# Patient Record
Sex: Female | Born: 1955 | Race: White | Hispanic: No | Marital: Married | Smoking: Never smoker
Health system: Southern US, Community
[De-identification: ages and names within clinical notes are randomized; demographics above are authoritative.]

## PROBLEM LIST (undated history)

## (undated) DIAGNOSIS — R7303 Prediabetes: Secondary | ICD-10-CM

## (undated) DIAGNOSIS — K5792 Diverticulitis of intestine, part unspecified, without perforation or abscess without bleeding: Secondary | ICD-10-CM

## (undated) DIAGNOSIS — H8101 Meniere's disease, right ear: Secondary | ICD-10-CM

## (undated) DIAGNOSIS — R0600 Dyspnea, unspecified: Secondary | ICD-10-CM

## (undated) DIAGNOSIS — I10 Essential (primary) hypertension: Secondary | ICD-10-CM

## (undated) DIAGNOSIS — F419 Anxiety disorder, unspecified: Secondary | ICD-10-CM

## (undated) DIAGNOSIS — J189 Pneumonia, unspecified organism: Secondary | ICD-10-CM

## (undated) DIAGNOSIS — M1990 Unspecified osteoarthritis, unspecified site: Secondary | ICD-10-CM

## (undated) DIAGNOSIS — I499 Cardiac arrhythmia, unspecified: Secondary | ICD-10-CM

## (undated) HISTORY — PX: ABDOMINAL HYSTERECTOMY: SHX81

## (undated) HISTORY — PX: URETHRAL DILATION: SUR417

## (undated) HISTORY — PX: DIAGNOSTIC LAPAROSCOPY: SUR761

## (undated) HISTORY — PX: TONSILLECTOMY: SUR1361

## (undated) HISTORY — PX: APPENDECTOMY: SHX54

---

## 1999-04-11 ENCOUNTER — Other Ambulatory Visit: Admission: RE | Admit: 1999-04-11 | Discharge: 1999-04-11 | Payer: Self-pay | Admitting: *Deleted

## 1999-04-18 ENCOUNTER — Encounter: Admission: RE | Admit: 1999-04-18 | Discharge: 1999-04-18 | Payer: Self-pay | Admitting: Family Medicine

## 1999-04-18 ENCOUNTER — Encounter: Payer: Self-pay | Admitting: Family Medicine

## 2000-04-10 ENCOUNTER — Other Ambulatory Visit: Admission: RE | Admit: 2000-04-10 | Discharge: 2000-04-10 | Payer: Self-pay | Admitting: *Deleted

## 2000-11-04 ENCOUNTER — Encounter: Payer: Self-pay | Admitting: Family Medicine

## 2000-11-04 ENCOUNTER — Encounter: Admission: RE | Admit: 2000-11-04 | Discharge: 2000-11-04 | Payer: Self-pay | Admitting: Family Medicine

## 2002-12-23 ENCOUNTER — Encounter: Payer: Self-pay | Admitting: Gynecology

## 2002-12-23 ENCOUNTER — Other Ambulatory Visit: Admission: RE | Admit: 2002-12-23 | Discharge: 2002-12-23 | Payer: Self-pay | Admitting: Gynecology

## 2002-12-23 ENCOUNTER — Encounter: Admission: RE | Admit: 2002-12-23 | Discharge: 2002-12-23 | Payer: Self-pay | Admitting: Gynecology

## 2004-09-11 ENCOUNTER — Other Ambulatory Visit: Admission: RE | Admit: 2004-09-11 | Discharge: 2004-09-11 | Payer: Self-pay | Admitting: Gynecology

## 2005-01-07 ENCOUNTER — Ambulatory Visit: Payer: Self-pay | Admitting: Family Medicine

## 2005-11-14 ENCOUNTER — Ambulatory Visit: Payer: Self-pay | Admitting: Internal Medicine

## 2006-01-05 ENCOUNTER — Ambulatory Visit: Payer: Self-pay | Admitting: Family Medicine

## 2006-07-28 ENCOUNTER — Ambulatory Visit: Payer: Self-pay | Admitting: Family Medicine

## 2006-08-24 ENCOUNTER — Other Ambulatory Visit: Admission: RE | Admit: 2006-08-24 | Discharge: 2006-08-24 | Payer: Self-pay | Admitting: Gynecology

## 2007-03-16 ENCOUNTER — Telehealth: Payer: Self-pay | Admitting: Family Medicine

## 2007-04-02 ENCOUNTER — Telehealth (INDEPENDENT_AMBULATORY_CARE_PROVIDER_SITE_OTHER): Payer: Self-pay | Admitting: *Deleted

## 2007-10-13 ENCOUNTER — Encounter: Payer: Self-pay | Admitting: Internal Medicine

## 2007-10-20 ENCOUNTER — Encounter: Payer: Self-pay | Admitting: Internal Medicine

## 2007-10-22 ENCOUNTER — Other Ambulatory Visit: Admission: RE | Admit: 2007-10-22 | Discharge: 2007-10-22 | Payer: Self-pay | Admitting: Gynecology

## 2013-10-17 ENCOUNTER — Other Ambulatory Visit: Payer: Self-pay

## 2013-10-17 DIAGNOSIS — Z1231 Encounter for screening mammogram for malignant neoplasm of breast: Secondary | ICD-10-CM

## 2013-10-31 ENCOUNTER — Ambulatory Visit
Admission: RE | Admit: 2013-10-31 | Discharge: 2013-10-31 | Disposition: A | Discharge status: Home / Self Care | Payer: BC Managed Care – PPO | Source: Ambulatory Visit

## 2013-10-31 DIAGNOSIS — Z1231 Encounter for screening mammogram for malignant neoplasm of breast: Secondary | ICD-10-CM

## 2015-06-06 ENCOUNTER — Emergency Department (HOSPITAL_COMMUNITY): Payer: Managed Care, Other (non HMO)

## 2015-06-06 ENCOUNTER — Observation Stay (HOSPITAL_COMMUNITY)
Admission: EM | Admit: 2015-06-06 | Discharge: 2015-06-06 | Disposition: A | Discharge status: Home / Self Care | Payer: Managed Care, Other (non HMO) | Attending: Obstetrics and Gynecology | Admitting: Obstetrics and Gynecology

## 2015-06-06 ENCOUNTER — Encounter (HOSPITAL_COMMUNITY): Payer: Self-pay | Admitting: Neurology

## 2015-06-06 DIAGNOSIS — Z7951 Long term (current) use of inhaled steroids: Secondary | ICD-10-CM | POA: Diagnosis not present

## 2015-06-06 DIAGNOSIS — R7309 Other abnormal glucose: Secondary | ICD-10-CM | POA: Insufficient documentation

## 2015-06-06 DIAGNOSIS — Z79899 Other long term (current) drug therapy: Secondary | ICD-10-CM | POA: Insufficient documentation

## 2015-06-06 DIAGNOSIS — K219 Gastro-esophageal reflux disease without esophagitis: Secondary | ICD-10-CM | POA: Diagnosis not present

## 2015-06-06 DIAGNOSIS — Z7982 Long term (current) use of aspirin: Secondary | ICD-10-CM | POA: Insufficient documentation

## 2015-06-06 DIAGNOSIS — R079 Chest pain, unspecified: Secondary | ICD-10-CM | POA: Diagnosis not present

## 2015-06-06 DIAGNOSIS — E7800 Pure hypercholesterolemia, unspecified: Secondary | ICD-10-CM | POA: Insufficient documentation

## 2015-06-06 DIAGNOSIS — K210 Gastro-esophageal reflux disease with esophagitis: Secondary | ICD-10-CM

## 2015-06-06 DIAGNOSIS — M940 Chondrocostal junction syndrome [Tietze]: Secondary | ICD-10-CM | POA: Diagnosis not present

## 2015-06-06 LAB — CBC WITH DIFFERENTIAL/PLATELET
Basophils Absolute: 0 10*3/uL (ref 0.0–0.1)
Basophils Relative: 1 %
Eosinophils Absolute: 0.1 10*3/uL (ref 0.0–0.7)
Eosinophils Relative: 2 %
HCT: 41.8 % (ref 36.0–46.0)
HEMOGLOBIN: 14.3 g/dL (ref 12.0–15.0)
LYMPHS ABS: 2.2 10*3/uL (ref 0.7–4.0)
LYMPHS PCT: 43 %
MCH: 30.4 pg (ref 26.0–34.0)
MCHC: 34.2 g/dL (ref 30.0–36.0)
MCV: 88.9 fL (ref 78.0–100.0)
MONO ABS: 0.3 10*3/uL (ref 0.1–1.0)
MONOS PCT: 6 %
NEUTROS PCT: 48 %
Neutro Abs: 2.4 10*3/uL (ref 1.7–7.7)
Platelets: 232 10*3/uL (ref 150–400)
RBC: 4.7 MIL/uL (ref 3.87–5.11)
RDW: 13.2 % (ref 11.5–15.5)
WBC: 5 10*3/uL (ref 4.0–10.5)

## 2015-06-06 LAB — COMPREHENSIVE METABOLIC PANEL
ALK PHOS: 64 U/L (ref 38–126)
ALT: 29 U/L (ref 14–54)
ANION GAP: 11 (ref 5–15)
AST: 26 U/L (ref 15–41)
Albumin: 3.9 g/dL (ref 3.5–5.0)
BILIRUBIN TOTAL: 0.7 mg/dL (ref 0.3–1.2)
BUN: 12 mg/dL (ref 6–20)
CALCIUM: 9.3 mg/dL (ref 8.9–10.3)
CO2: 25 mmol/L (ref 22–32)
Chloride: 103 mmol/L (ref 101–111)
Creatinine, Ser: 0.88 mg/dL (ref 0.44–1.00)
GFR calc Af Amer: >60 mL/min (ref >60)
GLUCOSE: 103 mg/dL — AB (ref 65–99)
POTASSIUM: 4.5 mmol/L (ref 3.5–5.1)
Sodium: 139 mmol/L (ref 135–145)
TOTAL PROTEIN: 6.8 g/dL (ref 6.5–8.1)

## 2015-06-06 LAB — I-STAT TROPONIN, ED
TROPONIN I, POC: 0 ng/mL (ref 0.00–0.08)
TROPONIN I, POC: 0 ng/mL (ref 0.00–0.08)

## 2015-06-06 MED ORDER — NITROGLYCERIN 0.4 MG SL SUBL
0.4000 mg | SUBLINGUAL_TABLET | SUBLINGUAL | Status: DC | PRN
  Filled 2015-06-06: qty 1

## 2015-06-06 MED ORDER — ASPIRIN 81 MG PO CHEW
324.0000 mg | CHEWABLE_TABLET | Freq: Once | ORAL | Status: AC
  Administered 2015-06-06: 324 mg via ORAL
  Filled 2015-06-06: qty 4

## 2015-06-06 NOTE — Consult Note (Signed)
[facility] Medical Consultation  Dawn Haley RUE:[id number] DOB: 1955/10/14 DOA: 06/06/2015 PCP: No primary care provider on file.   Requesting physician: Dr Dalene Seltzer - [facility] Date of consultation: 06/06/15 Reason for consultation: CP  Impression/Recommendations Active Problems:   Chest pain    CP: Suspect musculoskeletal etiology. Heart score 2. Pain is exactly reproducible on my palpation of chest wall. There may be some underlying additional GI component given worsening of symptoms after coffee use with endorse history of reflux. Suspect that this morning's episode may have caused esophageal irritation with spasm. Patient to avoid caffeine use. Patient can consider using Zantac or Prilosec for symptomatically relief. Additionally patient sleeps on her side which may also caused costochondritis leading to her current chest wall pain. Patient with family history of heart disease in father and heart attack and stroke. Husband sees cardiologist and patient has agreed to follow-up with cardiology in 1-2 weeks for further management. Patient stable and safe for discharge. While NSAIDs may make GERD worse pt can consider using ibuprofen 600 Q6 for 7 days for costochondral chest pain.  GERD: Intermittent reflux. Worsened by caffeine use. Patient to use H2 blocker or PPI for relief. May consider getting GI cocktail prior to leaving the emergency room.  I will followup again tomorrow. Please contact me if I can be of assistance in the meanwhile. Thank you for this consultation.  Chief Complaint: Chest pain  HPI:  Chest pain. Episodic. Last less than 1 minute. Started shortly after drinking coffee this morning which patient typically tries to avoid due to this causing reflux. Pain was in the left chest wall with radiation to her jaw. Episodes would come on at random. Nonexertional. Nothing makes the symptoms better. Patient sleeps on her left side. Denies any recent change in exercise habits.  Denies any history of significant hypercholesterolemia other than slight elevation with recommended dietary and exercise changes by primary care physician. Denies any recent fevers, nausea, vomiting, URI symptoms including runny nose cough congestion sore throat. Denies dysuria frequency back pain.  Review of Systems:  PEr HPi w/ all other systems negative.    History reviewed. No pertinent past medical history. Past Surgical History  Procedure Laterality Date  . Abdominal hysterectomy     Social History:  reports that she has never smoked. She does not have any smokeless tobacco history on file. She reports that she drinks alcohol. Her drug history is not on file.  Allergies  Allergen Reactions  . Codeine Hives, Rash and Other (See Comments)    Intensifies pain instead of relieving   Family History  Problem Relation Age of Onset  . Heart disease Father   . Stroke Father     Prior to Admission medications   Medication Sig Start Date End Date Taking? Authorizing Provider  ASHWAGANDHA PO Take 1 Dose by mouth daily.   Yes Historical Provider, MD  aspirin EC 81 MG tablet Take 324 mg by mouth once.   Yes Historical Provider, MD  cholecalciferol (VITAMIN D) 1000 units tablet Take 1,000 Units by mouth daily.   Yes Historical Provider, MD  fluticasone (FLONASE) 50 MCG/ACT nasal spray Place into both nostrils daily.   Yes Historical Provider, MD  GARLIC PO Take 1 Dose by mouth daily.   Yes Historical Provider, MD  LORazepam (ATIVAN) 0.5 MG tablet Take 0.5 mg by mouth at bedtime as needed for sleep.  05/30/15  Yes Historical Provider, MD  Multiple Vitamin (MULTIVITAMIN WITH MINERALS) TABS tablet Take 1 tablet by  mouth daily.   Yes Historical Provider, MD  Omega-3 Fatty Acids (FISH OIL) 1000 MG CAPS Take 1,000 mg by mouth daily.   Yes Historical Provider, MD  saccharomyces boulardii (FLORASTOR) 250 MG capsule Take 250 mg by mouth daily.   Yes Historical Provider, MD   Physical Exam: Blood  pressure 125/67, pulse 65, temperature 98.4 F (36.9 C), temperature source Oral, resp. rate 13, SpO2 97 %. Filed Vitals:   06/06/15 1430 06/06/15 1445  BP: 138/73 125/67  Pulse: 71 65  Temp:    Resp: 13 13   Physical Exam  Constitutional: oriented to person, place, and time. appears well-developed and well-nourished. No distress.  HENT:  Head: Normocephalic and atraumatic.  Eyes: EOMI. PERRL.  Neck: Normal range of motion.  Cardiovascular: RRR, no m/r/g, 2+ distal pulses, reproducible CP on palpation of L chest wall in costochondral border region Pulmonary/Chest: Effort normal and breath sounds normal. No respiratory distress.  Abdominal: Soft. Bowel sounds are normal. NonTTP, no distension.  Musculoskeletal: Normal range of motion. Non ttp, no effusion.  Neurological: alert and oriented to person, place, and time.  Skin: Skin is warm. No rash noted. non diaphoretic.  Psychiatric: normal mood and affect. behavior is normal. Judgment and thought content normal.    Labs on Admission:  Basic Metabolic Panel:  Recent Labs Lab 06/06/15 0953  NA 139  K 4.5  CL 103  CO2 25  GLUCOSE 103*  BUN 12  CREATININE 0.88  CALCIUM 9.3   Liver Function Tests:  Recent Labs Lab 06/06/15 0953  AST 26  ALT 29  ALKPHOS 64  BILITOT 0.7  PROT 6.8  ALBUMIN 3.9   No results for input(s): LIPASE, AMYLASE in the last 168 hours. No results for input(s): AMMONIA in the last 168 hours. CBC:  Recent Labs Lab 06/06/15 0953  WBC 5.0  NEUTROABS 2.4  HGB 14.3  HCT 41.8  MCV 88.9  PLT 232   Cardiac Enzymes: No results for input(s): CKTOTAL, CKMB, CKMBINDEX, TROPONINI in the last 168 hours. BNP: Invalid input(s): POCBNP CBG: No results for input(s): GLUCAP in the last 168 hours.  Radiological Exams on Admission: Dg Chest 2 View  06/06/2015  CLINICAL DATA:  Left-sided chest pain, elbow pain, left on tingling and lightheadedness EXAM: CHEST  2 VIEW COMPARISON:  None FINDINGS: There  is a focal opacity in the lingula. There is no focal consolidation. There is no pleural effusion or pneumothorax. The heart and mediastinal contours are unremarkable. The osseous structures are unremarkable. IMPRESSION: Focal opacity in the lingula which is likely a confluence of structures, but a soft tissue lung nodule cannot be entirely excluded. Recommend follow-up chest x-ray in 6-8 weeks. Electronically Signed   By: Elige Ko   On: 06/06/2015 10:04    EKG: Independently reviewed.  Normal sinus rhythm,  Manju Kulkarni J [facility]   If 7PM-7AM, please contact night-coverage [email or web address] Password [email or web address] 06/06/2015, 4:00 PM

## 2015-06-06 NOTE — ED Notes (Signed)
Nitro offered pt refused

## 2015-06-06 NOTE — ED Notes (Signed)
Pt reports onset of left sided cp, tingling in her left jaw, and pressure between shoulder blades while standing in her kitchen. Denies cardiac hx .

## 2015-06-06 NOTE — ED Provider Notes (Signed)
CSN: [id number]     Arrival date & time 06/06/15  4098 History   First MD Initiated Contact with Patient 06/06/15 [phone]     Chief Complaint  Patient presents with  . Chest Pain     (Consider location/radiation/quality/duration/timing/severity/associated sxs/prior Treatment) HPI Comments: BMI 30.1 Father MI not sure if greater than or less tahn 55 Borderline choleseterol Borderline DM Under a lot of stress recently, mom died 2 weeks ago   Patient is a 60 y.o. female presenting with chest pain.  Chest Pain Pain location:  L chest Pain quality comment:  "poking" like pushing in with finger, spot upper chest, would radiate and lower spot with radiation Pain radiates to:  L jaw, L arm and L shoulder (left arm tingling) Pain severity:  Mild Onset quality:  Sudden Duration:  2 hours Timing:  Intermittent Progression:  Waxing and waning Chronicity:  New Ineffective treatments: not exertional. Associated symptoms: no abdominal pain, no back pain, no cough, no diaphoresis, no fever, no headache, no nausea, no numbness, no shortness of breath, no syncope, not vomiting and no weakness   Risk factors: high cholesterol   Risk factors: no coronary artery disease (pre-DM), no diabetes mellitus, no hypertension, no immobilization, no prior DVT/PE and no smoking   Risk factors comment:  Father MI 58s   History reviewed. No pertinent past medical history. Past Surgical History  Procedure Laterality Date  . Abdominal hysterectomy     Family History  Problem Relation Age of Onset  . Heart disease Father   . Stroke Father    Social History  Substance Use Topics  . Smoking status: Never Smoker   . Smokeless tobacco: None  . Alcohol Use: Yes   OB History    No data available     Review of Systems  Constitutional: Negative for fever and diaphoresis.  HENT: Negative for sore throat.   Eyes: Negative for visual disturbance.  Respiratory: Negative for cough and shortness of breath.    Cardiovascular: Positive for chest pain. Negative for leg swelling and syncope.  Gastrointestinal: Negative for nausea, vomiting and abdominal pain.  Genitourinary: Negative for difficulty urinating.  Musculoskeletal: Negative for back pain and neck pain.  Skin: Negative for rash.  Neurological: Negative for syncope, weakness, numbness and headaches.      Allergies  Codeine  Home Medications   Prior to Admission medications   Medication Sig Start Date End Date Taking? Authorizing Provider  ASHWAGANDHA PO Take 1 Dose by mouth daily.   Yes Historical Provider, MD  aspirin EC 81 MG tablet Take 324 mg by mouth once.   Yes Historical Provider, MD  cholecalciferol (VITAMIN D) 1000 units tablet Take 1,000 Units by mouth daily.   Yes Historical Provider, MD  fluticasone (FLONASE) 50 MCG/ACT nasal spray Place into both nostrils daily.   Yes Historical Provider, MD  GARLIC PO Take 1 Dose by mouth daily.   Yes Historical Provider, MD  LORazepam (ATIVAN) 0.5 MG tablet Take 0.5 mg by mouth at bedtime as needed for sleep.  05/30/15  Yes Historical Provider, MD  Multiple Vitamin (MULTIVITAMIN WITH MINERALS) TABS tablet Take 1 tablet by mouth daily.   Yes Historical Provider, MD  Omega-3 Fatty Acids (FISH OIL) 1000 MG CAPS Take 1,000 mg by mouth daily.   Yes Historical Provider, MD  saccharomyces boulardii (FLORASTOR) 250 MG capsule Take 250 mg by mouth daily.   Yes Historical Provider, MD   BP 104/80 mmHg  Pulse 63  Temp(Src) 98.4 F (  36.9 C) (Oral)  Resp 14  SpO2 98% Physical Exam  Constitutional: She is oriented to person, place, and time. She appears well-developed and well-nourished. No distress.  HENT:  Head: Normocephalic and atraumatic.  Eyes: Conjunctivae and EOM are normal.  Neck: Normal range of motion.  Cardiovascular: Normal rate, regular rhythm, normal heart sounds and intact distal pulses.  Exam reveals no gallop and no friction rub.   No murmur heard. Pulmonary/Chest: Effort  normal and breath sounds normal. No respiratory distress. She has no wheezes. She has no rales. She exhibits tenderness.  Abdominal: Soft. She exhibits no distension. There is no tenderness. There is no guarding.  Musculoskeletal: She exhibits no edema or tenderness.  Neurological: She is alert and oriented to person, place, and time.  Skin: Skin is warm and dry. No rash noted. She is not diaphoretic. No erythema.  Nursing note and vitals reviewed.   ED Course  Procedures (including critical care time) Labs Review Labs Reviewed  COMPREHENSIVE METABOLIC PANEL - Abnormal; Notable for the following:    Glucose, Bld 103 (*)    All other components within normal limits  CBC WITH DIFFERENTIAL/PLATELET  I-STAT TROPOININ, ED  Rosezena Sensor, ED    Imaging Review Dg Chest 2 View  06/06/2015  CLINICAL DATA:  Left-sided chest pain, elbow pain, left on tingling and lightheadedness EXAM: CHEST  2 VIEW COMPARISON:  None FINDINGS: There is a focal opacity in the lingula. There is no focal consolidation. There is no pleural effusion or pneumothorax. The heart and mediastinal contours are unremarkable. The osseous structures are unremarkable. IMPRESSION: Focal opacity in the lingula which is likely a confluence of structures, but a soft tissue lung nodule cannot be entirely excluded. Recommend follow-up chest x-ray in 6-8 weeks. Electronically Signed   By: Elige Ko   On: 06/06/2015 10:04   I have personally reviewed and evaluated these images and lab results as part of my medical decision-making.   EKG Interpretation   Date/Time:  Wednesday June 06 2015 09:24:11 EST Ventricular Rate:  66 PR Interval:  120 QRS Duration: 88 QT Interval:  400 QTC Calculation: 419 R Axis:   65 Text Interpretation:  Normal sinus rhythm Normal ECG No previous ECGs  available Confirmed by [facility] MD, Garen Woolbright ([id number]) on 06/06/2015 9:29:11 AM      MDM   Final diagnoses:  Chest pain, unspecified chest pain  type   60 year old female with history of hypercholesterolemia, family hx CAD, presents with concern of chest pain. Differential diagnosis for chest pain includes pulmonary embolus, dissection, pneumothorax, pneumonia, ACS, myocarditis, pericarditis.  EKG was done and evaluate by me and showed no acute ST changes and no signs of pericarditis. Chest x-ray was done and evaluated by me and radiology and showed no sign of pneumonia or pneumothorax. Patient without dyspnea, is low risk Wells and have low suspicion for PE.  Patient with borderline HEART score and had delta troponins which were both negative.  Given this evaluation, history and physical have low suspicion for pulmonary embolus, pneumonia, ACS, myocarditis, pericarditis, dissection.   Consulted hospitalist for admission, given on my evaluation patient with elevated heart score. Hospitalist evaluation and felt symptoms were unlikely cardiac and calculated lower risk heart score.  Discussed with patient who feels comfortable with discharge and close Cardiology follow up and return if symptoms return. Patient discharged in stable condition with understanding of reasons to return.       Alvira Monday, MD 06/06/15 [phone]

## 2015-06-06 NOTE — ED Notes (Signed)
Attempted report 

## 2015-07-23 ENCOUNTER — Ambulatory Visit: Payer: Managed Care, Other (non HMO) | Admitting: Interventional Cardiology

## 2015-10-22 ENCOUNTER — Other Ambulatory Visit: Payer: Self-pay | Admitting: Orthopedic Surgery

## 2015-10-29 ENCOUNTER — Encounter (HOSPITAL_BASED_OUTPATIENT_CLINIC_OR_DEPARTMENT_OTHER): Payer: Self-pay | Admitting: *Deleted

## 2015-11-02 ENCOUNTER — Ambulatory Visit (HOSPITAL_BASED_OUTPATIENT_CLINIC_OR_DEPARTMENT_OTHER)
Admission: RE | Admit: 2015-11-02 | Discharge: 2015-11-02 | Disposition: A | Discharge status: Home / Self Care | Payer: Managed Care, Other (non HMO) | Source: Ambulatory Visit | Attending: Orthopedic Surgery | Admitting: Orthopedic Surgery

## 2015-11-02 ENCOUNTER — Encounter (HOSPITAL_BASED_OUTPATIENT_CLINIC_OR_DEPARTMENT_OTHER): Payer: Self-pay | Admitting: Anesthesiology

## 2015-11-02 ENCOUNTER — Encounter (HOSPITAL_BASED_OUTPATIENT_CLINIC_OR_DEPARTMENT_OTHER)
Admission: RE | Disposition: A | Discharge status: Home / Self Care | Payer: Self-pay | Source: Ambulatory Visit | Attending: Orthopedic Surgery

## 2015-11-02 ENCOUNTER — Ambulatory Visit (HOSPITAL_BASED_OUTPATIENT_CLINIC_OR_DEPARTMENT_OTHER): Payer: Managed Care, Other (non HMO) | Admitting: Anesthesiology

## 2015-11-02 DIAGNOSIS — M67441 Ganglion, right hand: Secondary | ICD-10-CM | POA: Insufficient documentation

## 2015-11-02 DIAGNOSIS — M151 Heberden's nodes (with arthropathy): Secondary | ICD-10-CM | POA: Insufficient documentation

## 2015-11-02 HISTORY — PX: CYST EXCISION: SHX5701

## 2015-11-02 SURGERY — CYST REMOVAL
Anesthesia: Monitor Anesthesia Care | Site: Finger | Laterality: Right

## 2015-11-02 MED ORDER — LIDOCAINE HCL (PF) 0.5 % IJ SOLN
INTRAMUSCULAR | Status: AC
  Filled 2015-11-02: qty 50

## 2015-11-02 MED ORDER — CEFAZOLIN SODIUM-DEXTROSE 2-4 GM/100ML-% IV SOLN
2.0000 g | INTRAVENOUS | Status: AC
  Administered 2015-11-02: 2 g via INTRAVENOUS

## 2015-11-02 MED ORDER — LACTATED RINGERS IV SOLN
INTRAVENOUS | Status: DC
  Administered 2015-11-02: 14:00:00 via INTRAVENOUS

## 2015-11-02 MED ORDER — BUPIVACAINE HCL (PF) 0.25 % IJ SOLN
INTRAMUSCULAR | Status: AC
  Filled 2015-11-02: qty 30

## 2015-11-02 MED ORDER — MIDAZOLAM HCL 2 MG/2ML IJ SOLN
INTRAMUSCULAR | Status: AC
  Filled 2015-11-02: qty 2

## 2015-11-02 MED ORDER — MIDAZOLAM HCL 2 MG/2ML IJ SOLN
1.0000 mg | INTRAMUSCULAR | Status: DC | PRN
  Administered 2015-11-02 (×2): 1 mg via INTRAVENOUS

## 2015-11-02 MED ORDER — TRAMADOL HCL 50 MG PO TABS: ORAL_TABLET | ORAL | Status: DC

## 2015-11-02 MED ORDER — FENTANYL CITRATE (PF) 100 MCG/2ML IJ SOLN
50.0000 ug | INTRAMUSCULAR | Status: DC | PRN
  Administered 2015-11-02 (×2): 50 ug via INTRAVENOUS

## 2015-11-02 MED ORDER — HYDROMORPHONE HCL 1 MG/ML IJ SOLN: 0.2500 mg | INTRAMUSCULAR | Status: DC | PRN

## 2015-11-02 MED ORDER — ONDANSETRON HCL 4 MG/2ML IJ SOLN
INTRAMUSCULAR | Status: DC | PRN
  Administered 2015-11-02: 4 mg via INTRAVENOUS

## 2015-11-02 MED ORDER — BUPIVACAINE HCL (PF) 0.25 % IJ SOLN
INTRAMUSCULAR | Status: DC | PRN
  Administered 2015-11-02: 10 mL

## 2015-11-02 MED ORDER — CEFAZOLIN SODIUM-DEXTROSE 2-4 GM/100ML-% IV SOLN
INTRAVENOUS | Status: AC
  Filled 2015-11-02: qty 100

## 2015-11-02 MED ORDER — CHLORHEXIDINE GLUCONATE 4 % EX LIQD: 60.0000 mL | Freq: Once | CUTANEOUS | Status: DC

## 2015-11-02 MED ORDER — SCOPOLAMINE 1 MG/3DAYS TD PT72: 1.0000 | MEDICATED_PATCH | Freq: Once | TRANSDERMAL | Status: DC | PRN

## 2015-11-02 MED ORDER — PROPOFOL 500 MG/50ML IV EMUL
INTRAVENOUS | Status: DC | PRN
  Administered 2015-11-02: 50 ug/kg/min via INTRAVENOUS

## 2015-11-02 MED ORDER — GLYCOPYRROLATE 0.2 MG/ML IJ SOLN: 0.2000 mg | Freq: Once | INTRAMUSCULAR | Status: DC | PRN

## 2015-11-02 MED ORDER — FENTANYL CITRATE (PF) 100 MCG/2ML IJ SOLN
INTRAMUSCULAR | Status: AC
  Filled 2015-11-02: qty 2

## 2015-11-02 MED ORDER — LIDOCAINE 2% (20 MG/ML) 5 ML SYRINGE
INTRAMUSCULAR | Status: DC | PRN
  Administered 2015-11-02: 50 mg via INTRAVENOUS

## 2015-11-02 MED ORDER — PROMETHAZINE HCL 25 MG/ML IJ SOLN: 6.2500 mg | INTRAMUSCULAR | Status: DC | PRN

## 2015-11-02 SURGICAL SUPPLY — 48 items
BANDAGE ACE 3X5.8 VEL STRL LF (GAUZE/BANDAGES/DRESSINGS) ×3 IMPLANT
BENZOIN TINCTURE PRP APPL 2/3 (GAUZE/BANDAGES/DRESSINGS) IMPLANT
BLADE MINI RND TIP GREEN BEAV (BLADE) IMPLANT
BLADE SURG 15 STRL LF DISP TIS (BLADE) ×2 IMPLANT
BLADE SURG 15 STRL SS (BLADE) ×4
BNDG ELASTIC 2X5.8 VLCR STR LF (GAUZE/BANDAGES/DRESSINGS) IMPLANT
BNDG ESMARK 4X9 LF (GAUZE/BANDAGES/DRESSINGS) IMPLANT
BNDG GAUZE ELAST 4 BULKY (GAUZE/BANDAGES/DRESSINGS) IMPLANT
CHLORAPREP W/TINT 26ML (MISCELLANEOUS) ×3 IMPLANT
CLOSURE WOUND 1/2 X4 (GAUZE/BANDAGES/DRESSINGS)
CORDS BIPOLAR (ELECTRODE) ×3 IMPLANT
COVER BACK TABLE 60X90IN (DRAPES) ×3 IMPLANT
COVER MAYO STAND STRL (DRAPES) ×3 IMPLANT
CUFF TOURNIQUET SINGLE 18IN (TOURNIQUET CUFF) ×3 IMPLANT
DRAPE EXTREMITY T 121X128X90 (DRAPE) ×3 IMPLANT
DRAPE SURG 17X23 STRL (DRAPES) ×3 IMPLANT
DRSG PAD ABDOMINAL 8X10 ST (GAUZE/BANDAGES/DRESSINGS) IMPLANT
GAUZE SPONGE 4X4 12PLY STRL (GAUZE/BANDAGES/DRESSINGS) ×3 IMPLANT
GAUZE XEROFORM 1X8 LF (GAUZE/BANDAGES/DRESSINGS) ×3 IMPLANT
GLOVE BIO SURGEON STRL SZ7.5 (GLOVE) ×3 IMPLANT
GLOVE BIOGEL PI IND STRL 7.0 (GLOVE) ×1 IMPLANT
GLOVE BIOGEL PI IND STRL 8 (GLOVE) ×1 IMPLANT
GLOVE BIOGEL PI INDICATOR 7.0 (GLOVE) ×2
GLOVE BIOGEL PI INDICATOR 8 (GLOVE) ×2
GLOVE ECLIPSE 6.5 STRL STRAW (GLOVE) ×3 IMPLANT
GOWN STRL REUS W/ TWL LRG LVL3 (GOWN DISPOSABLE) ×1 IMPLANT
GOWN STRL REUS W/TWL LRG LVL3 (GOWN DISPOSABLE) ×2
GOWN STRL REUS W/TWL XL LVL3 (GOWN DISPOSABLE) ×3 IMPLANT
NEEDLE HYPO 25X1 1.5 SAFETY (NEEDLE) ×3 IMPLANT
NS IRRIG 1000ML POUR BTL (IV SOLUTION) ×3 IMPLANT
PACK BASIN DAY SURGERY FS (CUSTOM PROCEDURE TRAY) ×3 IMPLANT
PAD CAST 3X4 CTTN HI CHSV (CAST SUPPLIES) IMPLANT
PADDING CAST ABS 4INX4YD NS (CAST SUPPLIES) ×2
PADDING CAST ABS COTTON 4X4 ST (CAST SUPPLIES) ×1 IMPLANT
PADDING CAST COTTON 3X4 STRL (CAST SUPPLIES)
SPLINT FINGER 3.25 911903 (SOFTGOODS) ×3 IMPLANT
SPLINT PLASTER CAST XFAST 3X15 (CAST SUPPLIES) IMPLANT
SPLINT PLASTER XTRA FASTSET 3X (CAST SUPPLIES)
STOCKINETTE 4X48 STRL (DRAPES) IMPLANT
STRIP CLOSURE SKIN 1/2X4 (GAUZE/BANDAGES/DRESSINGS) IMPLANT
SUT ETHILON 4 0 PS 2 18 (SUTURE) ×3 IMPLANT
SUT MNCRL AB 4-0 PS2 18 (SUTURE) IMPLANT
SUT MON AB 5-0 PS2 18 (SUTURE) IMPLANT
SUT VIC AB 4-0 P2 18 (SUTURE) IMPLANT
SYR BULB 3OZ (MISCELLANEOUS) ×3 IMPLANT
SYR CONTROL 10ML LL (SYRINGE) ×3 IMPLANT
TOWEL OR 17X24 6PK STRL BLUE (TOWEL DISPOSABLE) ×6 IMPLANT
UNDERPAD 30X30 (UNDERPADS AND DIAPERS) ×3 IMPLANT

## 2015-11-02 NOTE — Op Note (Signed)
[id number] 

## 2015-11-02 NOTE — Op Note (Signed)
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  PROCEDURE:   1. Right index finger removal of mucoid cyst 2. Right index finger debridement of distal interphalangeal joint including osteophyte from dorsal radial aspect of middle phalanx condyles 3. Right index finger rotation flap less than 10 cm2 for skin coverage.  SURGEON:  Betha LoaKevin Geneal Huebert, MD  ASSISTANT:  None.  ANESTHESIA:  Bier block with sedation.  IV FLUIDS:  Per anesthesia flow sheet.  ESTIMATED BLOOD LOSS:  Minimal.  COMPLICATIONS:  None.  SPECIMENS:  None.  TOURNIQUET TIME:  37 minutes.  DISPOSITION:  Stable to PACU.  INDICATIONS:  Ms. Dawn Haley is a 60 year old female with a mass in her right index finger for several months.  This is bothersome to her.  She wishes to have it removed.  Risks, benefits, and alternatives of surgery were discussed including risk of blood loss; infection; damage to nerves, vessels, tendons, ligaments, bone; failure of surgery; need for additional surgery; complications with wound healing; continued pain; and recurrence of the mass.  We also discussed possible need for rotation flap for skin coverage.  She voiced understanding of the risks and elected to proceed.  OPERATIVE COURSE:  After being identified preoperatively by myself, the patient and I agreed upon procedure and site of procedure.  Surgical site was marked.  The risks, benefits, and alternatives of surgery were reviewed and she wished to proceed.  Surgical consent  had been signed. She was given IV Ancef as preoperative antibiotic prophylaxis.  She was transported to the operating room and placed on the operating room table in supine position with the right upper extremity on arm board.  Bier block anesthesia was induced by anesthesiologist.  Right upper extremity was prepped and draped in normal sterile orthopedic fashion.  Surgical pause was performed between surgeons, anesthesia, and operating room staff, and all were in agreement as to the patient, procedure, site of procedure.  Tourniquet at the proximal aspect of the forearm had been inflated for the Bier block.  A hockey stick shaped incision was made at the dorsal radial aspect of the index finger.  This was carried into subcutaneous tissues by spreading technique.  Bipolar electrocautery was used to obtain hemostasis.  Inflow and outflow to the skin flap were maintained as best possible.  The mass was identified.  It was very adherent to the skin.  The overlying skin was very thinned and broken through it easily.  It was felt that a rotation flap would be appropriate with removal of the mass including the overlying skin.  The DIP joint was underneath the extensor tendon.  There was an osteophyte at the dorsal and radial aspect  of the condyles of the middle phalanx. This was taken down with the rongeurs.  The joint was debrided with synovectomy rongeurs.  The wound was copiously irrigated with sterile saline.  The incision was then extended proximally and then back transversely distal to the PIP joint.  The flap was mobilized.  The mass at the distal aspect of the finger was removed including the skin to allow better positioning of the skin flap.  The mass was approximately 4 mm in diameter.  The skin flap was then mobilized and placed into the defect.  It was secured with 4-0 nylon suture in a horizontal mattress fashion.  All of the skin edges were able to be reapproximated.  A digital  block had been performed with 10 mL of 0.25% plain Marcaine to aid in postoperative analgesia.  The wound was then dressed with sterile Xeroform, 4x4s, and wrapped with a Coban dressing lightly.  An Alumafoam splint was placed, wrapped lightly with Coban dressing.  Tourniquet was deflated at 37 minutes.  Fingertips were pink with brisk capillary refill after deflation of the tourniquet.  Operative drapes were broken down.  The patient was awoken from anesthesia safely.  She was transferred back to the stretcher and taken to PACU in stable condition. I will see her back in the office in 1 week for postoperative followup. I will give her tramadol 50 mg 1 to 2 p.o. q.6 hours p.r.n. pain, dispensed #20.  She states she has not taken tramadol in the past.  She has had issues with codeine causing itching and rash.  She is going to try ibuprofen and Tylenol only and will use the tramadol if she is unable to gain adequate pain control.  She agrees with the plan of care.     Betha LoaKevin Viha Kriegel, MD     KK/MEDQ  D:  11/02/2015  T:  11/02/2015  Job:  [id number]

## 2015-11-02 NOTE — Anesthesia Preprocedure Evaluation (Addendum)
Anesthesia Evaluation  Patient identified by MRN, date of birth, ID band Patient awake    Reviewed: Allergy & Precautions, NPO status   Airway Mallampati: II  TM Distance: >3 FB Neck ROM: Full  Mouth opening: Limited Mouth Opening  Dental  (+) Teeth Intact   Pulmonary neg pulmonary ROS,    breath sounds clear to auscultation       Cardiovascular negative cardio ROS   Rhythm:Regular Rate:Normal     Neuro/Psych negative neurological ROS     GI/Hepatic Neg liver ROS, GERD  ,  Endo/Other  negative endocrine ROS  Renal/GU negative Renal ROS     Musculoskeletal   Abdominal   Peds  Hematology   Anesthesia Other Findings   Reproductive/Obstetrics                            Anesthesia Physical Anesthesia Plan  ASA: II  Anesthesia Plan: MAC and Bier Block   Post-op Pain Management:    Induction: Intravenous  Airway Management Planned: LMA, Natural Airway and Simple Face Mask  Additional Equipment:   Intra-op Plan:   Post-operative Plan: Extubation in OR  Informed Consent: I have reviewed the patients History and Physical, chart, labs and discussed the procedure including the risks, benefits and alternatives for the proposed anesthesia with the patient or authorized representative who has indicated his/her understanding and acceptance.   Dental advisory given  Plan Discussed with: CRNA  Anesthesia Plan Comments:        Anesthesia Quick Evaluation

## 2015-11-02 NOTE — H&P (Signed)
  Dawn Haley is an 60 y.o. female.   Chief Complaint: right index mucoid cyst HPI: 60 yo rhd female with mass on right index finger x 7 months.  Occasionally will break and drain.  She wishes to have it removed and the dip joint debrided to try to prevent recurrence.  Allergies:  Allergies  Allergen Reactions  . Codeine Hives, Rash and Other (See Comments)    Intensifies pain instead of relieving  . Pentothal [Thiopental] Rash    History reviewed. No pertinent past medical history.  Past Surgical History  Procedure Laterality Date  . Abdominal hysterectomy    . Appendectomy    . Tonsillectomy    . Diagnostic laparoscopy    . Urethral dilation      Family History: Family History  Problem Relation Age of Onset  . Heart disease Father   . Stroke Father     Social History:   reports that she has never smoked. She does not have any smokeless tobacco history on file. She reports that she drinks alcohol. Her drug history is not on file.  Medications: Medications Prior to Admission  Medication Sig Dispense Refill  . ASHWAGANDHA PO Take 1 Dose by mouth daily.    . cholecalciferol (VITAMIN D) 1000 units tablet Take 1,000 Units by mouth daily.    . fluticasone (FLONASE) 50 MCG/ACT nasal spray Place into both nostrils daily.    Marland Kitchen. GARLIC PO Take 1 Dose by mouth daily.    Marland Kitchen. LORazepam (ATIVAN) 0.5 MG tablet Take 0.5 mg by mouth at bedtime as needed for sleep.   0  . Multiple Vitamin (MULTIVITAMIN WITH MINERALS) TABS tablet Take 1 tablet by mouth daily.    . Omega-3 Fatty Acids (FISH OIL) 1000 MG CAPS Take 1,000 mg by mouth daily.    Marland Kitchen. saccharomyces boulardii (FLORASTOR) 250 MG capsule Take 250 mg by mouth daily.      No results found for this or any previous visit (from the past 48 hour(s)).  No results found.   A comprehensive review of systems was negative.  Blood pressure 125/63, pulse 61, temperature 98.2 F (36.8 C), temperature source Oral, resp. rate 18, height 5'  10" (1.778 m), weight 95.709 kg (211 lb), SpO2 99 %.  General appearance: alert, cooperative and appears stated age Head: Normocephalic, without obvious abnormality, atraumatic Neck: supple, symmetrical, trachea midline Resp: clear to auscultation bilaterally Cardio: regular rate and rhythm GI: non-tender Extremities: Intact sensation and capillary refill all digits.  +epl/fpl/io.  No wounds.  Pulses: 2+ and symmetric Skin: Skin color, texture, turgor normal. No rashes or lesions Neurologic: Grossly normal Incision/Wound:none  Assessment/Plan Right index finger mucoid cyst.  Non operative and operative treatment options were discussed with the patient and patient wishes to proceed with operative treatment. Risks, benefits, and alternatives of surgery were discussed and the patient agrees with the plan of care.   Kiyara Bouffard R 11/02/2015, 1:37 PM

## 2015-11-02 NOTE — Transfer of Care (Signed)
Immediate Anesthesia Transfer of Care Note  Patient: Dawn CassisRuth W Haley  Procedure(s) Performed: Procedure(s): RIGHT INDEX EXCISION MASS  (Right)  Patient Location: PACU  Anesthesia Type:Bier block  Level of Consciousness: awake, alert , oriented and patient cooperative  Airway & Oxygen Therapy: Patient Spontanous Breathing and Patient connected to face mask oxygen  Post-op Assessment: Report given to RN and Post -op Vital signs reviewed and stable  Post vital signs: Reviewed and stable  Last Vitals:  Filed Vitals:   11/02/15 1211  BP: 125/63  Pulse: 61  Temp: 36.8 C  Resp: 18    Last Pain: There were no vitals filed for this visit.    Patients Stated Pain Goal: 0 (11/02/15 1211)  Complications: No apparent anesthesia complications

## 2015-11-02 NOTE — Brief Op Note (Signed)
11/02/2015  2:31 PM  PATIENT:  Karn Cassisuth W Mckay  60 y.o. 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   PROCEDURE:  Right index excision mucoid cyst, debridement dip joint, rotation flap  SURGEON:  Surgeon(s) and Role:    * Betha LoaKevin Shiheem Corporan, MD - Primary  PHYSICIAN ASSISTANT:   ASSISTANTS: none   ANESTHESIA:   [phone]  EBL:     BLOOD ADMINISTERED:Bier block with sedation  DRAINS: none   LOCAL MEDICATIONS USED:  MARCAINE     SPECIMEN:  Source of Specimen:  right index finger  DISPOSITION OF SPECIMEN:  PATHOLOGY  COUNTS:  YES  TOURNIQUET:   Total Tourniquet Time Documented: Forearm (Right) - 37 minutes Total: Forearm (Right) - 37 minutes   DICTATION: .Other Dictation: Dictation Number [id number]  PLAN OF CARE: Discharge to home after PACU  PATIENT DISPOSITION:  PACU - hemodynamically stable.

## 2015-11-02 NOTE — Anesthesia Postprocedure Evaluation (Signed)
Anesthesia Post Note  Patient: Dawn Haley  Procedure(s) Performed: Procedure(s) (LRB): RIGHT INDEX EXCISION MASS  (Right)  Patient location during evaluation: PACU Anesthesia Type: MAC Level of consciousness: awake and alert Pain management: pain level controlled Vital Signs Assessment: post-procedure vital signs reviewed and stable Respiratory status: spontaneous breathing, nonlabored ventilation, respiratory function stable and patient connected to nasal cannula oxygen Cardiovascular status: stable and blood pressure returned to baseline Anesthetic complications: no    Last Vitals:  Filed Vitals:   11/02/15 1445 11/02/15 1500  BP: 111/68 121/69  Pulse: 48 59  Temp:    Resp: 12 17    Last Pain: There were no vitals filed for this visit.               Emiliana Blaize,JAMES TERRILL

## 2015-11-02 NOTE — Discharge Instructions (Addendum)

## 2015-11-05 ENCOUNTER — Encounter (HOSPITAL_BASED_OUTPATIENT_CLINIC_OR_DEPARTMENT_OTHER): Payer: Self-pay | Admitting: Orthopedic Surgery

## 2016-09-03 ENCOUNTER — Other Ambulatory Visit: Payer: Self-pay | Admitting: Family Medicine

## 2016-09-03 DIAGNOSIS — Z1231 Encounter for screening mammogram for malignant neoplasm of breast: Secondary | ICD-10-CM

## 2016-09-25 ENCOUNTER — Ambulatory Visit
Admission: RE | Admit: 2016-09-25 | Discharge: 2016-09-25 | Disposition: A | Discharge status: Home / Self Care | Payer: Managed Care, Other (non HMO) | Source: Ambulatory Visit | Attending: Family Medicine | Admitting: Family Medicine

## 2016-09-25 DIAGNOSIS — Z1231 Encounter for screening mammogram for malignant neoplasm of breast: Secondary | ICD-10-CM

## 2017-10-15 ENCOUNTER — Ambulatory Visit (INDEPENDENT_AMBULATORY_CARE_PROVIDER_SITE_OTHER): Payer: Managed Care, Other (non HMO) | Admitting: Podiatry

## 2017-10-15 ENCOUNTER — Encounter: Payer: Self-pay | Admitting: Podiatry

## 2017-10-15 VITALS — BP 121/69 | HR 69

## 2017-10-15 DIAGNOSIS — L600 Ingrowing nail: Secondary | ICD-10-CM

## 2017-10-15 NOTE — Patient Instructions (Signed)

## 2017-10-18 NOTE — Progress Notes (Signed)
Subjective:   Patient ID: Dawn Haley, female   DOB: 62 y.o.   MRN: [id number]   HPI Patient presents stating she is had trauma to the right big toenail and it is lifting up and becoming painful and she has chronic ingrown toenail of the left big toe on the medial border that make it hard to wear shoe gear comfortably.  Patient states that she tries to trim it and has tried soaks without relief of symptoms and patient does not smoke and likes to be active   Review of Systems  All other systems reviewed and are negative.       Objective:  Physical Exam  Constitutional: She appears well-developed and well-nourished.  Cardiovascular: Intact distal pulses.  Pulmonary/Chest: Effort normal.  Musculoskeletal: Normal range of motion.  Neurological: She is alert.  Skin: Skin is warm.  Nursing note and vitals reviewed.   Neurovascular status found to be intact with muscle strength adequate range of motion within normal limits with patient found to have a lifting of the right hallux nail with slight localized drainage and discomfort with an incurvated medial border left hallux chronic in nature with no redness or active drainage noted.  Patient is found to have good digital perfusion well oriented x3     Assessment:  Chronic ingrown toenail deformity left hallux with pain and on the right traumatized right hallux nail with fluid buildup of the localized nature with no indications of infective process     Plan:  H&P both conditions reviewed and recommended correction explained the procedure and risk.  Infiltrated each hallux 60 mg like Marcaine mixture and for the left hallux I remove the medial border after sterile prep with sterile instrumentation exposed matrix and applied phenol 3 applications 30 seconds followed by alcohol lavage and sterile dressing.  The right foot I went ahead and remove the nail in total and explained will allow a new nail to regrow and it is possible that will be a  problem and ultimately could require permanent type procedure

## 2018-06-12 ENCOUNTER — Ambulatory Visit (INDEPENDENT_AMBULATORY_CARE_PROVIDER_SITE_OTHER): Payer: Managed Care, Other (non HMO) | Admitting: Podiatry

## 2018-06-12 ENCOUNTER — Encounter: Payer: Self-pay | Admitting: Podiatry

## 2018-06-12 VITALS — BP 120/73 | HR 74 | Temp 97.2°F | Resp 14

## 2018-06-12 DIAGNOSIS — L03032 Cellulitis of left toe: Secondary | ICD-10-CM | POA: Diagnosis not present

## 2018-06-12 NOTE — Patient Instructions (Signed)

## 2018-06-14 ENCOUNTER — Telehealth: Payer: Self-pay | Admitting: Podiatry

## 2018-06-14 NOTE — Telephone Encounter (Signed)
Left message for pt to call for information. 

## 2018-06-14 NOTE — Telephone Encounter (Signed)
Pt was seen on Saturday for ingrown toenail and states that the area seems more red and irritated than it was before appt. Pt was told to call if area seemed to get worse. Please give pt a call.

## 2018-06-14 NOTE — Telephone Encounter (Signed)
I called pt and she states the area where Dr. Charlsie Merles removed what he called a pearl is more red than before the procedure. I told pt that this close to the procedure date, it was the area was irritated from the cutting of the procedure. Pt states there is white flesh at the site too. I told pt the white flesh is the cut edge of the procedure site becoming puffy from the soaks. Pt offered an photo and I had her send it to my email. I told pt to continue the epsom salt soaks and antibiotic ointment bandaid or could switch to betadine, but she states she is using the epsom salt. Pt asked for the anesthetic used on Saturday and I informed xylocaine and marcaine. Pt states the numbing agents did not work for her and had not worked at the dental procedure.

## 2018-06-15 NOTE — Telephone Encounter (Signed)
Left message informing pt the photos had not been received in my [facility] email, to call to discuss if continuing to have a problem.

## 2018-06-15 NOTE — Telephone Encounter (Addendum)
I evaluated my [facility] Email and pt's photos did not come in on 06/14/2018 and 06/15/2018.

## 2018-06-21 NOTE — Progress Notes (Signed)
Subjective:   Patient ID: Dawn Haley, female   DOB: 63 y.o.   MRN: [id number]   HPI Patient presents stating that the left toe has become red and irritated over the last few weeks and she does not remember injury or other pathology   ROS      Objective:  Physical Exam  Neurovascular status intact with inflamed left hallux medial border that is localized with mild erythema but no proximal edema erythema or drainage noted     Assessment:  Paronychia infection of the left hallux localized in nature     Plan:  H&P condition reviewed and recommended removal of the nail border with possibility for re-chemical lysing the area.  I went ahead today and infiltrated 60 mg like Marcaine mixture sterile prep applied to the big toe and using sterile instrumentation I remove the medial border removed a small amount of abscess localized tissue flush the wound and applied sterile dressing and gave instructions for soaks.  If any redness or symptoms persist patient to be seen back immediately and will contact us

## 2019-02-28 ENCOUNTER — Other Ambulatory Visit: Payer: Self-pay | Admitting: Family Medicine

## 2019-02-28 DIAGNOSIS — Z1231 Encounter for screening mammogram for malignant neoplasm of breast: Secondary | ICD-10-CM

## 2019-03-10 ENCOUNTER — Other Ambulatory Visit: Payer: Self-pay

## 2019-03-10 ENCOUNTER — Encounter (HOSPITAL_COMMUNITY): Payer: Self-pay | Admitting: Emergency Medicine

## 2019-03-10 ENCOUNTER — Emergency Department (HOSPITAL_COMMUNITY): Payer: Managed Care, Other (non HMO)

## 2019-03-10 ENCOUNTER — Emergency Department (HOSPITAL_COMMUNITY)
Admission: EM | Admit: 2019-03-10 | Discharge: 2019-03-10 | Disposition: A | Discharge status: Home / Self Care | Payer: Managed Care, Other (non HMO) | Attending: Emergency Medicine | Admitting: Emergency Medicine

## 2019-03-10 DIAGNOSIS — K5792 Diverticulitis of intestine, part unspecified, without perforation or abscess without bleeding: Secondary | ICD-10-CM | POA: Insufficient documentation

## 2019-03-10 DIAGNOSIS — R1032 Left lower quadrant pain: Secondary | ICD-10-CM | POA: Diagnosis present

## 2019-03-10 DIAGNOSIS — Z79899 Other long term (current) drug therapy: Secondary | ICD-10-CM | POA: Insufficient documentation

## 2019-03-10 LAB — URINALYSIS, ROUTINE W REFLEX MICROSCOPIC
Bilirubin Urine: NEGATIVE
Glucose, UA: NEGATIVE mg/dL
Hgb urine dipstick: NEGATIVE
Ketones, ur: NEGATIVE mg/dL
Leukocytes,Ua: NEGATIVE
Nitrite: NEGATIVE
Protein, ur: NEGATIVE mg/dL
Specific Gravity, Urine: 1.003 — ABNORMAL LOW (ref 1.005–1.030)
pH: 6 (ref 5.0–8.0)

## 2019-03-10 LAB — COMPREHENSIVE METABOLIC PANEL
ALT: 44 U/L (ref 0–44)
AST: 30 U/L (ref 15–41)
Albumin: 4.1 g/dL (ref 3.5–5.0)
Alkaline Phosphatase: 65 U/L (ref 38–126)
Anion gap: 8 (ref 5–15)
BUN: 11 mg/dL (ref 8–23)
CO2: 25 mmol/L (ref 22–32)
Calcium: 9.1 mg/dL (ref 8.9–10.3)
Chloride: 104 mmol/L (ref 98–111)
Creatinine, Ser: 0.78 mg/dL (ref 0.44–1.00)
GFR calc Af Amer: >60 mL/min (ref >60)
GFR calc non Af Amer: >60 mL/min (ref >60)
Glucose, Bld: 121 mg/dL — ABNORMAL HIGH (ref 70–99)
Potassium: 4.4 mmol/L (ref 3.5–5.1)
Sodium: 137 mmol/L (ref 135–145)
Total Bilirubin: 0.8 mg/dL (ref 0.3–1.2)
Total Protein: 7.3 g/dL (ref 6.5–8.1)

## 2019-03-10 LAB — CBC
HCT: 42.3 % (ref 36.0–46.0)
Hemoglobin: 13.7 g/dL (ref 12.0–15.0)
MCH: 29.8 pg (ref 26.0–34.0)
MCHC: 32.4 g/dL (ref 30.0–36.0)
MCV: 92.2 fL (ref 80.0–100.0)
Platelets: 264 10*3/uL (ref 150–400)
RBC: 4.59 MIL/uL (ref 3.87–5.11)
RDW: 12.7 % (ref 11.5–15.5)
WBC: 7.9 10*3/uL (ref 4.0–10.5)
nRBC: 0 % (ref 0.0–0.2)

## 2019-03-10 LAB — LIPASE, BLOOD: Lipase: 25 U/L (ref 11–51)

## 2019-03-10 MED ORDER — IOHEXOL 300 MG/ML  SOLN
100.0000 mL | Freq: Once | INTRAMUSCULAR | Status: AC | PRN
  Administered 2019-03-10: 20:00:00 100 mL via INTRAVENOUS

## 2019-03-10 MED ORDER — CIPROFLOXACIN HCL 500 MG PO TABS: 500.0000 mg | ORAL_TABLET | Freq: Two times a day (BID) | ORAL | 0 refills | Status: DC

## 2019-03-10 MED ORDER — CIPROFLOXACIN IN D5W 400 MG/200ML IV SOLN
400.0000 mg | Freq: Once | INTRAVENOUS | Status: AC
  Administered 2019-03-10: 21:00:00 400 mg via INTRAVENOUS
  Filled 2019-03-10: qty 200

## 2019-03-10 MED ORDER — METRONIDAZOLE 500 MG PO TABS: 500.0000 mg | ORAL_TABLET | Freq: Two times a day (BID) | ORAL | 0 refills | Status: DC

## 2019-03-10 MED ORDER — TRAMADOL HCL 50 MG PO TABS: 50.0000 mg | ORAL_TABLET | Freq: Four times a day (QID) | ORAL | 0 refills | Status: DC | PRN

## 2019-03-10 MED ORDER — SODIUM CHLORIDE 0.9% FLUSH
3.0000 mL | Freq: Once | INTRAVENOUS | Status: AC
  Administered 2019-03-10: 20:00:00 3 mL via INTRAVENOUS

## 2019-03-10 NOTE — ED Provider Notes (Signed)
[facility]-EMERGENCY DEPT Provider Note   CSN: [id number] Arrival date & time: 03/10/19  1404     History   Chief Complaint Chief Complaint  Patient presents with  . Abdominal Pain    HPI Dawn Haley is a 63 y.o. female.     HPI Patient presents to the emergency department with abdominal discomfort that started on Sunday evening.  The patient states that she has not really had any other symptoms other than this sharp abdominal pains.  Patient states it does seem to wax and wane somewhat.  Patient states she did not take any medications prior to arrival for her symptoms.  She states she did start a stool softener but she was concerned she could be constipated even though she has not had any constipation.  Patient states she is not noticed any blood in her stools.  The patient denies chest pain, shortness of breath, headache,blurred vision, neck pain, fever, cough, weakness, numbness, dizziness, anorexia, edema,  nausea, vomiting, diarrhea, rash, back pain, dysuria, hematemesis, bloody stool, near syncope, or syncope. History reviewed. No pertinent past medical history.  Patient Active Problem List   Diagnosis Date Noted  . Chest pain 06/06/2015  . GERD (gastroesophageal reflux disease) 06/06/2015  . Costochondritis 06/06/2015    Past Surgical History:  Procedure Laterality Date  . ABDOMINAL HYSTERECTOMY    . APPENDECTOMY    . CYST EXCISION Right 11/02/2015   Procedure: RIGHT INDEX EXCISION MASS ;  Surgeon: Betha Loa, MD;  Location: [facility];  Service: Orthopedics;  Laterality: Right;  . DIAGNOSTIC LAPAROSCOPY    . TONSILLECTOMY    . URETHRAL DILATION       OB History   No obstetric history on file.      Home Medications    Prior to Admission medications   Medication Sig Start Date End Date Taking? Authorizing Provider  ASHWAGANDHA PO Take 1 Dose by mouth daily.    [provider]  chlorthalidone (HYGROTON) 25 MG  tablet Take 25 mg by mouth daily.    [provider]  cholecalciferol (VITAMIN D) 1000 units tablet Take 1,000 Units by mouth daily.    [provider]  fluticasone (FLONASE) 50 MCG/ACT nasal spray Place into both nostrils daily.    [provider]  GARLIC PO Take 1 Dose by mouth daily.    [provider]  LORazepam (ATIVAN) 0.5 MG tablet Take 0.5 mg by mouth at bedtime as needed for sleep.  05/30/15   [provider]  Multiple Vitamin (MULTIVITAMIN WITH MINERALS) TABS tablet Take 1 tablet by mouth daily.    [provider]  Omega-3 Fatty Acids (FISH OIL) 1000 MG CAPS Take 1,000 mg by mouth daily.    [provider]  Ubiquinol 100 MG CAPS Take 1 mg by mouth daily.    [provider]    Family History Family History  Problem Relation Age of Onset  . Heart disease Father   . Stroke Father     Social History Social History   Tobacco Use  . Smoking status: Never Smoker  . Smokeless tobacco: Never Used  Substance Use Topics  . Alcohol use: Yes    Comment: social  . Drug use: Not on file     Allergies   Codeine and Pentothal [thiopental]   Review of Systems Review of Systems All other systems negative except as documented in the HPI. All pertinent positives and negatives as reviewed in the HPI.  Physical Exam Updated Vital Signs BP 131/71 (BP Location: Left Arm)   Pulse 70   Temp 98.3 F (36.8 C) (Oral)   Resp 18   Ht 5' 10.5" (1.791 m)   Wt 99.3 kg   SpO2 99%   BMI 30.98 kg/m   Physical Exam Vitals signs and nursing note reviewed.  Constitutional:      General: She is not in acute distress.    Appearance: She is well-developed.  HENT:     Head: Normocephalic and atraumatic.  Eyes:     Pupils: Pupils are equal, round, and reactive to light.  Neck:     Musculoskeletal: Normal range of motion and neck supple.  Cardiovascular:     Rate and Rhythm: Normal rate and regular rhythm.     Heart  sounds: Normal heart sounds. No murmur. No friction rub. No gallop.   Pulmonary:     Effort: Pulmonary effort is normal. No respiratory distress.     Breath sounds: Normal breath sounds. No wheezing.  Abdominal:     General: Bowel sounds are normal. There is no distension.     Palpations: Abdomen is soft.     Tenderness: There is abdominal tenderness in the left lower quadrant.  Skin:    General: Skin is warm and dry.     Capillary Refill: Capillary refill takes less than 2 seconds.     Findings: No erythema or rash.  Neurological:     Mental Status: She is alert and oriented to person, place, and time.     Motor: No abnormal muscle tone.     Coordination: Coordination normal.  Psychiatric:        Behavior: Behavior normal.      ED Treatments / Results  Labs (all labs ordered are listed, but only abnormal results are displayed) Labs Reviewed  COMPREHENSIVE METABOLIC PANEL - Abnormal; Notable for the following components:      Result Value   Glucose, Bld 121 (*)    All other components within normal limits  URINALYSIS, ROUTINE W REFLEX MICROSCOPIC - Abnormal; Notable for the following components:   Color, Urine STRAW (*)    Specific Gravity, Urine 1.003 (*)    All other components within normal limits  LIPASE, BLOOD  CBC    EKG None  Radiology Ct Abdomen Pelvis W Contrast  Result Date: 03/10/2019 CLINICAL DATA:  Generalized abdominal pain EXAM: CT ABDOMEN AND PELVIS WITH CONTRAST TECHNIQUE: Multidetector CT imaging of the abdomen and pelvis was performed using the standard protocol following bolus administration of intravenous contrast. CONTRAST:  100mL OMNIPAQUE IOHEXOL 300 MG/ML  SOLN COMPARISON:  None. FINDINGS: Lower chest: No acute abnormality. Hepatobiliary: Fatty infiltration of the liver is noted. Gallbladder is within normal limits. Pancreas: Unremarkable. No pancreatic ductal dilatation or surrounding inflammatory changes. Spleen: Normal in size without focal  abnormality. Adrenals/Urinary Tract: Adrenal glands are within normal limits. Kidneys are well visualized bilaterally. No renal calculi are noted. The left renal collecting system is within normal limits. Mild fullness of the right renal collecting system and ureter is seen likely related to extrinsic compression. The bladder is well distended. Stomach/Bowel: There are changes consistent with diverticulitis within the sigmoid colon. Surrounding inflammatory changes are noted which likely contribute to the fullness of the right renal collecting system. The inflammatory changes are noted along the superolateral aspect of the urinary bladder although no definitive fistulization is seen. The more proximal colon is within normal limits. The appendix is not seen due to prior  appendectomy. Vascular/Lymphatic: Aortic atherosclerosis. No enlarged abdominal or pelvic lymph nodes. Reproductive: Status post hysterectomy. No adnexal masses. Other: No abdominal wall hernia or abnormality. No abdominopelvic ascites. Musculoskeletal: Degenerative changes of lumbar spine are noted. IMPRESSION: Changes consistent sigmoid diverticulitis with surrounding inflammatory change contributing to the fullness of the right ureter. The inflammatory changes extend along the margin of the bladder superiorly although no changes to suggest fistula are noted at this time. Fatty infiltration of the liver is noted. Electronically Signed   By: Inez Catalina M.D.   On: 03/10/2019 20:07    Procedures Procedures (including critical care time)  Medications Ordered in ED Medications  ciprofloxacin (CIPRO) IVPB 400 mg (400 mg Intravenous New Bag/Given 03/10/19 2108)  sodium chloride flush (NS) 0.9 % injection 3 mL (3 mLs Intravenous Given 03/10/19 2006)  iohexol (OMNIPAQUE) 300 MG/ML solution 100 mL (100 mLs Intravenous Contrast Given 03/10/19 1953)     Initial Impression / Assessment and Plan / ED Course  I have reviewed the triage vital signs  and the nursing notes.  Pertinent labs & imaging results that were available during my care of the patient were reviewed by me and considered in my medical decision making (see chart for details).  Clinical Course as of Mar 09 2136  Thu Mar 10, 2019  1925 CT Abdomen Pelvis W Contrast [CA]    Clinical Course User Index [CA] Tracey Harries, [place]       Patient has what appears to be diverticulitis noted on CT scan.  Patient will be treated with antibiotics.  The patient is advised of the plan and all questions were answered.  I will refer her back to her primary doctor and GI.  I have advised patient this condition can worsen and she will need to return here for any worsening in her condition. Final Clinical Impressions(s) / ED Diagnoses   Final diagnoses:  None    ED Discharge Orders    None       Dalia Heading, PA-C 03/10/19 2140    Wyvonnia Dusky, MD 03/11/19 [phone]

## 2019-03-10 NOTE — ED Notes (Signed)
Pt was verbalized discharge instructions. Pt had no further questions at this time. NAD. Pt will be discharged after antibiotics finish.

## 2019-03-10 NOTE — ED Triage Notes (Signed)
Pt reports been having abd pains for several days. Will come in waves. Reports having to take stool softners everyday. Is more aware of pains when goes from sitting to standing position.

## 2019-03-10 NOTE — Discharge Instructions (Signed)
Return here as needed for any worsening in your condition.  Monitor your symptoms closely as this can worsen.  Follow-up with the doctor provided along with your primary care doctor.

## 2019-04-14 ENCOUNTER — Other Ambulatory Visit: Payer: Self-pay | Admitting: Gastroenterology

## 2019-04-14 DIAGNOSIS — K5792 Diverticulitis of intestine, part unspecified, without perforation or abscess without bleeding: Secondary | ICD-10-CM

## 2019-04-14 DIAGNOSIS — R1032 Left lower quadrant pain: Secondary | ICD-10-CM

## 2019-04-19 ENCOUNTER — Ambulatory Visit
Admission: RE | Admit: 2019-04-19 | Discharge: 2019-04-19 | Disposition: A | Discharge status: Home / Self Care | Payer: Managed Care, Other (non HMO) | Source: Ambulatory Visit | Attending: Family Medicine | Admitting: Family Medicine

## 2019-04-19 ENCOUNTER — Other Ambulatory Visit: Payer: Self-pay

## 2019-04-19 DIAGNOSIS — Z1231 Encounter for screening mammogram for malignant neoplasm of breast: Secondary | ICD-10-CM

## 2019-04-21 ENCOUNTER — Ambulatory Visit
Admission: RE | Admit: 2019-04-21 | Discharge: 2019-04-21 | Disposition: A | Discharge status: Home / Self Care | Payer: Managed Care, Other (non HMO) | Source: Ambulatory Visit | Attending: Gastroenterology | Admitting: Gastroenterology

## 2019-04-21 ENCOUNTER — Other Ambulatory Visit: Payer: Self-pay | Admitting: Family Medicine

## 2019-04-21 DIAGNOSIS — Z13820 Encounter for screening for osteoporosis: Secondary | ICD-10-CM

## 2019-04-21 DIAGNOSIS — K5792 Diverticulitis of intestine, part unspecified, without perforation or abscess without bleeding: Secondary | ICD-10-CM

## 2019-04-21 DIAGNOSIS — R1032 Left lower quadrant pain: Secondary | ICD-10-CM

## 2019-04-21 MED ORDER — IOPAMIDOL (ISOVUE-300) INJECTION 61%
100.0000 mL | Freq: Once | INTRAVENOUS | Status: AC | PRN
  Administered 2019-04-21: 16:00:00 100 mL via INTRAVENOUS

## 2019-05-12 ENCOUNTER — Other Ambulatory Visit: Payer: Self-pay

## 2019-05-12 ENCOUNTER — Ambulatory Visit
Admission: RE | Admit: 2019-05-12 | Discharge: 2019-05-12 | Disposition: A | Discharge status: Home / Self Care | Payer: Managed Care, Other (non HMO) | Source: Ambulatory Visit | Attending: Family Medicine | Admitting: Family Medicine

## 2019-05-12 DIAGNOSIS — Z13820 Encounter for screening for osteoporosis: Secondary | ICD-10-CM

## 2019-08-21 ENCOUNTER — Other Ambulatory Visit: Payer: Self-pay

## 2019-08-21 ENCOUNTER — Emergency Department (HOSPITAL_COMMUNITY)
Admission: EM | Admit: 2019-08-21 | Discharge: 2019-08-21 | Disposition: A | Discharge status: Home / Self Care | Payer: Managed Care, Other (non HMO) | Attending: Emergency Medicine | Admitting: Emergency Medicine

## 2019-08-21 ENCOUNTER — Encounter (HOSPITAL_COMMUNITY): Payer: Self-pay | Admitting: Emergency Medicine

## 2019-08-21 DIAGNOSIS — N939 Abnormal uterine and vaginal bleeding, unspecified: Secondary | ICD-10-CM

## 2019-08-21 DIAGNOSIS — Z79899 Other long term (current) drug therapy: Secondary | ICD-10-CM | POA: Diagnosis not present

## 2019-08-21 HISTORY — DX: Diverticulitis of intestine, part unspecified, without perforation or abscess without bleeding: K57.92

## 2019-08-21 LAB — COMPREHENSIVE METABOLIC PANEL
ALT: 26 U/L (ref 0–44)
AST: 22 U/L (ref 15–41)
Albumin: 3.8 g/dL (ref 3.5–5.0)
Alkaline Phosphatase: 75 U/L (ref 38–126)
Anion gap: 12 (ref 5–15)
BUN: 11 mg/dL (ref 8–23)
CO2: 23 mmol/L (ref 22–32)
Calcium: 8.9 mg/dL (ref 8.9–10.3)
Chloride: 102 mmol/L (ref 98–111)
Creatinine, Ser: 0.85 mg/dL (ref 0.44–1.00)
GFR calc Af Amer: >60 mL/min (ref >60)
GFR calc non Af Amer: >60 mL/min (ref >60)
Glucose, Bld: 132 mg/dL — ABNORMAL HIGH (ref 70–99)
Potassium: 3.7 mmol/L (ref 3.5–5.1)
Sodium: 137 mmol/L (ref 135–145)
Total Bilirubin: 0.5 mg/dL (ref 0.3–1.2)
Total Protein: 6.8 g/dL (ref 6.5–8.1)

## 2019-08-21 LAB — CBC
HCT: 42.5 % (ref 36.0–46.0)
Hemoglobin: 14.1 g/dL (ref 12.0–15.0)
MCH: 30.1 pg (ref 26.0–34.0)
MCHC: 33.2 g/dL (ref 30.0–36.0)
MCV: 90.6 fL (ref 80.0–100.0)
Platelets: 287 10*3/uL (ref 150–400)
RBC: 4.69 MIL/uL (ref 3.87–5.11)
RDW: 12.5 % (ref 11.5–15.5)
WBC: 8.1 10*3/uL (ref 4.0–10.5)
nRBC: 0 % (ref 0.0–0.2)

## 2019-08-21 LAB — URINALYSIS, ROUTINE W REFLEX MICROSCOPIC
Bilirubin Urine: NEGATIVE
Glucose, UA: NEGATIVE mg/dL
Ketones, ur: NEGATIVE mg/dL
Nitrite: NEGATIVE
Protein, ur: NEGATIVE mg/dL
Specific Gravity, Urine: 1.003 — ABNORMAL LOW (ref 1.005–1.030)
pH: 6 (ref 5.0–8.0)

## 2019-08-21 LAB — LIPASE, BLOOD: Lipase: 28 U/L (ref 11–51)

## 2019-08-21 MED ORDER — SODIUM CHLORIDE 0.9% FLUSH: 3.0000 mL | Freq: Once | INTRAVENOUS | Status: DC

## 2019-08-21 NOTE — ED Triage Notes (Signed)
Reports bright red vaginal bleeding with clots x 1 hour.  Hx of hysterectomy.  Very mild lower abd pain that she states may be related to diverticulitis.   Scheduled for colonoscopy in 1 week.

## 2019-08-22 ENCOUNTER — Ambulatory Visit (INDEPENDENT_AMBULATORY_CARE_PROVIDER_SITE_OTHER): Payer: Managed Care, Other (non HMO) | Admitting: Obstetrics and Gynecology

## 2019-08-22 ENCOUNTER — Other Ambulatory Visit: Payer: Self-pay

## 2019-08-22 ENCOUNTER — Encounter: Payer: Self-pay | Admitting: Obstetrics and Gynecology

## 2019-08-22 VITALS — BP 130/80 | Ht 70.0 in | Wt 222.0 lb

## 2019-08-22 DIAGNOSIS — Z90710 Acquired absence of both cervix and uterus: Secondary | ICD-10-CM | POA: Diagnosis not present

## 2019-08-22 DIAGNOSIS — N950 Postmenopausal bleeding: Secondary | ICD-10-CM | POA: Diagnosis not present

## 2019-08-22 LAB — GC/CHLAMYDIA PROBE AMP (~~LOC~~) NOT AT ARMC
Chlamydia: NEGATIVE
Comment: NEGATIVE
Comment: NORMAL
Neisseria Gonorrhea: NEGATIVE

## 2019-08-22 NOTE — ED Provider Notes (Signed)
[facility] EMERGENCY DEPARTMENT Provider Note   CSN: [id number] Arrival date & time: 08/21/19  1721     History Chief Complaint  Patient presents with  . Vaginal Bleeding    Dawn Haley is a 64 y.o. female.  64yo F w/ PMH including diverticulitis and hysterectomy who p/w vaginal bleeding.  Earlier today, the patient went to the bathroom and noted some vaginal bleeding when she wiped.  She is continued to have mild vaginal bleeding since it began, no heavy bleeding and no suprapubic pain.  No urinary symptoms or diarrhea.  She notes that ever since she has had diverticulitis in the past she occasionally has abdominal pains but she denies any current diverticulitis symptoms.  She has never had problems like this before.  She has not been sexually active recently. No hx trauma.  The history is provided by the patient.       Past Medical History:  Diagnosis Date  . Diverticulitis     Patient Active Problem List   Diagnosis Date Noted  . Chest pain 06/06/2015  . GERD (gastroesophageal reflux disease) 06/06/2015  . Costochondritis 06/06/2015    Past Surgical History:  Procedure Laterality Date  . ABDOMINAL HYSTERECTOMY    . APPENDECTOMY    . CYST EXCISION Right 11/02/2015   Procedure: RIGHT INDEX EXCISION MASS ;  Surgeon: Betha Loa, MD;  Location: [facility];  Service: Orthopedics;  Laterality: Right;  . DIAGNOSTIC LAPAROSCOPY    . TONSILLECTOMY    . URETHRAL DILATION       OB History   No obstetric history on file.     Family History  Problem Relation Age of Onset  . Heart disease Father   . Stroke Father     Social History   Tobacco Use  . Smoking status: Never Smoker  . Smokeless tobacco: Never Used  Substance Use Topics  . Alcohol use: Yes    Comment: social  . Drug use: Never    Home Medications Prior to Admission medications   Medication Sig Start Date End Date Taking? Authorizing Provider  ASHWAGANDHA PO  Take 1 Dose by mouth daily.    [provider]  chlorthalidone (HYGROTON) 25 MG tablet Take 25 mg by mouth daily.    [provider]  cholecalciferol (VITAMIN D) 1000 units tablet Take 1,000 Units by mouth daily.    [provider]  ciprofloxacin (CIPRO) 500 MG tablet Take 1 tablet (500 mg total) by mouth 2 (two) times daily. 03/10/19   Lawyer, Cristal Deer, PA-C  fluticasone (FLONASE) 50 MCG/ACT nasal spray Place into both nostrils daily.    [provider]  GARLIC PO Take 1 Dose by mouth daily.    [provider]  LORazepam (ATIVAN) 0.5 MG tablet Take 0.5 mg by mouth at bedtime as needed for sleep.  05/30/15   [provider]  metroNIDAZOLE (FLAGYL) 500 MG tablet Take 1 tablet (500 mg total) by mouth 2 (two) times daily. 03/10/19   Lawyer, Cristal Deer, PA-C  Multiple Vitamin (MULTIVITAMIN WITH MINERALS) TABS tablet Take 1 tablet by mouth daily.    [provider]  Omega-3 Fatty Acids (FISH OIL) 1000 MG CAPS Take 1,000 mg by mouth daily.    [provider]  traMADol (ULTRAM) 50 MG tablet Take 1 tablet (50 mg total) by mouth every 6 (six) hours as needed for severe pain. 03/10/19   Lawyer, Cristal Deer, PA-C  Ubiquinol 100 MG CAPS Take 1 mg by mouth  daily.    [provider]    Allergies    Codeine and Pentothal [thiopental]  Review of Systems   Review of Systems All other systems reviewed and are negative except that which was mentioned in HPI  Physical Exam Updated Vital Signs BP (!) 143/82   Pulse 79   Temp 98.7 F (37.1 C) (Oral)   Resp 18   Ht 5' 10.5" (1.791 m)   Wt 98.9 kg   SpO2 98%   BMI 30.84 kg/m   Physical Exam Vitals and nursing note reviewed. Exam conducted with a chaperone present.  Constitutional:      General: She is not in acute distress.    Appearance: She is well-developed.  HENT:     Head: Normocephalic and atraumatic.  Eyes:     Conjunctiva/sclera: Conjunctivae normal.    Abdominal:     General: Abdomen is flat. There is no distension.     Palpations: Abdomen is soft.     Tenderness: There is no abdominal tenderness.  Genitourinary:    Comments: Small amount of bleeding in vaginal vault, no masses or mucosal lesions, no masses on bimanual exam Musculoskeletal:     Cervical back: Neck supple.  Skin:    General: Skin is warm and dry.  Neurological:     Mental Status: She is alert and oriented to person, place, and time.  Psychiatric:        Judgment: Judgment normal.     ED Results / Procedures / Treatments   Labs (all labs ordered are listed, but only abnormal results are displayed) Labs Reviewed  COMPREHENSIVE METABOLIC PANEL - Abnormal; Notable for the following components:      Result Value   Glucose, Bld 132 (*)    All other components within normal limits  URINALYSIS, ROUTINE W REFLEX MICROSCOPIC - Abnormal; Notable for the following components:   Color, Urine STRAW (*)    Specific Gravity, Urine 1.003 (*)    Hgb urine dipstick MODERATE (*)    Leukocytes,Ua MODERATE (*)    Bacteria, UA RARE (*)    All other components within normal limits  URINE CULTURE  LIPASE, BLOOD  CBC  GC/CHLAMYDIA PROBE AMP ([place]) NOT AT [facility]    EKG None  Radiology No results found.  Procedures Procedures (including critical care time)  Medications Ordered in ED Medications  sodium chloride flush (NS) 0.9 % injection 3 mL (has no administration in time range)    ED Course  I have reviewed the triage vital signs and the nursing notes.  Pertinent labs that were available during my care of the patient were reviewed by me and considered in my medical decision making (see chart for details).    MDM Rules/Calculators/A&P                      No heavy bleeding on exam, no trauma. Very low suspicion for infectious process. Basic labs reassuring. Recommended close OBGYN clinic f/u. Reviewed return precautions. Final Clinical Impression(s) / ED  Diagnoses Final diagnoses:  Abnormal vaginal bleeding    Rx / DC Orders ED Discharge Orders    None       Makinlee Awwad, Wenda Overland, MD 08/22/19 0004

## 2019-08-22 NOTE — Progress Notes (Signed)
Dawn Haley 06/11/1955 [id number]  SUBJECTIVE:  64 y.o. [id number] with a history of hysterectomy and reportedly with BSO about 25 years ago who presents for follow-up from the emergency room where she had presented yesterday for evaluation of vaginal bleeding.  Hemoglobin 14.1 at that time.  She had noticed quite a bit of bright red blood that resolved after wiping several sheets of toilet paper.  She has not had any further bleeding.  She did not have a bowel movement preceding the bleeding onset.  She says her hysterectomy was for heavy menstrual bleeding and she has not had any bleeding since.  She says that the tissue was benign and not cancerous.  She had a remote history of an abnormal Pap smear in her college years and said she was going to have cryotherapy done but then the abnormality had resolved on its own.  She has not had any abnormalities since then.  She is married.  Not sexually active due to history of delicate tissue in the vaginal area that has had bleeding.  She has not been on any hormones for several years now.  She has not placed anything vaginally that would have caused any bleeding.  She had a colonoscopy this year.   Current Outpatient Medications  Medication Sig Dispense Refill  . ASHWAGANDHA PO Take 1 Dose by mouth daily.    . cholecalciferol (VITAMIN D) 1000 units tablet Take 5,000 Units by mouth daily.     . fluticasone (FLONASE) 50 MCG/ACT nasal spray Place into both nostrils daily.    Marland Kitchen GARLIC PO Take 1 Dose by mouth daily.    Marland Kitchen ibuprofen (ADVIL) 600 MG tablet Take 600 mg by mouth every 6 (six) hours as needed.    . loratadine (CLARITIN) 10 MG tablet Take 10 mg by mouth daily.    Marland Kitchen LORazepam (ATIVAN) 0.5 MG tablet Take 0.5 mg by mouth at bedtime as needed for sleep.   0  . Multiple Vitamin (MULTIVITAMIN WITH MINERALS) TABS tablet Take 1 tablet by mouth daily.    . Omega-3 Fatty Acids (FISH OIL) 1000 MG CAPS Take 1,000 mg by mouth daily.    . Probiotic Product  (PROBIOTIC PO) Take by mouth.    . Ubiquinol 100 MG CAPS Take 1 mg by mouth daily.     No current facility-administered medications for this visit.   Allergies: Codeine and Pentothal [thiopental]  No LMP recorded. Patient has had a hysterectomy.  Past medical history,surgical history, problem list, medications, allergies, family history and social history were all reviewed and documented as reviewed in the EPIC chart.  ROS:  Feeling well. No dyspnea or chest pain on exertion.  No abdominal pain, change in bowel habits, black or bloody stools.  No urinary tract symptoms. GYN ROS: +vaginal bleeding episode   OBJECTIVE:  BP 130/80   Ht 5\' 10"  (1.778 m)   Wt 222 lb (100.7 kg)   BMI 31.85 kg/m  The patient appears well, alert, oriented x 3, in no distress. PELVIC EXAM: VULVA: normal appearing vulva with no masses, tenderness or lesions, atrophic changes noted, VAGINA: normal appearing vagina with normal color and no obvious discharge, no lesions, gentle probing of the vaginal cuff shows a scant amount of pink-tinged coloration on the cotton swab, no active bleeding, no masses, no epithelium, CERVIX: surgically absent, UTERUS: surgically absent, vaginal cuff normal, ADNEXA: no masses  Chaperone: Caryn Bee present during the examination  ASSESSMENT:  64 y.o. [id number] with a remote history  of hysterectomy who presents with transient vaginal bleeding episode, now resolved  PLAN:  No obvious source of bleeding on the examination today.  Vaginal atrophy could explain the bleeding perhaps this was spurred on by physical nonsexual activity.  I encouraged the patient to monitor for any recurrence of vaginal bleeding and to let us know right away.  Vaginal dysplasia unlikely given the long duration of time since she had cervical dysplasia and no problems in the interim.  Patient is reassured there are no abnormalities on the examination today.  Vaginal cuff Pap smear is offered to be performed but  after discussion we decided to defer this for now, we should certainly collect a Pap smear if there is any repeat occurrence of the bleeding.    Theresia Majors MD, FACOG  08/22/19

## 2019-08-23 LAB — URINE CULTURE

## 2019-09-02 ENCOUNTER — Telehealth: Payer: Self-pay

## 2019-09-02 NOTE — Telephone Encounter (Addendum)
Patient called stating she was seen by Dr. Penni Bombard on 08/22/19 for abnormal vaginal bleeding and was reassured.  She said since then she has had vaginal discharge that is brown with a "granular looking material in the discharge".  She said it "is not a great amount but seems to be increasing".  She asked if she needed to come back and be seen for this?  She said she is out of town from Saturday to Thursday.  I called her back and told her she should schedule visit with Dr. Penni Bombard for when she returns next week. She said the thing that is "freaking me out" is that the brown looks more like stool. She said she recently had a difficult colonoscopy.  I reassure her and advised if any severe pain/high fever she can always go to ER and be assessed wherever she is. ( It is 4:00pm on Friday and office is closing early and no MD currently in our office.)   She scheduled an appointment for Friday when she returns.

## 2019-09-09 ENCOUNTER — Ambulatory Visit: Payer: Managed Care, Other (non HMO) | Admitting: Obstetrics and Gynecology

## 2019-09-09 ENCOUNTER — Other Ambulatory Visit: Payer: Self-pay

## 2019-09-12 ENCOUNTER — Encounter: Payer: Self-pay | Admitting: Obstetrics and Gynecology

## 2019-09-12 ENCOUNTER — Other Ambulatory Visit: Payer: Self-pay

## 2019-09-12 ENCOUNTER — Ambulatory Visit (INDEPENDENT_AMBULATORY_CARE_PROVIDER_SITE_OTHER): Payer: Managed Care, Other (non HMO) | Admitting: Obstetrics and Gynecology

## 2019-09-12 VITALS — BP 118/76

## 2019-09-12 DIAGNOSIS — Z1272 Encounter for screening for malignant neoplasm of vagina: Secondary | ICD-10-CM

## 2019-09-12 DIAGNOSIS — N823 Fistula of vagina to large intestine: Secondary | ICD-10-CM | POA: Diagnosis not present

## 2019-09-12 NOTE — Addendum Note (Signed)
Addended by: Dayna Barker on: 09/12/2019 09:07 AM   Modules accepted: Orders

## 2019-09-12 NOTE — Progress Notes (Signed)
Dawn Haley 12/09/1955 [id number]  SUBJECTIVE:  64 y.o. [id number] female with prior hysterectomy and BSO 25 years ago presented for evaluation of vaginal bleeding 3 weeks ago on 08/22/2019.  She was evaluated in the clinic and there were no abnormalities noted.  See the previous note for details.  She then had a colonoscopy 1 week after her visit here on 08/29/2019 to follow-up diverticulitis.  She did call back to our office noting concerns of small amount of brownish vaginal discharge with a "granular appearance".  Not currently experiencing any vaginal bleeding.  She says that she had a "difficult" colonoscopy due to scarring in the rectum so they had to use a pediatric colonoscope to get past the scarring and constriction (we do not have the colonoscopy records available to review today).  However, since that colonoscopy exam, she has noticed this granular brownish vaginal discharge and passage of vaginal gas.  She had a CT of the abdomen pelvis 04/2019 for left lower quadrant pain, and for follow up of sigmoid diverticulitis, and her adnexal areas were noted to be unremarkable in appearance at that time.   Current Outpatient Medications  Medication Sig Dispense Refill  . ASHWAGANDHA PO Take 1 Dose by mouth daily.    . cholecalciferol (VITAMIN D) 1000 units tablet Take 5,000 Units by mouth daily.     . fluticasone (FLONASE) 50 MCG/ACT nasal spray Place into both nostrils daily.    Marland Kitchen GARLIC PO Take 1 Dose by mouth daily.    Marland Kitchen ibuprofen (ADVIL) 600 MG tablet Take 600 mg by mouth every 6 (six) hours as needed.    . loratadine (CLARITIN) 10 MG tablet Take 10 mg by mouth daily.    Marland Kitchen LORazepam (ATIVAN) 0.5 MG tablet Take 0.5 mg by mouth at bedtime as needed for sleep.   0  . Multiple Vitamin (MULTIVITAMIN WITH MINERALS) TABS tablet Take 1 tablet by mouth daily.    . Omega-3 Fatty Acids (FISH OIL) 1000 MG CAPS Take 1,000 mg by mouth daily.    . Probiotic Product (PROBIOTIC PO) Take by mouth.    .  Ubiquinol 100 MG CAPS Take 1 mg by mouth daily.     No current facility-administered medications for this visit.   Allergies: Codeine and Pentothal [thiopental]  No LMP recorded. Patient has had a hysterectomy.  Past medical history,surgical history, problem list, medications, allergies, family history and social history were all reviewed and documented as reviewed in the EPIC chart.  ROS:  Feeling well. No dyspnea or chest pain on exertion.  No abdominal pain, change in bowel habits, black or bloody stools.  No urinary tract symptoms. GYN ROS: see HPI.  No neurological complaints.    OBJECTIVE:  BP 118/76  The patient appears well, alert, oriented x 3, in no distress. PELVIC EXAM: VULVA: normal appearing vulva with no masses, tenderness or lesions, VAGINA: normal appearing vagina with normal color and discharge, no lesions, vaginal cuff exhibits a medium brownish feculent matter present at the left adrenal apex.  The area is dabbed with a large cotton swab and the appearance of the discharge appears feculent.  Mild tenderness in the area with palpation.  No other abnormal vaginal discharge or bleeding is present.  CERVIX: surgically absent, UTERUS: surgically absent, ADNEXA: no masses, RECTAL: Normal rectal tone.  Massage of the rectovaginal septum just distal to the left vaginal apex results in a small amount of the feculent matter being extruded into the vagina, highly suggestive of a  rectovaginal fistula. PAP: Pap smear done today, thin-prep method  Chaperone: Caryn Bee present during the examination  ASSESSMENT:  64 y.o. 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  I showed her a pelvic anatomy model to help illustrate the findings today.  We discussed that trauma and/or inflammation at the rectovaginal septum can lead to  development of a fistulous tract, and in this case, the precipitating factor was probably related to inflammation from sigmoid diverticulitis.  Her recent colonoscopy and the inherent mechanical manipulation and pressure probably helped to uncover the fistula tract formation that had been in progress.  I told her that it would be warranted to get further evaluation from a colorectal surgery specialist and in planning for further management of this finding.  I did also obtain a vaginal cuff Pap smear given her recent episode of vaginal bleeding and remote history of abnormal Pap smear, although the bleeding may very well have been related to the fistula tract formation.  I will have office staff contact her to assist her with the surgical referral.  All questions were answered by the end of the visit.   Joseph Pierini MD 09/12/19

## 2019-09-13 ENCOUNTER — Telehealth: Payer: Self-pay | Admitting: *Deleted

## 2019-09-13 NOTE — Telephone Encounter (Signed)
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  Can we refer her to Dr. Maisie Fus in the [facility] surgery group?

## 2019-09-13 NOTE — Telephone Encounter (Signed)
Referral placed in [facility] at [facility] surgery they will call patient to schedule.

## 2019-09-14 LAB — PAP IG W/ RFLX HPV ASCU

## 2019-09-14 NOTE — Telephone Encounter (Addendum)
Patient schedule with Dr. Maisie Fus on 10/11/19.

## 2019-09-22 ENCOUNTER — Telehealth: Payer: Self-pay | Admitting: *Deleted

## 2019-09-22 NOTE — Telephone Encounter (Signed)
Patient informed with below note. 

## 2019-09-22 NOTE — Telephone Encounter (Signed)
Patient called to follow up from OV on 09/12/19, appointment with general surgeon has been moved up to 09/26/19 with Dr.Thomas. Patient said this am she noticed vaginal bleeding when wiping and more fecal matter coming for vagina, the bleeding does not appear to be a flow. Patient said the discomfort she has been having on the left lower abdomen now appears to moved slightly higher up. Using  Advil for this which helps, but discomfort does come back. She called Dr.Thomas office and was told since they have not seen her to call you for recommendations, rates pain 4-10 on pain scale. Patient reports she is unsure what to do. Please advise

## 2019-09-22 NOTE — Telephone Encounter (Signed)
I unfortunately do not have much to add for management of a fistula since that is not my area of expertise, but I suppose making sure her stools are soft but not loose and maintaining regularity may help.  She could also check with her GI doctor regarding dietary and other recommendations. The bleeding is due to the fistula tract and is not a vaginal issue.  If any heavy bleeding and saturation of pads with blood she would need to be seen in the ER.

## 2019-09-26 ENCOUNTER — Ambulatory Visit: Payer: Self-pay | Admitting: General Surgery

## 2019-09-26 NOTE — H&P (Signed)
The patient is a 64 year old female who presents with diverticulitis. 64 year old female who presents to the office after undergoing an episode of diverticulitis last fall. She underwent a follow-up CT scan which showed resolution in December. She then underwent a colonoscopy in April 2021. Colonoscopy report was reviewed. This shows that ultrasound scope was used to navigate a sigmoid stricture. Multiple diverticula were found in the sigmoid colon. There was no sign of malignancy. Since that time she has had trouble with vaginal bleeding and drainage. There is concern for a colovaginal fistula. Gynecologic exam was negative for any vaginal or cervical malignancies. Patient is status post open hysterectomy and oophorectomy as well as appendectomy. She has no major past medical history. Her vaginal drainage is ongoing.   Past Surgical History (Tanisha A. Manson Passey, RMA; 09/26/2019 2:57 PM) Appendectomy Hysterectomy (not due to cancer) - Complete Oral Surgery Tonsillectomy  Diagnostic Studies History (Tanisha A. Manson Passey, RMA; 09/26/2019 2:57 PM) Colonoscopy within last year Mammogram within last year Pap Smear 1-5 years ago  Allergies (Tanisha A. Manson Passey, RMA; 09/26/2019 3:00 PM) CODEINE Pentothal *GENERAL ANESTHETICS* Allergies Reconciled  Medication History (Tanisha A. Manson Passey, RMA; 09/26/2019 3:02 PM) Multi-Vitamin (Oral) Active. Fish Oil (1200MG  Capsule, Oral) Active. Garlic (600MG  Capsule, Oral) Active. Vitamin D3 (125 MCG(5000 UT) Tablet, Oral) Active. Ashwagandha (500MG  Capsule, Oral) Active. Chelated Calcium (200MG  Tablet, Oral) Active. Ubiquinol (100MG  Capsule, Oral) Active. Loratadine (5MG  Tablet Chewable, Oral) Active. Claritin (10MG  Tablet, Oral) Active. Flonase (50MCG/DOSE Inhaler, Nasal) Active. Probiotic (Oral) Specific strength unknown - Active. Medications Reconciled  Social History (Tanisha A. , RMA; 09/26/2019 2:57 PM) Alcohol use Moderate  alcohol use. Caffeine use Coffee. No drug use Tobacco use Never smoker.  Family History (Tanisha A. , RMA; 09/26/2019 2:57 PM) Alcohol Abuse Sister. Arthritis Mother. Cerebrovascular Accident Father. Depression Daughter. Diabetes Mellitus Sister. Heart disease in female family member before age 61 Ischemic Bowel Disease Sister. Migraine Headache Father.  Pregnancy / Birth History (Tanisha A. , RMA; 09/26/2019 2:57 PM) Age at menarche 13 years. Age of menopause <44 Contraceptive History Oral contraceptives. Gravida 5 Length (months) of breastfeeding 12-24 Maternal age 73-25 Para 2  Other Problems (Tanisha A. Manson Passey, RMA; 09/26/2019 2:57 PM) Bladder Problems Diverticulosis Hemorrhoids Hypercholesterolemia Oophorectomy Bilateral. Other disease, cancer, significant illness     Review of Systems (Tanisha A. Brown RMA; 09/26/2019 2:57 PM) General Present- Fatigue and Night Sweats. Not Present- Appetite Loss, Chills, Fever, Weight Gain and Weight Loss. HEENT Present- Seasonal Allergies and Wears glasses/contact lenses. Not Present- Earache, Hearing Loss, Hoarseness, Nose Bleed, Oral Ulcers, Ringing in the Ears, Sinus Pain, Sore Throat, Visual Disturbances and Yellow Eyes. Breast Not Present- Breast Mass, Breast Pain, Nipple Discharge and Skin Changes. Cardiovascular Not Present- Chest Pain, Difficulty Breathing Lying Down, Leg Cramps, Palpitations, Rapid Heart Rate, Shortness of Breath and Swelling of Extremities. Gastrointestinal Present- Abdominal Pain, Bloating, Change in Bowel Habits and Hemorrhoids. Not Present- Bloody Stool, Chronic diarrhea, Constipation, Difficulty Swallowing, Excessive gas, Gets full quickly at meals, Indigestion, Nausea, Rectal Pain and Vomiting. Female Genitourinary Present- Frequency, Pelvic Pain and Urgency. Not Present- Nocturia and Painful Urination. Musculoskeletal Not Present- Back Pain, Joint Pain, Joint Stiffness,  Muscle Pain, Muscle Weakness and Swelling of Extremities. Neurological Not Present- Decreased Memory, Fainting, Headaches, Numbness, Seizures, Tingling, Tremor, Trouble walking and Weakness. Psychiatric Not Present- Anxiety, Bipolar, Change in Sleep Pattern, Depression, Fearful and Frequent crying. Endocrine Not Present- Cold Intolerance, Excessive Hunger, Hair Changes, Heat Intolerance, Hot flashes and New Diabetes.  Vitals (Tanisha A. Manson Passey RMA;  09/26/2019 3:03 PM) 09/26/2019 3:02 PM Weight: 222.6 lb Height: 70.5in Body Surface Area: 2.2 m Body Mass Index: 31.49 kg/m  Temp.: 98.28F  Pulse: 89 (Regular)  BP: 116/72(Sitting, Left Arm, Standard)        Physical Exam Leighton Ruff MD; 8/88/9169 3:53 PM)  General Mental Status-Alert. General Appearance-Cooperative.  Abdomen Palpation/Percussion Palpation and Percussion of the abdomen reveal - Soft and Non Tender.    Assessment & Plan Leighton Ruff MD; 4/50/3888 3:30 PM)  DIVERTICULAR STRICTURE [phone])  Current Plans Started Flagyl 500 MG Oral Tablet, 2 (two) Tablet SEE NOTE, #6, 09/26/2019, No Refill. 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 We will get a CT scan of the pelvis with rectal contrast to evaluate its location. I suspect we will need far flange injection by urology in order to identify the ureter on the left side. We have discussed a robotic approach to this. All questions were answered. The surgery and anatomy were described to the patient as well as the risks of surgery and the possible complications. These include: Bleeding, deep abdominal infections and possible wound complications such as hernia and infection, damage to adjacent structures, leak of surgical connections, which can lead to other surgeries and possibly an ostomy, possible need for other  procedures, such as abscess drains in radiology, possible prolonged hospital stay, possible diarrhea from removal of part of the colon, possible constipation from narcotics, possible bowel, bladder or sexual dysfunction if having rectal surgery, prolonged fatigue/weakness or appetite loss, possible early recurrence of of disease, possible complications of their medical problems such as heart disease or arrhythmias or lung problems, death (less than 1%). I believe the patient

## 2019-10-03 ENCOUNTER — Other Ambulatory Visit: Payer: Self-pay | Admitting: Urology

## 2019-10-03 ENCOUNTER — Other Ambulatory Visit: Payer: Self-pay | Admitting: General Surgery

## 2019-10-03 DIAGNOSIS — K56699 Other intestinal obstruction unspecified as to partial versus complete obstruction: Secondary | ICD-10-CM

## 2019-10-03 DIAGNOSIS — N824 Other female intestinal-genital tract fistulae: Secondary | ICD-10-CM

## 2019-10-20 ENCOUNTER — Ambulatory Visit: Payer: Self-pay | Admitting: General Surgery

## 2019-10-20 ENCOUNTER — Other Ambulatory Visit: Payer: Self-pay

## 2019-10-20 ENCOUNTER — Ambulatory Visit
Admission: RE | Admit: 2019-10-20 | Discharge: 2019-10-20 | Disposition: A | Discharge status: Home / Self Care | Payer: Managed Care, Other (non HMO) | Source: Ambulatory Visit | Attending: General Surgery | Admitting: General Surgery

## 2019-10-20 DIAGNOSIS — N824 Other female intestinal-genital tract fistulae: Secondary | ICD-10-CM

## 2019-10-20 DIAGNOSIS — K56699 Other intestinal obstruction unspecified as to partial versus complete obstruction: Secondary | ICD-10-CM

## 2019-10-26 ENCOUNTER — Emergency Department (HOSPITAL_BASED_OUTPATIENT_CLINIC_OR_DEPARTMENT_OTHER): Payer: Managed Care, Other (non HMO)

## 2019-10-26 ENCOUNTER — Other Ambulatory Visit: Payer: Self-pay

## 2019-10-26 ENCOUNTER — Inpatient Hospital Stay (HOSPITAL_BASED_OUTPATIENT_CLINIC_OR_DEPARTMENT_OTHER)
Admission: EM | Admit: 2019-10-26 | Discharge: 2019-10-29 | DRG: 392 | Disposition: A | Discharge status: Home / Self Care | Payer: Managed Care, Other (non HMO) | Attending: Student | Admitting: Student

## 2019-10-26 ENCOUNTER — Encounter (HOSPITAL_BASED_OUTPATIENT_CLINIC_OR_DEPARTMENT_OTHER): Payer: Self-pay | Admitting: Emergency Medicine

## 2019-10-26 DIAGNOSIS — Z888 Allergy status to other drugs, medicaments and biological substances status: Secondary | ICD-10-CM

## 2019-10-26 DIAGNOSIS — K5792 Diverticulitis of intestine, part unspecified, without perforation or abscess without bleeding: Secondary | ICD-10-CM | POA: Diagnosis not present

## 2019-10-26 DIAGNOSIS — K5732 Diverticulitis of large intestine without perforation or abscess without bleeding: Secondary | ICD-10-CM | POA: Diagnosis not present

## 2019-10-26 DIAGNOSIS — Z833 Family history of diabetes mellitus: Secondary | ICD-10-CM

## 2019-10-26 DIAGNOSIS — Z20822 Contact with and (suspected) exposure to covid-19: Secondary | ICD-10-CM | POA: Diagnosis present

## 2019-10-26 DIAGNOSIS — Z79899 Other long term (current) drug therapy: Secondary | ICD-10-CM

## 2019-10-26 DIAGNOSIS — Z820 Family history of epilepsy and other diseases of the nervous system: Secondary | ICD-10-CM

## 2019-10-26 DIAGNOSIS — N824 Other female intestinal-genital tract fistulae: Secondary | ICD-10-CM | POA: Diagnosis present

## 2019-10-26 DIAGNOSIS — Z823 Family history of stroke: Secondary | ICD-10-CM

## 2019-10-26 DIAGNOSIS — Z8249 Family history of ischemic heart disease and other diseases of the circulatory system: Secondary | ICD-10-CM

## 2019-10-26 DIAGNOSIS — K219 Gastro-esophageal reflux disease without esophagitis: Secondary | ICD-10-CM | POA: Diagnosis present

## 2019-10-26 DIAGNOSIS — Z885 Allergy status to narcotic agent status: Secondary | ICD-10-CM

## 2019-10-26 DIAGNOSIS — R1032 Left lower quadrant pain: Secondary | ICD-10-CM

## 2019-10-26 DIAGNOSIS — N289 Disorder of kidney and ureter, unspecified: Secondary | ICD-10-CM | POA: Diagnosis present

## 2019-10-26 DIAGNOSIS — G4700 Insomnia, unspecified: Secondary | ICD-10-CM | POA: Diagnosis present

## 2019-10-26 LAB — COMPREHENSIVE METABOLIC PANEL
ALT: 30 U/L (ref 0–44)
AST: 23 U/L (ref 15–41)
Albumin: 4.2 g/dL (ref 3.5–5.0)
Alkaline Phosphatase: 66 U/L (ref 38–126)
Anion gap: 9 (ref 5–15)
BUN: 13 mg/dL (ref 8–23)
CO2: 25 mmol/L (ref 22–32)
Calcium: 8.5 mg/dL — ABNORMAL LOW (ref 8.9–10.3)
Chloride: 102 mmol/L (ref 98–111)
Creatinine, Ser: 1.06 mg/dL — ABNORMAL HIGH (ref 0.44–1.00)
GFR calc Af Amer: >60 mL/min (ref >60)
GFR calc non Af Amer: 55 mL/min — ABNORMAL LOW (ref >60)
Glucose, Bld: 94 mg/dL (ref 70–99)
Potassium: 3.6 mmol/L (ref 3.5–5.1)
Sodium: 136 mmol/L (ref 135–145)
Total Bilirubin: 0.4 mg/dL (ref 0.3–1.2)
Total Protein: 7.3 g/dL (ref 6.5–8.1)

## 2019-10-26 LAB — URINALYSIS, ROUTINE W REFLEX MICROSCOPIC
Bilirubin Urine: NEGATIVE
Glucose, UA: NEGATIVE mg/dL
Ketones, ur: NEGATIVE mg/dL
Nitrite: NEGATIVE
Protein, ur: NEGATIVE mg/dL
Specific Gravity, Urine: 1.01 (ref 1.005–1.030)
pH: 6 (ref 5.0–8.0)

## 2019-10-26 LAB — CBC
HCT: 42 % (ref 36.0–46.0)
Hemoglobin: 13.8 g/dL (ref 12.0–15.0)
MCH: 29.7 pg (ref 26.0–34.0)
MCHC: 32.9 g/dL (ref 30.0–36.0)
MCV: 90.5 fL (ref 80.0–100.0)
Platelets: 254 10*3/uL (ref 150–400)
RBC: 4.64 MIL/uL (ref 3.87–5.11)
RDW: 13.7 % (ref 11.5–15.5)
WBC: 6.6 10*3/uL (ref 4.0–10.5)
nRBC: 0 % (ref 0.0–0.2)

## 2019-10-26 LAB — URINALYSIS, MICROSCOPIC (REFLEX): WBC, UA: >50 WBC/hpf (ref 0–5)

## 2019-10-26 LAB — SARS CORONAVIRUS 2 BY RT PCR (HOSPITAL ORDER, PERFORMED IN ~~LOC~~ HOSPITAL LAB): SARS Coronavirus 2: NEGATIVE

## 2019-10-26 LAB — LIPASE, BLOOD: Lipase: 30 U/L (ref 11–51)

## 2019-10-26 LAB — LACTIC ACID, PLASMA: Lactic Acid, Venous: 0.6 mmol/L (ref 0.5–1.9)

## 2019-10-26 MED ORDER — IOHEXOL 300 MG/ML  SOLN
100.0000 mL | Freq: Once | INTRAMUSCULAR | Status: AC | PRN
  Administered 2019-10-26: 100 mL via INTRAVENOUS

## 2019-10-26 MED ORDER — FENTANYL CITRATE (PF) 100 MCG/2ML IJ SOLN
50.0000 ug | Freq: Once | INTRAMUSCULAR | Status: AC
  Administered 2019-10-27: 50 ug via INTRAVENOUS
  Filled 2019-10-26: qty 2

## 2019-10-26 MED ORDER — PIPERACILLIN-TAZOBACTAM 3.375 G IVPB 30 MIN
3.3750 g | Freq: Once | INTRAVENOUS | Status: AC
  Administered 2019-10-26: 3.375 g via INTRAVENOUS
  Filled 2019-10-26 (×2): qty 50

## 2019-10-26 NOTE — ED Notes (Signed)
Patient transported to CT 

## 2019-10-26 NOTE — ED Notes (Signed)
Called [facility] spoke to [facility] for Hospitalist consult

## 2019-10-26 NOTE — ED Provider Notes (Signed)
[facility] EMERGENCY DEPARTMENT Provider Note   CSN: [id number] Arrival date & time: 10/26/19  1552     History Chief Complaint  Patient presents with  . Abdominal Pain    Dawn Haley is a 64 y.o. female.  The history is provided by the patient and medical records. No language interpreter was used.  Abdominal Pain Pain location:  LLQ and L flank Pain quality: aching and cramping   Pain radiates to:  Does not radiate Pain severity:  Moderate Onset quality:  Gradual Duration:  3 days Timing:  Constant Progression:  Worsening Chronicity:  Recurrent Relieved by:  Nothing Worsened by:  Nothing Ineffective treatments:  None tried Associated symptoms: vaginal discharge (stool)   Associated symptoms: no chest pain, no chills, no constipation, no cough, no diarrhea, no dysuria, no fatigue, no fever, no flatus, no nausea, no shortness of breath, no vaginal bleeding and no vomiting        Past Medical History:  Diagnosis Date  . Diverticulitis     Patient Active Problem List   Diagnosis Date Noted  . Chest pain 06/06/2015  . GERD (gastroesophageal reflux disease) 06/06/2015  . Costochondritis 06/06/2015    Past Surgical History:  Procedure Laterality Date  . ABDOMINAL HYSTERECTOMY    . APPENDECTOMY    . CYST EXCISION Right 11/02/2015   Procedure: RIGHT INDEX EXCISION MASS ;  Surgeon: Betha Loa, MD;  Location: [facility];  Service: Orthopedics;  Laterality: Right;  . DIAGNOSTIC LAPAROSCOPY    . TONSILLECTOMY    . URETHRAL DILATION       OB History    Gravida  5   Para  2   Term      Preterm      AB  3   Living  2     SAB  3   TAB      Ectopic      Multiple      Live Births              Family History  Problem Relation Age of Onset  . Heart disease Father   . Stroke Father   . Diabetes Father   . Alzheimer's disease Mother     Social History   Tobacco Use  . Smoking status: Never Smoker  . Smokeless  tobacco: Never Used  Vaping Use  . Vaping Use: Never used  Substance Use Topics  . Alcohol use: Yes  . Drug use: Never    Home Medications Prior to Admission medications   Medication Sig Start Date End Date Taking? Authorizing Provider  ASHWAGANDHA PO Take 800 mg by mouth in the morning and at bedtime.     [provider]  CHELATED MAGNESIUM PO Take 200 mg by mouth daily.    [provider]  Cholecalciferol (VITAMIN D) 125 MCG (5000 UT) CAPS Take 5,000 Units by mouth daily.     [provider]  fluticasone (FLONASE) 50 MCG/ACT nasal spray Place 1 spray into both nostrils daily as needed for allergies.     [provider]  GARLIC PO Take 1 capsule by mouth daily.     [provider]  ibuprofen (ADVIL) 200 MG tablet Take 200-400 mg by mouth at bedtime.     [provider]  loratadine-pseudoephedrine (CLARITIN-D 12-HOUR) 5-120 MG tablet Take 1 tablet by mouth daily.    [provider]  LORazepam (ATIVAN) 0.5 MG tablet Take 0.5 mg by mouth at bedtime  as needed for sleep.  05/30/15   [provider]  Multiple Vitamin (MULTIVITAMIN WITH MINERALS) TABS tablet Take 1 tablet by mouth daily.    [provider]  Omega-3 Fatty Acids (FISH OIL) 1200 MG CAPS Take 1,200 mg by mouth daily.     [provider]  OVER THE COUNTER MEDICATION Take 2 capsules by mouth daily. Adrenal Support Blend    [provider]  Probiotic Product (PROBIOTIC PO) Take 1 capsule by mouth daily.     [provider]  Ubiquinol 100 MG CAPS Take 100 mg by mouth daily.     [provider]    Allergies    Codeine and Pentothal [thiopental]  Review of Systems   Review of Systems  Constitutional: Negative for chills, diaphoresis, fatigue and fever.  HENT: Negative for congestion.   Respiratory: Negative for cough, chest tightness, shortness of breath, wheezing and stridor.   Cardiovascular: Negative for chest  pain, palpitations and leg swelling.  Gastrointestinal: Positive for abdominal pain. Negative for constipation, diarrhea, flatus, nausea and vomiting.  Genitourinary: Positive for flank pain and vaginal discharge (stool). Negative for dysuria, frequency, pelvic pain, vaginal bleeding and vaginal pain.  Musculoskeletal: Negative for back pain, neck pain and neck stiffness.  Neurological: Negative for light-headedness and headaches.  Psychiatric/Behavioral: Negative for agitation.  All other systems reviewed and are negative.   Physical Exam Updated Vital Signs BP 119/68 (BP Location: Right Arm)   Pulse 66   Temp 98 F (36.7 C) (Oral)   Resp 16   Ht 5' 10.5" (1.791 m)   Wt 101.5 kg   SpO2 100%   BMI 31.64 kg/m   Physical Exam Vitals and nursing note reviewed.  Constitutional:      General: She is not in acute distress.    Appearance: She is well-developed. She is not ill-appearing, toxic-appearing or diaphoretic.  HENT:     Head: Normocephalic and atraumatic.  Eyes:     Conjunctiva/sclera: Conjunctivae normal.  Cardiovascular:     Rate and Rhythm: Normal rate and regular rhythm.     Heart sounds: Normal heart sounds. No murmur heard.   Pulmonary:     Effort: Pulmonary effort is normal. No respiratory distress.     Breath sounds: Normal breath sounds. No wheezing, rhonchi or rales.  Chest:     Chest wall: No tenderness.  Abdominal:     Palpations: Abdomen is soft.     Tenderness: There is abdominal tenderness in the left lower quadrant. There is no right CVA tenderness, left CVA tenderness, guarding or rebound.  Musculoskeletal:     Cervical back: Neck supple.  Skin:    General: Skin is warm and dry.     Capillary Refill: Capillary refill takes less than 2 seconds.  Neurological:     Mental Status: She is alert.  Psychiatric:        Mood and Affect: Mood normal.     ED Results / Procedures / Treatments   Labs (all labs ordered are listed, but only abnormal  results are displayed) Labs Reviewed  COMPREHENSIVE METABOLIC PANEL - Abnormal; Notable for the following components:      Result Value   Creatinine, Ser 1.06 (*)    Calcium 8.5 (*)    GFR calc non Af Amer 55 (*)    All other components within normal limits  URINALYSIS, ROUTINE W REFLEX MICROSCOPIC - Abnormal; Notable for the following components:   Hgb urine dipstick TRACE (*)  Leukocytes,Ua MODERATE (*)    All other components within normal limits  URINALYSIS, MICROSCOPIC (REFLEX) - Abnormal; Notable for the following components:   Bacteria, UA FEW (*)    All other components within normal limits  SARS CORONAVIRUS 2 BY RT PCR (HOSPITAL ORDER, [place] [facility])  CULTURE, BLOOD (ROUTINE X 2)  CULTURE, BLOOD (ROUTINE X 2)  LIPASE, BLOOD  CBC  LACTIC ACID, PLASMA  LACTIC ACID, PLASMA    EKG None  Radiology CT ABDOMEN PELVIS W CONTRAST  Result Date: 10/26/2019 CLINICAL DATA:  Diverticulitis on antibiotics but with worsening symptoms, rule out progression, abscess or fistula EXAM: CT ABDOMEN AND PELVIS WITH CONTRAST TECHNIQUE: Multidetector CT imaging of the abdomen and pelvis was performed using the standard protocol following bolus administration of intravenous contrast. CONTRAST:  150mL OMNIPAQUE IOHEXOL 300 MG/ML  SOLN COMPARISON:  CT abdomen pelvis 04/21/2019, CT pelvis 10/20/2019 FINDINGS: Lower chest: Bandlike areas of opacity in the lung bases favoring some subsegmental atelectatic change and/or scarring. Otherwise clear lung bases. Normal heart size. No pericardial effusion. Hepatobiliary: Diffuse hepatic hypoattenuation compatible with hepatic steatosis. Sparing seen along the gallbladder fossa. No concerning focal liver lesions. Smooth liver surface contour. Normal gallbladder. No pericholecystic fluid or inflammation. No calcified gallstones or biliary ductal dilatation. Pancreas: Unremarkable. No pancreatic ductal dilatation or surrounding inflammatory  changes. Spleen: Normal in size without focal abnormality. Adrenals/Urinary Tract: Normal adrenal glands. Kidneys enhance uniformly and excrete symmetrically albeit with asymmetric scarring of the left kidney. No concerning focal renal lesions. No urolithiasis or hydronephrosis. There is some mild distal left ureterectasis and urothelial thickening, nonspecific though possibly reactive to the adjacent colonic process. Some slightly asymmetric thickening along the left posterolateral bladder is noted as well. Stomach/Bowel: Distal esophagus, stomach and duodenal sweep are unremarkable. No small bowel wall thickening or dilatation. No evidence of obstruction. The appendix is surgically absent. Redemonstration of the diffuse sigmoid diverticulosis with some slightly more focal segmental thickening and pericolonic stranding centered along the left anterolateral aspect of the distal sigmoid (2/77). This inflammation is in the vicinity of the posterolateral bladder and distal left ureter albeit without discernible abscess, collection, or features to suggest fistulization though the latter is poorly assessed on this study in the absence of enteric contrast media. Vascular/Lymphatic: Atherosclerotic calcifications throughout the abdominal aorta and branch vessels. No aneurysm or ectasia. No enlarged abdominopelvic lymph nodes. Reproductive: Uterus is surgically absent. No concerning adnexal lesions. Other: Small fat containing umbilical hernia. Mild posterior body wall edema. No abdominopelvic free air, fluid, abscess or collection. Small amount of inflammation centered upon the sigmoid, as above. Musculoskeletal: No acute osseous abnormality or suspicious osseous lesion. Multilevel degenerative changes are present in the imaged portions of the spine. IMPRESSION: 1. Redemonstration of the diffuse sigmoid diverticulosis with some mild inflammatory residua in thickening of the distal sigmoid compatible with patient's known  diverticulitis. Overall, degree of inflammation is similar to the comparison study. This inflammation is in the vicinity of the posterolateral bladder and distal left ureter with some reactive changes urothelial thickening and bladder wall thickening, albeit without discernible abscess, collection, or features to suggest fistulization though the latter is poorly assessed on this study in the absence of enteric contrast media. 2. Asymmetric scarring of the left kidney, similar to priors. 3. Hepatic steatosis. 4. Aortic Atherosclerosis (ICD10-I70.0). Electronically Signed   By: Lovena Le M.D.   On: 10/26/2019 19:07    Procedures Procedures (including critical care time)  Medications Ordered in ED Medications  fentaNYL (SUBLIMAZE) injection 50 mcg (has no administration in time range)  iohexol (OMNIPAQUE) 300 MG/ML solution 100 mL (100 mLs Intravenous Contrast Given 10/26/19 1844)  piperacillin-tazobactam (ZOSYN) IVPB 3.375 g ( Intravenous Stopped 10/26/19 2203)    ED Course  I have reviewed the triage vital signs and the nursing notes.  Pertinent labs & imaging results that were available during my care of the patient were reviewed by me and considered in my medical decision making (see chart for details).    MDM Rules/Calculators/A&P                          Dawn Haley is a 64 y.o. female with a past medical history significant for GERD, prior appendectomy, hysterectomy, and known diverticulitis/enterovaginal fistula who is currently on antibiotics who presents for worsening left abdominal pain.  Patient reports that she had a CT scan last Thursday for preoperative evaluation for a partial colectomy that is going be performed in 2 weeks by Dr. Romie Levee.  She reports that she was not having abdominal pain at that time but the CT scan did show some diverticulitis.  Initially, she was not start antibiotics but then on Monday should, she started having some abdominal pain in the left lower  quadrant.  She was started on Cipro and Flagyl and has been taking for the last few days.  She reports of the last 2 days, her symptoms have rapidly worsened and she is now a moderate pain in her left abdomen and left flank.  She reports she has continued to have on and off episodes of stool coming from her vagina likely from the fistula and this is unchanged.  She denies any urinary symptoms.  She denies fevers, chills, nausea, vomiting but does report feeling some malaise.  She reports the pain has continued to worsen and she is concerned that it is worsening despite the antibiotic she is currently taking orally with Cipro and Flagyl.  She denies any URI symptoms or exposure to sick contacts.  She denies other complaints.  On exam, patient does have tenderness in the left lower quadrant and left flank.  No left CVA tenderness present.  No rash seen.  Normal bowel sounds.  Lungs clear and chest nontender.  Patient resting comfortably with reassuring vital signs initially.  As the patient is guarded have surgery in 2 weeks for her colon, I called and spoke with Dr. Maisie Fus with general surgery about her work-up.  Dr. Maisie Fus agrees with getting a CT scan to look for worsened diverticulitis, abscess, or perforation.  She does not feel the rectal contrast to be beneficial today at this time.  If there shows worsened diverticulitis or any complication such as abscess or perforation, she does feel the patient will likely need admission for IV antibiotics to the medicine service for further management.  She reports that the patient would likely not be a candidate for the partial colectomy if diverticulitis is ongoing so resolving this will be a priority.  Patient agrees with getting labs and a CT scan to further evaluate.  Anticipate reassessment after work-up.  9:10 PM Patient CT scan returned showing persistent diverticulitis with some new inflammation near the ureter.  I suspect that the diverticulitis is  causing a pain similar to kidney stones with the bladder and ureteral inflammation causing more discomfort.  Patient has had this evolution despite being on oral antibiotics and given her upcoming surgery, she will be started  on IV antibiotics and [facility] antibiotic treatment of the evolving diverticulitis and for symptomatic management.  Patient will be admitted for admission for failure of outpatient antibiotics for diverticulitis with worsening symptoms.  Final Clinical Impression(s) / ED Diagnoses Final diagnoses:  Left lower quadrant abdominal pain  Diverticulitis     Clinical Impression: 1. Left lower quadrant abdominal pain   2. Diverticulitis     Disposition: Admit  This note was prepared with assistance of Dragon voice recognition software. Occasional wrong-word or sound-a-like substitutions may have occurred due to the inherent limitations of voice recognition software.     Yasmine Kilbourne, Canary Brimhristopher J, MD 10/26/19 [phone]

## 2019-10-26 NOTE — ED Triage Notes (Signed)
Pt here with diverticulitis under the care of a GI doctor. Pain is getting worse even with abx. Scheduled for partial colectomy at the end of the month.

## 2019-10-27 DIAGNOSIS — Z20822 Contact with and (suspected) exposure to covid-19: Secondary | ICD-10-CM | POA: Diagnosis present

## 2019-10-27 DIAGNOSIS — N824 Other female intestinal-genital tract fistulae: Secondary | ICD-10-CM | POA: Diagnosis present

## 2019-10-27 DIAGNOSIS — Z820 Family history of epilepsy and other diseases of the nervous system: Secondary | ICD-10-CM | POA: Diagnosis not present

## 2019-10-27 DIAGNOSIS — G4700 Insomnia, unspecified: Secondary | ICD-10-CM | POA: Diagnosis present

## 2019-10-27 DIAGNOSIS — Z79899 Other long term (current) drug therapy: Secondary | ICD-10-CM | POA: Diagnosis not present

## 2019-10-27 DIAGNOSIS — K5792 Diverticulitis of intestine, part unspecified, without perforation or abscess without bleeding: Secondary | ICD-10-CM | POA: Diagnosis present

## 2019-10-27 DIAGNOSIS — Z823 Family history of stroke: Secondary | ICD-10-CM | POA: Diagnosis not present

## 2019-10-27 DIAGNOSIS — Z8249 Family history of ischemic heart disease and other diseases of the circulatory system: Secondary | ICD-10-CM | POA: Diagnosis not present

## 2019-10-27 DIAGNOSIS — Z833 Family history of diabetes mellitus: Secondary | ICD-10-CM | POA: Diagnosis not present

## 2019-10-27 DIAGNOSIS — N289 Disorder of kidney and ureter, unspecified: Secondary | ICD-10-CM | POA: Diagnosis present

## 2019-10-27 DIAGNOSIS — K5732 Diverticulitis of large intestine without perforation or abscess without bleeding: Principal | ICD-10-CM

## 2019-10-27 DIAGNOSIS — Z885 Allergy status to narcotic agent status: Secondary | ICD-10-CM | POA: Diagnosis not present

## 2019-10-27 DIAGNOSIS — K219 Gastro-esophageal reflux disease without esophagitis: Secondary | ICD-10-CM | POA: Diagnosis present

## 2019-10-27 DIAGNOSIS — Z888 Allergy status to other drugs, medicaments and biological substances status: Secondary | ICD-10-CM | POA: Diagnosis not present

## 2019-10-27 LAB — BASIC METABOLIC PANEL
Anion gap: 8 (ref 5–15)
BUN: 7 mg/dL — ABNORMAL LOW (ref 8–23)
CO2: 26 mmol/L (ref 22–32)
Calcium: 8.7 mg/dL — ABNORMAL LOW (ref 8.9–10.3)
Chloride: 104 mmol/L (ref 98–111)
Creatinine, Ser: 0.93 mg/dL (ref 0.44–1.00)
GFR calc Af Amer: >60 mL/min (ref >60)
GFR calc non Af Amer: >60 mL/min (ref >60)
Glucose, Bld: 127 mg/dL — ABNORMAL HIGH (ref 70–99)
Potassium: 3.6 mmol/L (ref 3.5–5.1)
Sodium: 138 mmol/L (ref 135–145)

## 2019-10-27 LAB — HIV ANTIBODY (ROUTINE TESTING W REFLEX): HIV Screen 4th Generation wRfx: NONREACTIVE

## 2019-10-27 LAB — MAGNESIUM: Magnesium: 1.9 mg/dL (ref 1.7–2.4)

## 2019-10-27 MED ORDER — PIPERACILLIN-TAZOBACTAM 3.375 G IVPB 30 MIN: 3.3750 g | Freq: Three times a day (TID) | INTRAVENOUS | Status: DC

## 2019-10-27 MED ORDER — ACETAMINOPHEN 650 MG RE SUPP: 650.0000 mg | Freq: Four times a day (QID) | RECTAL | Status: DC | PRN

## 2019-10-27 MED ORDER — PIPERACILLIN-TAZOBACTAM 3.375 G IVPB
3.3750 g | Freq: Three times a day (TID) | INTRAVENOUS | Status: DC
  Administered 2019-10-27 – 2019-10-29 (×7): 3.375 g via INTRAVENOUS
  Filled 2019-10-27 (×7): qty 50

## 2019-10-27 MED ORDER — SODIUM CHLORIDE 0.9 % IV SOLN: INTRAVENOUS | Status: DC

## 2019-10-27 MED ORDER — LORAZEPAM 0.5 MG PO TABS
0.5000 mg | ORAL_TABLET | Freq: Every evening | ORAL | Status: DC | PRN
  Administered 2019-10-27 – 2019-10-28 (×2): 0.5 mg via ORAL
  Filled 2019-10-27 (×2): qty 1

## 2019-10-27 MED ORDER — ACETAMINOPHEN 325 MG PO TABS
650.0000 mg | ORAL_TABLET | Freq: Four times a day (QID) | ORAL | Status: DC | PRN
  Administered 2019-10-27 – 2019-10-29 (×6): 650 mg via ORAL
  Filled 2019-10-27 (×6): qty 2

## 2019-10-27 MED ORDER — SODIUM CHLORIDE 0.9% FLUSH
3.0000 mL | Freq: Two times a day (BID) | INTRAVENOUS | Status: DC
  Administered 2019-10-28: 3 mL via INTRAVENOUS

## 2019-10-27 MED ORDER — ONDANSETRON HCL 4 MG/2ML IJ SOLN: 4.0000 mg | Freq: Four times a day (QID) | INTRAMUSCULAR | Status: DC | PRN

## 2019-10-27 MED ORDER — ALBUTEROL SULFATE (2.5 MG/3ML) 0.083% IN NEBU: 2.5000 mg | INHALATION_SOLUTION | Freq: Four times a day (QID) | RESPIRATORY_TRACT | Status: DC | PRN

## 2019-10-27 MED ORDER — FENTANYL CITRATE (PF) 100 MCG/2ML IJ SOLN
50.0000 ug | INTRAMUSCULAR | Status: DC | PRN
  Administered 2019-10-27 (×2): 50 ug via INTRAVENOUS
  Filled 2019-10-27 (×2): qty 2

## 2019-10-27 MED ORDER — RISAQUAD PO CAPS
1.0000 | ORAL_CAPSULE | Freq: Every day | ORAL | Status: DC
  Administered 2019-10-27 – 2019-10-29 (×3): 1 via ORAL
  Filled 2019-10-27 (×3): qty 1

## 2019-10-27 MED ORDER — FENTANYL CITRATE (PF) 100 MCG/2ML IJ SOLN: 25.0000 ug | INTRAMUSCULAR | Status: DC | PRN

## 2019-10-27 MED ORDER — CALCIUM GLUCONATE-NACL 1-0.675 GM/50ML-% IV SOLN
1.0000 g | Freq: Once | INTRAVENOUS | Status: AC
  Administered 2019-10-27: 1000 mg via INTRAVENOUS
  Filled 2019-10-27: qty 50

## 2019-10-27 MED ORDER — ENOXAPARIN SODIUM 40 MG/0.4ML ~~LOC~~ SOLN
40.0000 mg | SUBCUTANEOUS | Status: DC
  Administered 2019-10-27: 40 mg via SUBCUTANEOUS
  Filled 2019-10-27 (×3): qty 0.4

## 2019-10-27 MED ORDER — ONDANSETRON HCL 4 MG PO TABS: 4.0000 mg | ORAL_TABLET | Freq: Four times a day (QID) | ORAL | Status: DC | PRN

## 2019-10-27 NOTE — H&P (Addendum)
History and Physical    Dawn Haley ZOX:[id number] DOB: 10-20-1955 DOA: 10/26/2019  Referring MD/NP/PA: John Giovanni MD PCP: Catha Gosselin, MD  Patient coming from: [facility] transfer  Chief Complaint: Abdominal pain  I have personally briefly reviewed patient's old medical records in [facility]   HPI: Dawn Haley is a 64 y.o. female with medical history significant of diverticulosis and diverticulitis with enterovaginal fistula presents with complaints of progressively worsening left-sided abdominal pain over the last 4 days.  She had a rectal contrast CT performed 1 week ago as preop for colectomy scheduled on June 30th with Dr. Romie Levee of general surgery.  It showed mild sigmoid diverticulitis at that time with no findings of fistulous communication, but patient was not having pain at that time.  Her pain initially started in the left lower quadrant and had moved up and to her left flank area.  At home she had been taking up to 4 Advil/day and MiraLAX without relief of symptoms.  On 6/14, she called into the office and was prescribed antibiotics of metronidazole and ciprofloxacin that she had been taking as prescribed.  Despite this pain continued to worsen.  She has had a fistula since sometime in April for which she intermittent has stool coming from her vagina as well as blood.  Patient notes that she is having 2-3 stools per day.  Denies having any fever, chest pain, shortness of breath, cough, or dysuria.  ED Course: Vital signs were noted to be stable.  Labs from 6/16 revealed normal CBC, BUN 13, creatinine 1.06 (baseline Cr around 0.8), calcium 8.5, and lactic acid 0.6.  Urinalysis positive for moderate leukocytes, few bacteria, and greater than 50 WBCs.  CT scan of the abdomen and pelvis showed diffuse sigmoid diverticulitis with some chronic inflammation of the distal sigmoid near the bladder concerning for diverticulitis without discernible abscess or collection  appreciated.  Case was discussed with Dr. Maisie Fus of surgery who recommended treating with IV antibiotics.  Blood cultures were obtained and patient was started on Zosyn and fentanyl as needed for pain.   Review of Systems  Constitutional: Negative for chills, fever and malaise/fatigue.  HENT: Negative for ear discharge and nosebleeds.   Eyes: Negative for photophobia and pain.  Respiratory: Negative for cough and shortness of breath.   Cardiovascular: Negative for chest pain and leg swelling.  Gastrointestinal: Positive for abdominal pain. Negative for nausea and vomiting.  Genitourinary: Negative for dysuria.       Positive for intermittent stool from vagina occasionally associated with mild bleeding  Musculoskeletal: Negative for falls.  Skin: Negative for itching.  Neurological: Negative for speech change and focal weakness.  Endo/Heme/Allergies: Negative for polydipsia.  Psychiatric/Behavioral: Negative for memory loss and substance abuse.    Past Medical History:  Diagnosis Date  . Diverticulitis     Past Surgical History:  Procedure Laterality Date  . ABDOMINAL HYSTERECTOMY    . APPENDECTOMY    . CYST EXCISION Right 11/02/2015   Procedure: RIGHT INDEX EXCISION MASS ;  Surgeon: Betha Loa, MD;  Location: [facility];  Service: Orthopedics;  Laterality: Right;  . DIAGNOSTIC LAPAROSCOPY    . TONSILLECTOMY    . URETHRAL DILATION       reports that she has never smoked. She has never used smokeless tobacco. She reports current alcohol use. She reports that she does not use drugs.  Allergies  Allergen Reactions  . Codeine Hives, Rash and Other (See Comments)  Intensifies pain instead of relieving  . Pentothal [Thiopental] Rash    Family History  Problem Relation Age of Onset  . Heart disease Father   . Stroke Father   . Diabetes Father   . Alzheimer's disease Mother     Prior to Admission medications   Medication Sig Start Date End Date Taking?  Authorizing Provider  ASHWAGANDHA PO Take 800 mg by mouth in the morning and at bedtime.     [provider]  CHELATED MAGNESIUM PO Take 200 mg by mouth daily.    [provider]  Cholecalciferol (VITAMIN D) 125 MCG (5000 UT) CAPS Take 5,000 Units by mouth daily.     [provider]  fluticasone (FLONASE) 50 MCG/ACT nasal spray Place 1 spray into both nostrils daily as needed for allergies.     [provider]  GARLIC PO Take 1 capsule by mouth daily.     [provider]  ibuprofen (ADVIL) 200 MG tablet Take 200-400 mg by mouth at bedtime.     [provider]  loratadine-pseudoephedrine (CLARITIN-D 12-HOUR) 5-120 MG tablet Take 1 tablet by mouth daily.    [provider]  LORazepam (ATIVAN) 0.5 MG tablet Take 0.5 mg by mouth at bedtime as needed for sleep.  05/30/15   [provider]  Multiple Vitamin (MULTIVITAMIN WITH MINERALS) TABS tablet Take 1 tablet by mouth daily.    [provider]  Omega-3 Fatty Acids (FISH OIL) 1200 MG CAPS Take 1,200 mg by mouth daily.     [provider]  OVER THE COUNTER MEDICATION Take 2 capsules by mouth daily. Adrenal Support Blend    [provider]  Probiotic Product (PROBIOTIC PO) Take 1 capsule by mouth daily.     [provider]  Ubiquinol 100 MG CAPS Take 100 mg by mouth daily.     [provider]    Physical Exam:  Constitutional: Older female who appears to be in no acute distress at this time Vitals:   10/26/19 2214 10/27/19 0250 10/27/19 0525 10/27/19 0713  BP: 131/73 122/76 120/64 (!) 145/77  Pulse: 78 76 65 62  Resp: 20 16 16 16   Temp: 98.2 F (36.8 C) (!) 97.5 F (36.4 C) 98.2 F (36.8 C) 97.6 F (36.4 C)  TempSrc: Oral Oral Oral   SpO2: 99% 99% 99% 100%  Weight:      Height:       Eyes: PERRL, lids and conjunctivae normal ENMT: Mucous membranes are moist. Posterior pharynx clear of any exudate or lesions.  Neck: normal,  supple, no masses, no thyromegaly Respiratory: clear to auscultation bilaterally, no wheezing, no crackles. Normal respiratory effort. No accessory muscle use.  Cardiovascular: Regular rate and rhythm, no murmurs / rubs / gallops. No extremity edema. 2+ pedal pulses. No carotid bruits.  Abdomen: Mild tenderness to palpation noted of the mid outer left abdomen.  Bowel sounds present.  No guarding appreciated. Musculoskeletal: no clubbing / cyanosis. No joint deformity upper and lower extremities. Good ROM, no contractures. Normal muscle tone.  Skin: no rashes, lesions, ulcers. No induration Neurologic: CN 2-12 grossly intact. Sensation intact, DTR normal. Strength 5/5 in all 4.  Psychiatric: Normal judgment and insight. Alert and oriented x 3. Normal mood.     Labs on Admission: I have personally reviewed following labs and imaging studies  CBC: Recent Labs  Lab 10/26/19 1737  WBC 6.6  HGB 13.8  HCT 42.0  MCV 90.5  PLT 254   Basic  Metabolic Panel: Recent Labs  Lab 10/26/19 1737  NA 136  K 3.6  CL 102  CO2 25  GLUCOSE 94  BUN 13  CREATININE 1.06*  CALCIUM 8.5*   GFR: Estimated Creatinine Clearance: 68.8 mL/min (A) (by C-G formula based on SCr of 1.06 mg/dL (H)). Liver Function Tests: Recent Labs  Lab 10/26/19 1737  AST 23  ALT 30  ALKPHOS 66  BILITOT 0.4  PROT 7.3  ALBUMIN 4.2   Recent Labs  Lab 10/26/19 1737  LIPASE 30   No results for input(s): AMMONIA in the last 168 hours. Coagulation Profile: No results for input(s): INR, PROTIME in the last 168 hours. Cardiac Enzymes: No results for input(s): CKTOTAL, CKMB, CKMBINDEX, TROPONINI in the last 168 hours. BNP (last 3 results) No results for input(s): PROBNP in the last 8760 hours. HbA1C: No results for input(s): HGBA1C in the last 72 hours. CBG: No results for input(s): GLUCAP in the last 168 hours. Lipid Profile: No results for input(s): CHOL, HDL, LDLCALC, TRIG, CHOLHDL, LDLDIRECT in the last 72  hours. Thyroid Function Tests: No results for input(s): TSH, T4TOTAL, FREET4, T3FREE, THYROIDAB in the last 72 hours. Anemia Panel: No results for input(s): VITAMINB12, FOLATE, FERRITIN, TIBC, IRON, RETICCTPCT in the last 72 hours. Urine analysis:    Component Value Date/Time   COLORURINE YELLOW 10/26/2019 1737   APPEARANCEUR CLEAR 10/26/2019 1737   LABSPEC 1.010 10/26/2019 1737   PHURINE 6.0 10/26/2019 1737   GLUCOSEU NEGATIVE 10/26/2019 1737   HGBUR TRACE (A) 10/26/2019 1737   BILIRUBINUR NEGATIVE 10/26/2019 1737   KETONESUR NEGATIVE 10/26/2019 1737   PROTEINUR NEGATIVE 10/26/2019 1737   NITRITE NEGATIVE 10/26/2019 1737   LEUKOCYTESUR MODERATE (A) 10/26/2019 1737   Sepsis Labs: Recent Results (from the past 240 hour(s))  SARS Coronavirus 2 by RT PCR (hospital order, performed in [facility] hospital lab) Nasopharyngeal Nasopharyngeal Swab     Status: None   Collection Time: 10/26/19  9:18 PM   Specimen: Nasopharyngeal Swab  Result Value Ref Range Status   SARS Coronavirus 2 NEGATIVE NEGATIVE Final    Comment: (NOTE) SARS-CoV-2 target nucleic acids are NOT DETECTED.  The SARS-CoV-2 RNA is generally detectable in upper and lower respiratory specimens during the acute phase of infection. The lowest concentration of SARS-CoV-2 viral copies this assay can detect is 250 copies / mL. A negative result does not preclude SARS-CoV-2 infection and should not be used as the sole basis for treatment or other patient management decisions.  A negative result may occur with improper specimen collection / handling, submission of specimen other than nasopharyngeal swab, presence of viral mutation(s) within the areas targeted by this assay, and inadequate number of viral copies (<250 copies / mL). A negative result must be combined with clinical observations, patient history, and epidemiological information.  Fact Sheet for Patients:   BoilerBrush.com.cy  Fact Sheet  for Healthcare Providers: https://pope.com/  This test is not yet approved or  cleared by the [place] FDA and has been authorized for detection and/or diagnosis of SARS-CoV-2 by FDA under an Emergency Use Authorization (EUA).  This EUA will remain in effect (meaning this test can be used) for the duration of the COVID-19 declaration under Section 564(b)(1) of the Act, 21 U.S.C. section 360bbb-3(b)(1), unless the authorization is terminated or revoked sooner.  Performed at [facility], [address] [place], [place] [address]      Radiological Exams on Admission: CT ABDOMEN PELVIS W CONTRAST  Result Date: 10/26/2019  CLINICAL DATA:  Diverticulitis on antibiotics but with worsening symptoms, rule out progression, abscess or fistula EXAM: CT ABDOMEN AND PELVIS WITH CONTRAST TECHNIQUE: Multidetector CT imaging of the abdomen and pelvis was performed using the standard protocol following bolus administration of intravenous contrast. CONTRAST:  161mL OMNIPAQUE IOHEXOL 300 MG/ML  SOLN COMPARISON:  CT abdomen pelvis 04/21/2019, CT pelvis 10/20/2019 FINDINGS: Lower chest: Bandlike areas of opacity in the lung bases favoring some subsegmental atelectatic change and/or scarring. Otherwise clear lung bases. Normal heart size. No pericardial effusion. Hepatobiliary: Diffuse hepatic hypoattenuation compatible with hepatic steatosis. Sparing seen along the gallbladder fossa. No concerning focal liver lesions. Smooth liver surface contour. Normal gallbladder. No pericholecystic fluid or inflammation. No calcified gallstones or biliary ductal dilatation. Pancreas: Unremarkable. No pancreatic ductal dilatation or surrounding inflammatory changes. Spleen: Normal in size without focal abnormality. Adrenals/Urinary Tract: Normal adrenal glands. Kidneys enhance uniformly and excrete symmetrically albeit with asymmetric scarring of the left kidney. No concerning focal renal  lesions. No urolithiasis or hydronephrosis. There is some mild distal left ureterectasis and urothelial thickening, nonspecific though possibly reactive to the adjacent colonic process. Some slightly asymmetric thickening along the left posterolateral bladder is noted as well. Stomach/Bowel: Distal esophagus, stomach and duodenal sweep are unremarkable. No small bowel wall thickening or dilatation. No evidence of obstruction. The appendix is surgically absent. Redemonstration of the diffuse sigmoid diverticulosis with some slightly more focal segmental thickening and pericolonic stranding centered along the left anterolateral aspect of the distal sigmoid (2/77). This inflammation is in the vicinity of the posterolateral bladder and distal left ureter albeit without discernible abscess, collection, or features to suggest fistulization though the latter is poorly assessed on this study in the absence of enteric contrast media. Vascular/Lymphatic: Atherosclerotic calcifications throughout the abdominal aorta and branch vessels. No aneurysm or ectasia. No enlarged abdominopelvic lymph nodes. Reproductive: Uterus is surgically absent. No concerning adnexal lesions. Other: Small fat containing umbilical hernia. Mild posterior body wall edema. No abdominopelvic free air, fluid, abscess or collection. Small amount of inflammation centered upon the sigmoid, as above. Musculoskeletal: No acute osseous abnormality or suspicious osseous lesion. Multilevel degenerative changes are present in the imaged portions of the spine. IMPRESSION: 1. Redemonstration of the diffuse sigmoid diverticulosis with some mild inflammatory residua in thickening of the distal sigmoid compatible with patient's known diverticulitis. Overall, degree of inflammation is similar to the comparison study. This inflammation is in the vicinity of the posterolateral bladder and distal left ureter with some reactive changes urothelial thickening and bladder  wall thickening, albeit without discernible abscess, collection, or features to suggest fistulization though the latter is poorly assessed on this study in the absence of enteric contrast media. 2. Asymmetric scarring of the left kidney, similar to priors. 3. Hepatic steatosis. 4. Aortic Atherosclerosis (ICD10-I70.0). Electronically Signed   By: Lovena Le M.D.   On: 10/26/2019 19:07    CT scan of the abdomen: Independently reviewed.  Inflammation around the distal sigmoid colon  Assessment/Plan  Anterovaginal fistula  Sigmoid diverticulitis: Acute on chronic.  She had been placed on ciprofloxacin and metronidazole p.o. in the outpatient setting without improvement in symptoms.  CT scan showed inflammation of the sigmoid colon near the bladder suggestive of continue diverticulitis.  Patient was started on empiric antibiotics Zosyn IV.  Dr. Marcello Moores of General surgery was notified regarding patient.  Discussed admission with Dr. Marcello Moores who recommended no need for formal surgery consult, suspects pain possibly coming from stricture, and recommended continue with plan for surgery on  June 30. -Admit to MedSurg bed -Follow-up blood cultures -Monitor intake and output -Clear liquid diet and advance as tolerated -Continue antibiotics of Zosyn -IV fentanyl as needed for pain -Continue probiotics    Renal insufficiency: Acute.  Patient baseline creatinine appears to be around 0.8.  Patient presented with creatinine elevated up to 1.06 with BUN 13.  Elevation in creatinine at this time did not appear greater than 0.3 to meet criteria for acute kidney injury. -Gentle IV fluids with normal saline at 75 mL/h -Monitor kidney function daily  Hypocalcemia: Acute.  On 6/16 calcium was 8.5 with albumin levels noted to be within normal limits. -Give 1 g of calcium gluconate IV -Continue to monitor and replace as needed  Insomnia -Continue Ativan as needed for sleep   DVT prophylaxis: Lovenox Code Status:  Full Family Communication: Patient declined need to update her husband at this time. Disposition Plan: Possible discharge home in 2 to 3 days Consults called: General surgery Admission status: Inpatient  Clydie Braunondell A Anamarie Hunn MD [facility] Pager [phone]   If 7PM-7AM, please contact night-coverage [email or web address] Password [email or web address]  10/27/2019, 7:14 AM

## 2019-10-27 NOTE — ED Notes (Signed)
Attempted to call report to floor, nurse not available.

## 2019-10-27 NOTE — ED Notes (Signed)
PT aware of bed not available unitl in AM. Comfort measures so pt can sleep.

## 2019-10-28 LAB — BASIC METABOLIC PANEL
Anion gap: 7 (ref 5–15)
BUN: <5 mg/dL — ABNORMAL LOW (ref 8–23)
CO2: 23 mmol/L (ref 22–32)
Calcium: 8.6 mg/dL — ABNORMAL LOW (ref 8.9–10.3)
Chloride: 109 mmol/L (ref 98–111)
Creatinine, Ser: 0.95 mg/dL (ref 0.44–1.00)
GFR calc Af Amer: >60 mL/min (ref >60)
GFR calc non Af Amer: >60 mL/min (ref >60)
Glucose, Bld: 108 mg/dL — ABNORMAL HIGH (ref 70–99)
Potassium: 4 mmol/L (ref 3.5–5.1)
Sodium: 139 mmol/L (ref 135–145)

## 2019-10-28 LAB — CBC
HCT: 39.6 % (ref 36.0–46.0)
Hemoglobin: 13 g/dL (ref 12.0–15.0)
MCH: 29.5 pg (ref 26.0–34.0)
MCHC: 32.8 g/dL (ref 30.0–36.0)
MCV: 90 fL (ref 80.0–100.0)
Platelets: 225 10*3/uL (ref 150–400)
RBC: 4.4 MIL/uL (ref 3.87–5.11)
RDW: 13.6 % (ref 11.5–15.5)
WBC: 4.5 10*3/uL (ref 4.0–10.5)
nRBC: 0 % (ref 0.0–0.2)

## 2019-10-28 LAB — VITAMIN D 25 HYDROXY (VIT D DEFICIENCY, FRACTURES): Vit D, 25-Hydroxy: 28.02 ng/mL — ABNORMAL LOW (ref 30–100)

## 2019-10-28 NOTE — Progress Notes (Signed)
PROGRESS NOTE  SHADE RIVENBARK VOJ:[id number] DOB: February 02, 1956   PCP: Hulan Fess, MD  Patient is from: Home  DOA: 10/26/2019 LOS: 1  Brief Narrative / Interim history: T[age]cell-year-old female with history of diverticulosis/diverticulitis and enterovaginal fistula presenting with progressive LLQ and left flank pain for about 4 days.  She had a rectal contrast CT study as preop work-up for enterovaginal fistula scheduled for June 30 with Dr. Leighton Ruff of general surgery.  CT revealed mild sigmoid diverticulitis without complication.  She was started on Cipro and Flagyl without improvement in the pain which prompted her to come to ED.  In the ED, stable vital signs.  Basic labs including CBC and BMP without significant finding other than mildly elevated creatinine.  CT abdomen and pelvis revealed diffuse sigmoid diverticulitis without abscess or perforation.  Case discussed with Dr. Marcello Moores of surgery who recommended treatment with IV antibiotics.  She was started on IV Zosyn and admitted.  Subjective: Seen and examined earlier this morning.  No major events overnight of this morning.  Feels better today.  She likes to try full liquid diet.  Soft bowel movement.  Denies blood in stool.  Denies nausea or vomiting.  Still with some pain along the left flank.  No UTI symptoms or hematuria.  Objective: Vitals:   10/27/19 1423 10/27/19 2034 10/28/19 0501 10/28/19 1438  BP: 137/71 124/77 126/78 119/63  Pulse: 61 (!) 59 62 63  Resp: 16 18 17 15   Temp: 98 F (36.7 C) 98.1 F (36.7 C) 98 F (36.7 C) 97.7 F (36.5 C)  TempSrc: Oral Oral Oral Oral  SpO2: 95% 97% 98% 99%  Weight:      Height:        Intake/Output Summary (Last 24 hours) at 10/28/2019 1441 Last data filed at 10/28/2019 1439 Gross per 24 hour  Intake 2607.36 ml  Output --  Net 2607.36 ml   Filed Weights   10/26/19 1615  Weight: 101.5 kg    Examination:  GENERAL: No apparent distress.  Nontoxic. HEENT: MMM.  Vision  and hearing grossly intact.  NECK: Supple.  No apparent JVD.  RESP:  No IWOB.  Fair aeration bilaterally. CVS:  RRR. Heart sounds normal.  ABD/GI/GU: BS+. Abd soft, NTND.  LLQ tenderness.  Some tenderness over left paraspinal muscles.  No CVA tenderness.  MSK/EXT:  Moves extremities. No apparent deformity. No edema.  SKIN: no apparent skin lesion or wound NEURO: Awake, alert and oriented appropriately.  No apparent focal neuro deficit. PSYCH: Calm. Normal affect.   Procedures:  None  Microbiology summarized: COVID-19 PCR negative. Blood cultures NGTD  Assessment & Plan: Acute sigmoid diverticulitis-failed outpatient therapy with oral antibiotics.  No abscess or signs of perforation on CT abdomen and pelvis. -Continue IV Zosyn per Dr. Manon Hilding recommendation.  Will eventually transition to Augmentin -Advance diet to full liquid diet -Probiotics -Pain control  Enteroaginal fistula-followed by Dr. Marcello Moores, general surgery outpatient.  Not able to localize the enteric opening of fistula on imaging yet. -Outpatient follow-up  Mild hypocalcemia: Received 1 g of calcium gluconate IV. -Check vitamin D and PTH  Insomnia -As needed meds   Body mass index is 31.64 kg/m.         DVT prophylaxis:  enoxaparin (LOVENOX) injection 40 mg Start: 10/27/19 1000  Code Status: Full code Family Communication: Patient and/or RN. Available if any question.  Status is: Inpatient  Remains inpatient appropriate because:IV treatments appropriate due to intensity of illness or inability to take PO  Dispo: The patient is from: Home              Anticipated d/c is to: Home              Anticipated d/c date is: 1 day              Patient currently is not medically stable to d/c.       Consultants:  General surgery by EDP   Sch Meds:  Scheduled Meds: . acidophilus  1 capsule Oral Daily  . enoxaparin (LOVENOX) injection  40 mg Subcutaneous Q24H  . sodium chloride flush  3 mL  Intravenous Q12H   Continuous Infusions: . piperacillin-tazobactam (ZOSYN)  IV 3.375 g (10/28/19 1410)   PRN Meds:.acetaminophen **OR** acetaminophen, albuterol, fentaNYL (SUBLIMAZE) injection, LORazepam, ondansetron **OR** ondansetron (ZOFRAN) IV  Antimicrobials: Anti-infectives (From admission, onward)   Start     Dose/Rate Route Frequency Ordered Stop   10/27/19 0745  piperacillin-tazobactam (ZOSYN) IVPB 3.375 g     Discontinue     3.375 g 12.5 mL/hr over 240 Minutes Intravenous Every 8 hours 10/27/19 0732     10/27/19 0730  piperacillin-tazobactam (ZOSYN) IVPB 3.375 g  Status:  Discontinued        3.375 g 100 mL/hr over 30 Minutes Intravenous Every 8 hours 10/27/19 0728 10/27/19 0732   10/26/19 2115  piperacillin-tazobactam (ZOSYN) IVPB 3.375 g        3.375 g 100 mL/hr over 30 Minutes Intravenous  Once 10/26/19 2112 10/26/19 2203       I have personally reviewed the following labs and images: CBC: Recent Labs  Lab 10/26/19 1737 10/28/19 0310  WBC 6.6 4.5  HGB 13.8 13.0  HCT 42.0 39.6  MCV 90.5 90.0  PLT 254 225   BMP &GFR Recent Labs  Lab 10/26/19 1737 10/27/19 1051 10/28/19 0310  NA 136 138 139  K 3.6 3.6 4.0  CL 102 104 109  CO2 25 26 23   GLUCOSE 94 127* 108*  BUN 13 7* <5*  CREATININE 1.06* 0.93 0.95  CALCIUM 8.5* 8.7* 8.6*  MG  --  1.9  --    Estimated Creatinine Clearance: 76.8 mL/min (by C-G formula based on SCr of 0.95 mg/dL). Liver & Pancreas: Recent Labs  Lab 10/26/19 1737  AST 23  ALT 30  ALKPHOS 66  BILITOT 0.4  PROT 7.3  ALBUMIN 4.2   Recent Labs  Lab 10/26/19 1737  LIPASE 30   No results for input(s): AMMONIA in the last 168 hours. Diabetic: No results for input(s): HGBA1C in the last 72 hours. No results for input(s): GLUCAP in the last 168 hours. Cardiac Enzymes: No results for input(s): CKTOTAL, CKMB, CKMBINDEX, TROPONINI in the last 168 hours. No results for input(s): PROBNP in the last 8760 hours. Coagulation  Profile: No results for input(s): INR, PROTIME in the last 168 hours. Thyroid Function Tests: No results for input(s): TSH, T4TOTAL, FREET4, T3FREE, THYROIDAB in the last 72 hours. Lipid Profile: No results for input(s): CHOL, HDL, LDLCALC, TRIG, CHOLHDL, LDLDIRECT in the last 72 hours. Anemia Panel: No results for input(s): VITAMINB12, FOLATE, FERRITIN, TIBC, IRON, RETICCTPCT in the last 72 hours. Urine analysis:    Component Value Date/Time   COLORURINE YELLOW 10/26/2019 1737   APPEARANCEUR CLEAR 10/26/2019 1737   LABSPEC 1.010 10/26/2019 1737   PHURINE 6.0 10/26/2019 1737   GLUCOSEU NEGATIVE 10/26/2019 1737   HGBUR TRACE (A) 10/26/2019 1737   BILIRUBINUR NEGATIVE 10/26/2019 1737   KETONESUR NEGATIVE 10/26/2019 1737  PROTEINUR NEGATIVE 10/26/2019 1737   NITRITE NEGATIVE 10/26/2019 1737   LEUKOCYTESUR MODERATE (A) 10/26/2019 1737   Sepsis Labs: Invalid input(s): PROCALCITONIN, LACTICIDVEN  Microbiology: Recent Results (from the past 240 hour(s))  Blood culture (routine x 2)     Status: None (Preliminary result)   Collection Time: 10/26/19  9:18 PM   Specimen: BLOOD  Result Value Ref Range Status   Specimen Description   Final    BLOOD BLOOD RIGHT HAND Performed at [facility], [address] [place], [place] [address]    Special Requests   Final    BOTTLES DRAWN AEROBIC AND ANAEROBIC Blood Culture adequate volume Performed at [facility], [address], [place], [place] [address]    Culture   Final    NO GROWTH 1 DAY Performed at [facility], [address] [place], [place] [address]    Report Status PENDING  Incomplete  SARS Coronavirus 2 by RT PCR (hospital order, performed in [facility] hospital lab) Nasopharyngeal Nasopharyngeal Swab     Status: None   Collection Time: 10/26/19  9:18 PM   Specimen: Nasopharyngeal Swab  Result Value Ref Range Status   SARS Coronavirus 2 NEGATIVE NEGATIVE Final    Comment: (NOTE) SARS-CoV-2  target nucleic acids are NOT DETECTED.  The SARS-CoV-2 RNA is generally detectable in upper and lower respiratory specimens during the acute phase of infection. The lowest concentration of SARS-CoV-2 viral copies this assay can detect is 250 copies / mL. A negative result does not preclude SARS-CoV-2 infection and should not be used as the sole basis for treatment or other patient management decisions.  A negative result may occur with improper specimen collection / handling, submission of specimen other than nasopharyngeal swab, presence of viral mutation(s) within the areas targeted by this assay, and inadequate number of viral copies (<250 copies / mL). A negative result must be combined with clinical observations, patient history, and epidemiological information.  Fact Sheet for Patients:   BoilerBrush.com.cy  Fact Sheet for Healthcare Providers: https://pope.com/  This test is not yet approved or  cleared by the [place] FDA and has been authorized for detection and/or diagnosis of SARS-CoV-2 by FDA under an Emergency Use Authorization (EUA).  This EUA will remain in effect (meaning this test can be used) for the duration of the COVID-19 declaration under Section 564(b)(1) of the Act, 21 U.S.C. section 360bbb-3(b)(1), unless the authorization is terminated or revoked sooner.  Performed at [facility], [address] [place], [place] [address]   Blood culture (routine x 2)     Status: None (Preliminary result)   Collection Time: 10/26/19  9:35 PM   Specimen: BLOOD LEFT ARM  Result Value Ref Range Status   Specimen Description   Final    BLOOD LEFT ARM Performed at [facility], [address] [place], [place] [address]    Special Requests   Final    BOTTLES DRAWN AEROBIC AND ANAEROBIC Blood Culture adequate volume Performed at [facility], [address], [place], [place] [address]     Culture   Final    NO GROWTH 1 DAY Performed at [facility], [address] [place], [place] [address]    Report Status PENDING  Incomplete    Radiology Studies: No results found.   Mardelle Pandolfi T. Khayri Kargbo [facility]  If 7PM-7AM, please contact night-coverage [email or web address] Password [facility] 10/28/2019, 2:41 PM

## 2019-10-28 NOTE — Discharge Instructions (Signed)
Diverticulitis  Diverticulitis is infection or inflammation of small pouches (diverticula) in the colon that form due to a condition ca  Diverticulitis  Diverticulitis is when small pockets in your large intestine (colon) get infected or swollen. This causes stomach pain and watery poop (diarrhea). These pouches are called diverticula. They form in people who have a condition called diverticulosis. Follow these instructions at home: Medicines  Take over-the-counter and prescription medicines only as told by your doctor. These include: ? Antibiotics. ? Pain medicines. ? Fiber pills. ? Probiotics. ? Stool softeners.  Do not drive or use heavy machinery while taking prescription pain medicine.  If you were prescribed an antibiotic, take it as told. Do not stop taking it even if you feel better. General instructions   Follow a diet as told by your doctor.  When you feel better, your doctor may tell you to change your diet. You may need to eat a lot of fiber. Fiber makes it easier to poop (have bowel movements). Healthy foods with fiber include: ? Berries. ? Beans. ? Lentils. ? Green vegetables.  Exercise 3 or more times a week. Aim for 30 minutes each time. Exercise enough to sweat and make your heart beat faster.  Keep all follow-up visits as told. This is important. You may need to have an exam of the large intestine. This is called a colonoscopy. Contact a doctor if:  Your pain does not get better.  You have a hard time eating or drinking.  You are not pooping like normal. Get help right away if:  Your pain gets worse.  Your problems do not get better.  Your problems get worse very fast.  You have a fever.  You throw up (vomit) more than one time.  You have poop that is: ? Bloody. ? Black. ? Tarry. Summary  Diverticulitis is when small pockets in your large intestine (colon) get infected or swollen.  Take medicines only as told by your doctor.  Follow a  diet as told by your doctor. This information is not intended to replace advice given to you by your health care provider. Make sure you discuss any questions you have with your health care provider. Document Revised: 04/10/2017 Document Reviewed: 05/15/2016 Elsevier Patient Education  [place]. lled diverticulosis. Diverticula can trap stool (feces) and bacteria, causing infection and inflammation. Diverticulitis may cause severe stomach pain and diarrhea. It may lead to tissue damage in the colon that causes bleeding. The diverticula may also burst (rupture) and cause infected stool to enter other areas of the abdomen. Complications of diverticulitis can include:  Bleeding.  Severe infection.  Severe pain.  Rupture (perforation) of the colon.  Blockage (obstruction) of the colon. What are the causes? This condition is caused by stool becoming trapped in the diverticula, which allows bacteria to grow in the diverticula. This leads to inflammation and infection. What increases the risk? You are more likely to develop this condition if:  You have diverticulosis. The risk for diverticulosis increases if: ? You are overweight or obese. ? You use tobacco products. ? You do not get enough exercise.  You eat a diet that does not include enough fiber. High-fiber foods include fruits, vegetables, beans, nuts, and whole grains. What are the signs or symptoms? Symptoms of this condition may include:  Pain and tenderness in the abdomen. The pain is normally located on the left side of the abdomen, but it may occur in other areas.  Fever and chills.  Bloating.  Cramping.  Nausea.  Vomiting.  Changes in bowel routines.  Blood in your stool. How is this diagnosed? This condition is diagnosed based on:  Your medical history.  A physical exam.  Tests to make sure there is nothing else causing your condition. These tests may include: ? Blood tests. ? Urine  tests. ? Imaging tests of the abdomen, including X-rays, ultrasounds, MRIs, or CT scans. How is this treated? Most cases of this condition are mild and can be treated at home. Treatment may include:  Taking over-the-counter pain medicines.  Following a clear liquid diet.  Taking antibiotic medicines by mouth.  Rest. More severe cases may need to be treated at a hospital. Treatment may include:  Not eating or drinking.  Taking prescription pain medicine.  Receiving antibiotic medicines through an IV tube.  Receiving fluids and nutrition through an IV tube.  Surgery. When your condition is under control, your health care provider may recommend that you have a colonoscopy. This is an exam to look at the entire large intestine. During the exam, a lubricated, bendable tube is inserted into the anus and then passed into the rectum, colon, and other parts of the large intestine. A colonoscopy can show how severe your diverticula are and whether something else may be causing your symptoms. Follow these instructions at home: Medicines  Take over-the-counter and prescription medicines only as told by your health care provider. These include fiber supplements, probiotics, and stool softeners.  If you were prescribed an antibiotic medicine, take it as told by your health care provider. Do not stop taking the antibiotic even if you start to feel better.  Do not drive or use heavy machinery while taking prescription pain medicine. General instructions   Follow a full liquid diet or another diet as directed by your health care provider. After your symptoms improve, your health care provider may tell you to change your diet. He or she may recommend that you eat a diet that contains at least 25 g (25 grams) of fiber daily. Fiber makes it easier to pass stool. Healthy sources of fiber include: ? Berries. One cup contains 4-8 grams of fiber. ? Beans or lentils. One half cup contains 5-8 grams of  fiber. ? Green vegetables. One cup contains 4 grams of fiber.  Exercise for at least 30 minutes, 3 times each week. You should exercise hard enough to raise your heart rate and break a sweat.  Keep all follow-up visits as told by your health care provider. This is important. You may need a colonoscopy. Contact a health care provider if:  Your pain does not improve.  You have a hard time drinking or eating food.  Your bowel movements do not return to normal. Get help right away if:  Your pain gets worse.  Your symptoms do not get better with treatment.  Your symptoms suddenly get worse.  You have a fever.  You vomit more than one time.  You have stools that are bloody, black, or tarry. Summary  Diverticulitis is infection or inflammation of small pouches (diverticula) in the colon that form due to a condition called diverticulosis. Diverticula can trap stool (feces) and bacteria, causing infection and inflammation.  You are at higher risk for this condition if you have diverticulosis and you eat a diet that does not include enough fiber.  Most cases of this condition are mild and can be treated at home. More severe cases may need to be treated at a hospital.  When  your condition is under control, your health care provider may recommend that you have an exam called a colonoscopy. This exam can show how severe your diverticula are and whether something else may be causing your symptoms. This information is not intended to replace advice given to you by your health care provider. Make sure you discuss any questions you have with your health care provider. Document Revised: 04/10/2017 Document Reviewed: 05/31/2016 Elsevier Patient Education  2020 [employer].

## 2019-10-29 LAB — PTH, INTACT AND CALCIUM
Calcium, Total (PTH): 8.9 mg/dL (ref 8.7–10.3)
PTH: 16 pg/mL (ref 15–65)

## 2019-10-29 MED ORDER — AMOXICILLIN-POT CLAVULANATE 875-125 MG PO TABS
1.0000 | ORAL_TABLET | Freq: Two times a day (BID) | ORAL | 0 refills | Status: AC
Start: 2019-10-29 — End: 2019-11-08

## 2019-10-29 NOTE — Discharge Summary (Signed)
Physician Discharge Summary  Dawn Haley ONG:[id number] DOB: [birth year] DOA: 10/26/2019  PCP: Hulan Fess, MD  Admit date: 10/26/2019 Discharge date: 10/29/2019  Admitted From: Home Disposition: Home  Recommendations for Outpatient Follow-up:  1. Follow ups as below. 2. Please obtain CBC/BMP/Mag at follow up 3. Please follow up on the following pending results: None  Home Health: None Equipment/Devices: None  Discharge Condition: Stable CODE STATUS: Full code   Follow-up Information    Little, [name], MD. Schedule an appointment as soon as possible for a visit in 1 week(s).   Specialty: Family Medicine Contact information: [place] [address] [address] Hospital Course: [age]-year-old female with history of diverticulosis/diverticulitis and enterovaginal fistula presenting with progressive LLQ and left flank pain for about 4 days.  She had a rectal contrast CT study as preop work-up for enterovaginal fistula scheduled for June 30 with Dr. Leighton Ruff of general surgery.  CT revealed mild sigmoid diverticulitis without complication.  She was started on Cipro and Flagyl without improvement in the pain which prompted her to come to ED.  In the ED, stable vital signs.  Basic labs including CBC and BMP without significant finding other than mildly elevated creatinine.  CT abdomen and pelvis revealed diffuse sigmoid diverticulitis without abscess or perforation.  Case discussed with Dr. Marcello Moores of surgery who recommended treatment with IV antibiotics.  She was started on IV Zosyn and admitted.  On the day of discharge, blood cultures negative.  She tolerated soft diet for dinner, breakfast and lunch and felt well to go home and complete antibiotic course.  She already has upcoming appointment with surgery for robotic colectomy on 11/09/2019.  Discharged with Augmentin for 10 more days.  Also recommended taking probiotics while on  Augmentin.  See individual problem list below for more hospital course.  Discharge Diagnoses:  Acute sigmoid diverticulitis-failed outpatient therapy with oral antibiotics.  No abscess or signs of perforation on CT abdomen and pelvis. -IV Zosyn 6/17-6/19.  Discharged on Augmentin for 10 more days -Follow-up with general surgery as previously scheduled  Enteroaginal fistula-followed by Dr. Marcello Moores, general surgery outpatient.  Not able to localize the enteric opening of fistula on imaging yet. -Outpatient follow-up with general surgery as previously scheduled  Mild hypocalcemia: Received 1 g of calcium gluconate IV.  Vitamin D 28.  PTH pending. -Already on calcium and vitamin D supplementation at home.  Insomnia   Body mass index is 31.64 kg/m.            Discharge Exam: Vitals:   10/28/19 2105 10/29/19 0615  BP: 136/69 127/69  Pulse: 62 (!) 58  Resp: 17 18  Temp: 99.1 F (37.3 C) 97.9 F (36.6 C)  SpO2: 100% 96%    GENERAL: No apparent distress.  Nontoxic. HEENT: MMM.  Vision and hearing grossly intact.  NECK: Supple.  No apparent JVD.  RESP:  No IWOB.  Fair aeration bilaterally. CVS:  RRR. Heart sounds normal.  ABD/GI/GU: Bowel sounds present. Soft.  Mild tenderness over LLQ.  No rebound. MSK/EXT:  Moves extremities. No apparent deformity. No edema.  SKIN: no apparent skin lesion or wound NEURO: Awake, alert and oriented appropriately.  No apparent focal neuro deficit. PSYCH: Calm. Normal affect.   Discharge Instructions  Discharge Instructions    Call MD for:  extreme fatigue   Complete by: As directed    Call MD for:  persistant dizziness  or light-headedness   Complete by: As directed    Call MD for:  persistant nausea and vomiting   Complete by: As directed    Call MD for:  severe uncontrolled pain   Complete by: As directed    Call MD for:  temperature >100.4   Complete by: As directed    Diet - low sodium heart healthy   Complete by: As  directed    Discharge instructions   Complete by: As directed    It has been a pleasure taking care of you!  You were hospitalized and treated for acute diverticulitis.  Your symptoms improved to the point we think it is safe to let you go home and follow-up with your doctors.  We are discharging you on Augmentin for 10 more days.  It is very important that you complete the whole course regardless of improvement in the symptoms.  Continue your probiotics while on Augmentin.   We may have started you on other new medications or made some changes to your home medications during this hospitalization. Please review your new medication list and the directions carefully before you take them.    Please go to your hospital follow-up appointments or call to reschedule as recommended.   Take care,   Increase activity slowly   Complete by: As directed      Allergies as of 10/29/2019      Reactions   Codeine Hives, Rash, Other (See Comments)   Intensifies pain instead of relieving   Other Other (See Comments)   Certain cholesterol medications give her cramps, she can't remember which ones   Pentothal [thiopental] Rash      Medication List    STOP taking these medications   ciprofloxacin 500 MG tablet Commonly known as: CIPRO   ibuprofen 200 MG tablet Commonly known as: ADVIL   metroNIDAZOLE 500 MG tablet Commonly known as: FLAGYL     TAKE these medications   amoxicillin-clavulanate 875-125 MG tablet Commonly known as: Augmentin Take 1 tablet by mouth 2 (two) times daily for 10 days.   ASHWAGANDHA PO Take 800 mg by mouth in the morning and at bedtime.   CHELATED MAGNESIUM PO Take 200 mg by mouth daily.   Fish Oil 1200 MG Caps Take 1,200 mg by mouth daily.   fluticasone 50 MCG/ACT nasal spray Commonly known as: FLONASE Place 1 spray into both nostrils daily as needed for allergies.   GARLIC PO Take 1 capsule by mouth daily.   loratadine-pseudoephedrine 5-120 MG  tablet Commonly known as: CLARITIN-D 12-hour Take 1 tablet by mouth daily.   LORazepam 0.5 MG tablet Commonly known as: ATIVAN Take 0.5 mg by mouth at bedtime as needed for sleep.   multivitamin with minerals Tabs tablet Take 1 tablet by mouth daily.   OVER THE COUNTER MEDICATION Take 2 capsules by mouth daily. Adrenal Support Blend   PROBIOTIC PO Take 1 capsule by mouth daily.   Ubiquinol 100 MG Caps Take 100 mg by mouth daily.   Vitamin D 125 MCG (5000 UT) Caps Take 5,000 Units by mouth daily.       Consultations:  General surgery by EDP on admission  Procedures/Studies:   CT PELVIS WO CONTRAST  Result Date: 10/20/2019 CLINICAL DATA:  Preop for colectomy. Diverticulitis. Evaluate for fistula. EXAM: CT PELVIS WITHOUT CONTRAST TECHNIQUE: Multidetector CT imaging of the pelvis was performed following the standard protocol without intravenous contrast. COMPARISON:  04/21/2019 FINDINGS: Urinary Tract: No distal hydroureter. Normal noncontrast appearance of  the urinary bladder. No significant wall thickening or intraluminal gas to suggest fistula. No rectal contrast within the bladder. Bowel: Rectal contrast to the level of the transverse colon. Extensive colonic diverticulosis. Wall thickening involves the sigmoid, likely due to muscular hypertrophy. Suspect minimal interstitial thickening within the sigmoid mesocolon including on 28/2. No fluid collection or free perforation. Normal terminal ileum. Appendectomy. Normal small bowel. Vascular/Lymphatic: Aortic atherosclerosis. No pelvic sidewall adenopathy. Reproductive:  Hysterectomy.  No air or contrast within the vagina. Other:  No significant free fluid. Musculoskeletal: No acute osseous abnormality. IMPRESSION: 1. Extensive colonic diverticulosis. Suspicion of mild sigmoid diverticulitis. No findings of fistulous communication. 2.  Aortic Atherosclerosis (ICD10-I70.0). Electronically Signed   By: Jeronimo Greaves M.D.   On: 10/20/2019  09:56   CT ABDOMEN PELVIS W CONTRAST  Result Date: 10/26/2019 CLINICAL DATA:  Diverticulitis on antibiotics but with worsening symptoms, rule out progression, abscess or fistula EXAM: CT ABDOMEN AND PELVIS WITH CONTRAST TECHNIQUE: Multidetector CT imaging of the abdomen and pelvis was performed using the standard protocol following bolus administration of intravenous contrast. CONTRAST:  OMNIPAQUE IOHEXOL 300 MG/ML  SOLN COMPARISON:  CT abdomen pelvis 04/21/2019, CT pelvis 10/20/2019 FINDINGS: Lower chest: Bandlike areas of opacity in the lung bases favoring some subsegmental atelectatic change and/or scarring. Otherwise clear lung bases. Normal heart size. No pericardial effusion. Hepatobiliary: Diffuse hepatic hypoattenuation compatible with hepatic steatosis. Sparing seen along the gallbladder fossa. No concerning focal liver lesions. Smooth liver surface contour. Normal gallbladder. No pericholecystic fluid or inflammation. No calcified gallstones or biliary ductal dilatation. Pancreas: Unremarkable. No pancreatic ductal dilatation or surrounding inflammatory changes. Spleen: Normal in size without focal abnormality. Adrenals/Urinary Tract: Normal adrenal glands. Kidneys enhance uniformly and excrete symmetrically albeit with asymmetric scarring of the left kidney. No concerning focal renal lesions. No urolithiasis or hydronephrosis. There is some mild distal left ureterectasis and urothelial thickening, nonspecific though possibly reactive to the adjacent colonic process. Some slightly asymmetric thickening along the left posterolateral bladder is noted as well. Stomach/Bowel: Distal esophagus, stomach and duodenal sweep are unremarkable. No small bowel wall thickening or dilatation. No evidence of obstruction. The appendix is surgically absent. Redemonstration of the diffuse sigmoid diverticulosis with some slightly more focal segmental thickening and pericolonic stranding centered along the left  anterolateral aspect of the distal sigmoid (2/77). This inflammation is in the vicinity of the posterolateral bladder and distal left ureter albeit without discernible abscess, collection, or features to suggest fistulization though the latter is poorly assessed on this study in the absence of enteric contrast media. Vascular/Lymphatic: Atherosclerotic calcifications throughout the abdominal aorta and branch vessels. No aneurysm or ectasia. No enlarged abdominopelvic lymph nodes. Reproductive: Uterus is surgically absent. No concerning adnexal lesions. Other: Small fat containing umbilical hernia. Mild posterior body wall edema. No abdominopelvic free air, fluid, abscess or collection. Small amount of inflammation centered upon the sigmoid, as above. Musculoskeletal: No acute osseous abnormality or suspicious osseous lesion. Multilevel degenerative changes are present in the imaged portions of the spine. IMPRESSION: 1. Redemonstration of the diffuse sigmoid diverticulosis with some mild inflammatory residua in thickening of the distal sigmoid compatible with patient's known diverticulitis. Overall, degree of inflammation is similar to the comparison study. This inflammation is in the vicinity of the posterolateral bladder and distal left ureter with some reactive changes urothelial thickening and bladder wall thickening, albeit without discernible abscess, collection, or features to suggest fistulization though the latter is poorly assessed on this study in the absence of enteric contrast media.  2. Asymmetric scarring of the left kidney, similar to priors. 3. Hepatic steatosis. 4. Aortic Atherosclerosis (ICD10-I70.0). Electronically Signed   By: Kreg Shropshire M.D.   On: 10/26/2019 19:07        The results of significant diagnostics from this hospitalization (including imaging, microbiology, ancillary and laboratory) are listed below for reference.     Microbiology: Recent Results (from the past 240  hour(s))  Blood culture (routine x 2)     Status: None (Preliminary result)   Collection Time: 10/26/19  9:18 PM   Specimen: BLOOD  Result Value Ref Range Status   Specimen Description   Final    BLOOD BLOOD RIGHT HAND Performed at [facility], [address] [place], [place] [address]    Special Requests   Final    BOTTLES DRAWN AEROBIC AND ANAEROBIC Blood Culture adequate volume Performed at [facility], [address] [place], [place] [address]    Culture   Final    NO GROWTH 2 DAYS Performed at [facility], [address] [place], [place] [address]    Report Status PENDING  Incomplete  SARS Coronavirus 2 by RT PCR (hospital order, performed in [facility] hospital lab) Nasopharyngeal Nasopharyngeal Swab     Status: None   Collection Time: 10/26/19  9:18 PM   Specimen: Nasopharyngeal Swab  Result Value Ref Range Status   SARS Coronavirus 2 NEGATIVE NEGATIVE Final    Comment: (NOTE) SARS-CoV-2 target nucleic acids are NOT DETECTED.  The SARS-CoV-2 RNA is generally detectable in upper and lower respiratory specimens during the acute phase of infection. The lowest concentration of SARS-CoV-2 viral copies this assay can detect is 250 copies / mL. A negative result does not preclude SARS-CoV-2 infection and should not be used as the sole basis for treatment or other patient management decisions.  A negative result may occur with improper specimen collection / handling, submission of specimen other than nasopharyngeal swab, presence of viral mutation(s) within the areas targeted by this assay, and inadequate number of viral copies (<250 copies / mL). A negative result must be combined with clinical observations, patient history, and epidemiological information.  Fact Sheet for Patients:   BoilerBrush.com.cy  Fact Sheet for Healthcare Providers: https://pope.com/  This test is not yet approved or   cleared by the [place] FDA and has been authorized for detection and/or diagnosis of SARS-CoV-2 by FDA under an Emergency Use Authorization (EUA).  This EUA will remain in effect (meaning this test can be used) for the duration of the COVID-19 declaration under Section 564(b)(1) of the Act, 21 U.S.C. section 360bbb-3(b)(1), unless the authorization is terminated or revoked sooner.  Performed at [facility], [address] [place], [place] [address]   Blood culture (routine x 2)     Status: None (Preliminary result)   Collection Time: 10/26/19  9:35 PM   Specimen: BLOOD LEFT ARM  Result Value Ref Range Status   Specimen Description   Final    BLOOD LEFT ARM Performed at [facility], [address] [place], [place] [address]    Special Requests   Final    BOTTLES DRAWN AEROBIC AND ANAEROBIC Blood Culture adequate volume Performed at [facility], [address] [place], [place] [address]    Culture   Final    NO GROWTH 2 DAYS Performed at [facility], [address] [place], [place] [address]  Report Status PENDING  Incomplete     Labs: BNP (last 3 results) No results for input(s): BNP in the last 8760 hours. Basic Metabolic Panel: Recent Labs  Lab 10/26/19 1737 10/27/19 1051 10/28/19 0310 10/28/19 1652  NA 136 138 139  --   K 3.6 3.6 4.0  --   CL 102 104 109  --   CO2 25 26 23   --   GLUCOSE 94 127* 108*  --   BUN 13 7* <5*  --   CREATININE 1.06* 0.93 0.95  --   CALCIUM 8.5* 8.7* 8.6* 8.9  MG  --  1.9  --   --    Liver Function Tests: Recent Labs  Lab 10/26/19 1737  AST 23  ALT 30  ALKPHOS 66  BILITOT 0.4  PROT 7.3  ALBUMIN 4.2   Recent Labs  Lab 10/26/19 1737  LIPASE 30   No results for input(s): AMMONIA in the last 168 hours. CBC: Recent Labs  Lab 10/26/19 1737 10/28/19 0310  WBC 6.6 4.5  HGB 13.8 13.0  HCT 42.0 39.6  MCV 90.5 90.0  PLT 254 225   Cardiac Enzymes: No results for  input(s): CKTOTAL, CKMB, CKMBINDEX, TROPONINI in the last 168 hours. BNP: Invalid input(s): POCBNP CBG: No results for input(s): GLUCAP in the last 168 hours. D-Dimer No results for input(s): DDIMER in the last 72 hours. Hgb A1c No results for input(s): HGBA1C in the last 72 hours. Lipid Profile No results for input(s): CHOL, HDL, LDLCALC, TRIG, CHOLHDL, LDLDIRECT in the last 72 hours. Thyroid function studies No results for input(s): TSH, T4TOTAL, T3FREE, THYROIDAB in the last 72 hours.  Invalid input(s): FREET3 Anemia work up No results for input(s): VITAMINB12, FOLATE, FERRITIN, TIBC, IRON, RETICCTPCT in the last 72 hours. Urinalysis    Component Value Date/Time   COLORURINE YELLOW 10/26/2019 1737   APPEARANCEUR CLEAR 10/26/2019 1737   LABSPEC 1.010 10/26/2019 1737   PHURINE 6.0 10/26/2019 1737   GLUCOSEU NEGATIVE 10/26/2019 1737   HGBUR TRACE (A) 10/26/2019 1737   BILIRUBINUR NEGATIVE 10/26/2019 1737   KETONESUR NEGATIVE 10/26/2019 1737   PROTEINUR NEGATIVE 10/26/2019 1737   NITRITE NEGATIVE 10/26/2019 1737   LEUKOCYTESUR MODERATE (A) 10/26/2019 1737   Sepsis Labs Invalid input(s): PROCALCITONIN,  WBC,  LACTICIDVEN   Time coordinating discharge: 25 minutes  SIGNED:  10/28/2019, MD  [facility] 10/29/2019, 3:48 PM  If 7PM-7AM, please contact night-coverage [email or web address] Password [email or web address]

## 2019-10-29 NOTE — Progress Notes (Signed)
Dawn Haley to be D/C'd  per MD order. Discussed with the patient and all questions fully answered.  VSS, Skin clean, dry and intact without evidence of skin break down, no evidence of skin tears noted.  IV catheter discontinued intact. Site without signs and symptoms of complications. Dressing and pressure applied.  An After Visit Summary was printed and given to the patient. Patient received prescription.  D/c education completed with patient/family including follow up instructions, medication list, d/c activities limitations if indicated, with other d/c instructions as indicated by MD - patient able to verbalize understanding, all questions fully answered.   Patient instructed to return to ED, call 911, or call MD for any changes in condition.   Patient to be escorted via WC, and D/C home via private auto.

## 2019-10-29 NOTE — Plan of Care (Signed)

## 2019-10-30 LAB — CALCIUM, IONIZED: Calcium, Ionized, Serum: 4.9 mg/dL (ref 4.5–5.6)

## 2019-11-01 DIAGNOSIS — R519 Headache, unspecified: Secondary | ICD-10-CM

## 2019-11-01 HISTORY — DX: Headache, unspecified: R51.9

## 2019-11-01 LAB — CULTURE, BLOOD (ROUTINE X 2)
Culture: NO GROWTH
Culture: NO GROWTH
Special Requests: ADEQUATE
Special Requests: ADEQUATE

## 2019-11-01 NOTE — Patient Instructions (Addendum)
DUE TO COVID-19 ONLY ONE VISITOR IS ALLOWED TO COME WITH YOU AND STAY IN THE WAITING ROOM ONLY DURING PRE OP AND PROCEDURE DAY OF SURGERY. THE 2 VISITORS  MAY VISIT WITH YOU AFTER SURGERY IN YOUR PRIVATE ROOM DURING VISITING HOURS ONLY!  YOU NEED TO HAVE A COVID 19 TEST ON_Sat 6/26______ @_9 :15______, THIS TEST MUST BE DONE BEFORE SURGERY, COME  [address], [place] , .  [facility]) ONCE YOUR COVID TEST IS COMPLETED, PLEASE BEGIN THE QUARANTINE INSTRUCTIONS AS OUTLINED IN YOUR HANDOUT.                NORETTA FRIER   Your procedure is scheduled on: 11/09/19   Report to [facility] Main  Entrance   Report to admitting at  11:15 AM     Call this number if you have problems the morning of surgery [phone]    Drink plenty of fluid on the day of your prep to prevent dehydration   DRINK 2 PRESURGERY ENSURE DRINKS THE NIGHT BEFORE SURGERY AT 10:00 PM    NO SOLIDS AFTER MIDNIGHT THE DAY PRIOR TO THE SURGERY.  . NOTHING BY MOUTH EXCEPT CLEAR LIQUIDS UNTIL 10:00 am.   PLEASE FINISH PRESURGERY ENSURE DRINK PER SURGEON COMPLETED AT _10:00 am    BRUSH YOUR TEETH MORNING OF SURGERY AND RINSE YOUR MOUTH OUT, NO CHEWING GUM CANDY OR MINTS.      Take these medicines the morning of surgery with A SIP OF WATER: None                                You may not have any metal on your body including hair pins and              piercings  Do not wear jewelry, make-up, lotions, powders or perfumes, deodorant             Do not wear nail polish on your fingernails.             Do not shave  48 hours prior to surgery.        Do not bring valuables to the hospital. [facility] IS NOT             RESPONSIBLE   FOR VALUABLES.  Contacts, dentures or bridgework may not be worn into surgery. .                 Please read over the following fact sheets you were given: _____________________________________________________________________   [facility] -  Preparing for Surgery  Before surgery, you can play an important role.   Because skin is not sterile, your skin needs to be as free of germs as possible.   You can reduce the number of germs on your skin by washing with CHG (chlorahexidine gluconate) soap before surgery.   CHG is an antiseptic cleaner which kills germs and bonds with the skin to continue killing germs even after washing. Please DO NOT use if you have an allergy to CHG or antibacterial soaps.   If your skin becomes reddened/irritated stop using the CHG and inform your nurse when you arrive at Short Stay. Do not shave (including legs and underarms) for at least 48 hours prior to the first CHG shower  Please follow these instructions carefully:  1.  Shower with CHG Soap the night before surgery and the  morning of Surgery.  2.  If you choose to wash your hair, wash your hair first as usual with your  normal  shampoo.  3.  After you shampoo, rinse your hair and body thoroughly to remove the  shampoo.                                        4.  Use CHG as you would any other liquid soap.  You can apply chg directly  to the skin and wash                       Gently with a scrungie or clean washcloth.  5.  Apply the CHG Soap to your body ONLY FROM THE NECK DOWN.   Do not use on face/ open                           Wound or open sores. Avoid contact with eyes, ears mouth and genitals (private parts).                       Wash face,  Genitals (private parts) with your normal soap.             6.  Wash thoroughly, paying special attention to the area where your surgery  will be performed.  7.  Thoroughly rinse your body with warm water from the neck down.  8.  DO NOT shower/wash with your normal soap after using and rinsing off  the CHG Soap.             9.  Pat yourself dry with a clean towel.            10.  Wear clean pajamas.            11.  Place clean sheets on your bed the night of your first shower and do not  sleep with  pets. Day of Surgery : Do not apply any lotions/deodorants the morning of surgery.  Please wear clean clothes to the hospital/surgery center.  FAILURE TO FOLLOW THESE INSTRUCTIONS MAY RESULT IN THE CANCELLATION OF YOUR SURGERY PATIENT SIGNATURE_________________________________  NURSE SIGNATURE__________________________________  ________________________________________________________________________   Adam Phenix  An incentive spirometer is a tool that can help keep your lungs clear and active. This tool measures how well you are filling your lungs with each breath. Taking long deep breaths may help reverse or decrease the chance of developing breathing (pulmonary) problems (especially infection) following:  A long period of time when you are unable to move or be active. BEFORE THE PROCEDURE   If the spirometer includes an indicator to show your best effort, your nurse or respiratory therapist will set it to a desired goal.  If possible, sit up straight or lean slightly forward. Try not to slouch.  Hold the incentive spirometer in an upright position. INSTRUCTIONS FOR USE  1. Sit on the edge of your bed if possible, or sit up as far as you can in bed or on a chair. 2. Hold the incentive spirometer in an upright position. 3. Breathe out normally. 4. Place the mouthpiece in your mouth and seal your lips tightly around it. 5. Breathe in slowly and as deeply as possible, raising the piston or the ball toward the top of the column. 6. Hold your breath for 3-5 seconds  or for as long as possible. Allow the piston or ball to fall to the bottom of the column. 7. Remove the mouthpiece from your mouth and breathe out normally. 8. Rest for a few seconds and repeat Steps 1 through 7 at least 10 times every 1-2 hours when you are awake. Take your time and take a few normal breaths between deep breaths. 9. The spirometer may include an indicator to show your best effort. Use the indicator  as a goal to work toward during each repetition. 10. After each set of 10 deep breaths, practice coughing to be sure your lungs are clear. If you have an incision (the cut made at the time of surgery), support your incision when coughing by placing a pillow or rolled up towels firmly against it. Once you are able to get out of bed, walk around indoors and cough well. You may stop using the incentive spirometer when instructed by your caregiver.  RISKS AND COMPLICATIONS  Take your time so you do not get dizzy or light-headed.  If you are in pain, you may need to take or ask for pain medication before doing incentive spirometry. It is harder to take a deep breath if you are having pain. AFTER USE  Rest and breathe slowly and easily.  It can be helpful to keep track of a log of your progress. Your caregiver can provide you with a simple table to help with this. If you are using the spirometer at home, follow these instructions: SEEK MEDICAL CARE IF:   You are having difficultly using the spirometer.  You have trouble using the spirometer as often as instructed.  Your pain medication is not giving enough relief while using the spirometer.  You develop fever of 100.5 F (38.1 C) or higher. SEEK IMMEDIATE MEDICAL CARE IF:   You cough up bloody sputum that had not been present before.  You develop fever of 102 F (38.9 C) or greater.  You develop worsening pain at or near the incision site. MAKE SURE YOU:   Understand these instructions.  Will watch your condition.  Will get help right away if you are not doing well or get worse. Document Released: 09/08/2006 Document Revised: 07/21/2011 Document Reviewed: 11/09/2006 Exit[employer] Patient Information 2014 ExitCare, [place].   ________________________________________________________________________  WHAT IS A BLOOD TRANSFUSION? Blood Transfusion Information  A transfusion is the replacement of blood or some of its parts. Blood is made  up of multiple cells which provide different functions.  Red blood cells carry oxygen and are used for blood loss replacement.  White blood cells fight against infection.  Platelets control bleeding.  Plasma helps clot blood.  Other blood products are available for specialized needs, such as hemophilia or other clotting disorders. BEFORE THE TRANSFUSION  Who gives blood for transfusions?   Healthy volunteers who are fully evaluated to make sure their blood is safe. This is blood bank blood. Transfusion therapy is the safest it has ever been in the practice of medicine. Before blood is taken from a donor, a complete history is taken to make sure that person has no history of diseases nor engages in risky social behavior (examples are intravenous drug use or sexual activity with multiple partners). The donor's travel history is screened to minimize risk of transmitting infections, such as malaria. The donated blood is tested for signs of infectious diseases, such as HIV and hepatitis. The blood is then tested to be sure it is compatible with you in order to  minimize the chance of a transfusion reaction. If you or a relative donates blood, this is often done in anticipation of surgery and is not appropriate for emergency situations. It takes many days to process the donated blood. RISKS AND COMPLICATIONS Although transfusion therapy is very safe and saves many lives, the main dangers of transfusion include:   Getting an infectious disease.  Developing a transfusion reaction. This is an allergic reaction to something in the blood you were given. Every precaution is taken to prevent this. The decision to have a blood transfusion has been considered carefully by your caregiver before blood is given. Blood is not given unless the benefits outweigh the risks. AFTER THE TRANSFUSION  Right after receiving a blood transfusion, you will usually feel much better and more energetic. This is especially  true if your red blood cells have gotten low (anemic). The transfusion raises the level of the red blood cells which carry oxygen, and this usually causes an energy increase.  The nurse administering the transfusion will monitor you carefully for complications. HOME CARE INSTRUCTIONS  No special instructions are needed after a transfusion. You may find your energy is better. Speak with your caregiver about any limitations on activity for underlying diseases you may have. SEEK MEDICAL CARE IF:   Your condition is not improving after your transfusion.  You develop redness or irritation at the intravenous (IV) site. SEEK IMMEDIATE MEDICAL CARE IF:  Any of the following symptoms occur over the next 12 hours:  Shaking chills.  You have a temperature by mouth above 102 F (38.9 C), not controlled by medicine.  Chest, back, or muscle pain.  People around you feel you are not acting correctly or are confused.  Shortness of breath or difficulty breathing.  Dizziness and fainting.  You get a rash or develop hives.  You have a decrease in urine output.  Your urine turns a dark color or changes to pink, red, or brown. Any of the following symptoms occur over the next 10 days:  You have a temperature by mouth above 102 F (38.9 C), not controlled by medicine.  Shortness of breath.  Weakness after normal activity.  The white part of the eye turns yellow (jaundice).  You have a decrease in the amount of urine or are urinating less often.  Your urine turns a dark color or changes to pink, red, or brown. Document Released: 04/25/2000 Document Revised: 07/21/2011 Document Reviewed: 12/13/2007 [facility] Patient Information 2014 [place], [place].  _______________________________________________________________________

## 2019-11-03 ENCOUNTER — Encounter (HOSPITAL_COMMUNITY)
Admission: RE | Admit: 2019-11-03 | Discharge: 2019-11-03 | Disposition: A | Discharge status: Home / Self Care | Payer: Managed Care, Other (non HMO) | Source: Ambulatory Visit | Attending: General Surgery | Admitting: General Surgery

## 2019-11-03 ENCOUNTER — Other Ambulatory Visit: Payer: Self-pay

## 2019-11-03 ENCOUNTER — Encounter (HOSPITAL_COMMUNITY): Payer: Self-pay

## 2019-11-03 ENCOUNTER — Encounter (HOSPITAL_COMMUNITY): Admission: RE | Admit: 2019-11-03 | Payer: Managed Care, Other (non HMO) | Source: Ambulatory Visit

## 2019-11-03 DIAGNOSIS — Z01812 Encounter for preprocedural laboratory examination: Secondary | ICD-10-CM | POA: Diagnosis present

## 2019-11-03 HISTORY — DX: Dyspnea, unspecified: R06.00

## 2019-11-03 NOTE — Progress Notes (Signed)
COVID Vaccine Completed: one shot done waiting for the second Date COVID Vaccine completed:first shot 08/2019 COVID vaccine manufacturer: Pfizer     PCP - Dr. Wyline Beady Cardiologist - no  Chest x-ray - no EKG - no Stress Test - no ECHO - no Cardiac Cath - no  Sleep Study - NA CPAP -   Fasting Blood Sugar - NA Checks Blood Sugar _____ times a day  Blood Thinner Instructions:NA Aspirin Instructions: Last Dose:  Anesthesia review:   Patient denies shortness of breath, fever, cough and chest pain at PAT appointment Yes   Patient verbalized understanding of instructions that were given to them at the PAT appointment. Patient was also instructed that they will need to review over the PAT instructions again at home before surgery. Yes  Pt reports that she has been walking regularly but gets "winded" when going up hill.

## 2019-11-05 ENCOUNTER — Other Ambulatory Visit (HOSPITAL_COMMUNITY)
Admission: RE | Admit: 2019-11-05 | Discharge: 2019-11-05 | Disposition: A | Discharge status: Home / Self Care | Payer: Managed Care, Other (non HMO) | Source: Ambulatory Visit | Attending: General Surgery | Admitting: General Surgery

## 2019-11-05 DIAGNOSIS — Z01812 Encounter for preprocedural laboratory examination: Secondary | ICD-10-CM | POA: Diagnosis present

## 2019-11-05 DIAGNOSIS — Z20822 Contact with and (suspected) exposure to covid-19: Secondary | ICD-10-CM | POA: Diagnosis not present

## 2019-11-05 LAB — SARS CORONAVIRUS 2 (TAT 6-24 HRS): SARS Coronavirus 2: NEGATIVE

## 2019-11-08 MED ORDER — BUPIVACAINE LIPOSOME 1.3 % IJ SUSP
20.0000 mL | Freq: Once | INTRAMUSCULAR | Status: DC
  Filled 2019-11-08: qty 20

## 2019-11-08 NOTE — Anesthesia Preprocedure Evaluation (Addendum)
Anesthesia Evaluation  Patient identified by MRN, date of birth, ID band Patient awake    Reviewed: Allergy & Precautions, NPO status , Patient's Chart, lab work & pertinent test results  Airway Mallampati: II  TM Distance: >3 FB Neck ROM: Full    Dental no notable dental hx.    Pulmonary neg pulmonary ROS,    Pulmonary exam normal breath sounds clear to auscultation       Cardiovascular negative cardio ROS Normal cardiovascular exam Rhythm:Regular Rate:Normal     Neuro/Psych  Headaches, negative neurological ROS  negative psych ROS   GI/Hepatic Neg liver ROS, GERD  ,  Endo/Other  negative endocrine ROS  Renal/GU negative Renal ROS  negative genitourinary   Musculoskeletal negative musculoskeletal ROS (+)   Abdominal (+) + obese,   Peds negative pediatric ROS (+)  Hematology negative hematology ROS (+)   Anesthesia Other Findings COLOVESICAL FISTULA  Reproductive/Obstetrics negative OB ROS                            Anesthesia Physical Anesthesia Plan  ASA: II  Anesthesia Plan: General   Post-op Pain Management:    Induction: Intravenous  PONV Risk Score and Plan: 4 or greater and Midazolam, Dexamethasone, Ondansetron and Treatment may vary due to age or medical condition  Airway Management Planned: Oral ETT  Additional Equipment:   Intra-op Plan:   Post-operative Plan: Extubation in OR  Informed Consent: I have reviewed the patients History and Physical, chart, labs and discussed the procedure including the risks, benefits and alternatives for the proposed anesthesia with the patient or authorized representative who has indicated his/her understanding and acceptance.     Dental advisory given  Plan Discussed with: CRNA and Surgeon  Anesthesia Plan Comments:        Anesthesia Quick Evaluation

## 2019-11-09 ENCOUNTER — Inpatient Hospital Stay (HOSPITAL_COMMUNITY): Payer: Managed Care, Other (non HMO) | Admitting: Anesthesiology

## 2019-11-09 ENCOUNTER — Encounter (HOSPITAL_COMMUNITY): Payer: Self-pay | Admitting: General Surgery

## 2019-11-09 ENCOUNTER — Encounter (HOSPITAL_COMMUNITY)
Admission: RE | Disposition: A | Discharge status: Home / Self Care | Payer: Self-pay | Source: Home / Self Care | Attending: General Surgery

## 2019-11-09 ENCOUNTER — Inpatient Hospital Stay (HOSPITAL_COMMUNITY)
Admission: RE | Admit: 2019-11-09 | Discharge: 2019-11-12 | DRG: 330 | Disposition: A | Discharge status: Home / Self Care | Payer: Managed Care, Other (non HMO) | Attending: General Surgery | Admitting: General Surgery

## 2019-11-09 DIAGNOSIS — Z79899 Other long term (current) drug therapy: Secondary | ICD-10-CM | POA: Diagnosis not present

## 2019-11-09 DIAGNOSIS — I951 Orthostatic hypotension: Secondary | ICD-10-CM | POA: Diagnosis not present

## 2019-11-09 DIAGNOSIS — Z888 Allergy status to other drugs, medicaments and biological substances status: Secondary | ICD-10-CM

## 2019-11-09 DIAGNOSIS — K219 Gastro-esophageal reflux disease without esophagitis: Secondary | ICD-10-CM | POA: Diagnosis present

## 2019-11-09 DIAGNOSIS — Z8249 Family history of ischemic heart disease and other diseases of the circulatory system: Secondary | ICD-10-CM | POA: Diagnosis not present

## 2019-11-09 DIAGNOSIS — Z820 Family history of epilepsy and other diseases of the nervous system: Secondary | ICD-10-CM

## 2019-11-09 DIAGNOSIS — Z20822 Contact with and (suspected) exposure to covid-19: Secondary | ICD-10-CM | POA: Diagnosis present

## 2019-11-09 DIAGNOSIS — Z90722 Acquired absence of ovaries, bilateral: Secondary | ICD-10-CM | POA: Diagnosis not present

## 2019-11-09 DIAGNOSIS — Z818 Family history of other mental and behavioral disorders: Secondary | ICD-10-CM | POA: Diagnosis not present

## 2019-11-09 DIAGNOSIS — Z8261 Family history of arthritis: Secondary | ICD-10-CM

## 2019-11-09 DIAGNOSIS — Z833 Family history of diabetes mellitus: Secondary | ICD-10-CM | POA: Diagnosis not present

## 2019-11-09 DIAGNOSIS — Z91048 Other nonmedicinal substance allergy status: Secondary | ICD-10-CM

## 2019-11-09 DIAGNOSIS — E7800 Pure hypercholesterolemia, unspecified: Secondary | ICD-10-CM | POA: Diagnosis present

## 2019-11-09 DIAGNOSIS — Z885 Allergy status to narcotic agent status: Secondary | ICD-10-CM | POA: Diagnosis not present

## 2019-11-09 DIAGNOSIS — N823 Fistula of vagina to large intestine: Secondary | ICD-10-CM | POA: Diagnosis present

## 2019-11-09 DIAGNOSIS — Z823 Family history of stroke: Secondary | ICD-10-CM

## 2019-11-09 DIAGNOSIS — Z811 Family history of alcohol abuse and dependence: Secondary | ICD-10-CM | POA: Diagnosis not present

## 2019-11-09 DIAGNOSIS — K5732 Diverticulitis of large intestine without perforation or abscess without bleeding: Secondary | ICD-10-CM | POA: Diagnosis present

## 2019-11-09 DIAGNOSIS — Z90710 Acquired absence of both cervix and uterus: Secondary | ICD-10-CM | POA: Diagnosis not present

## 2019-11-09 DIAGNOSIS — N824 Other female intestinal-genital tract fistulae: Secondary | ICD-10-CM | POA: Diagnosis present

## 2019-11-09 DIAGNOSIS — N736 Female pelvic peritoneal adhesions (postinfective): Secondary | ICD-10-CM | POA: Diagnosis present

## 2019-11-09 HISTORY — PX: CYSTOSCOPY: SHX5120

## 2019-11-09 LAB — SAMPLE TO BLOOD BANK

## 2019-11-09 SURGERY — COLECTOMY, PARTIAL, ROBOT-ASSISTED, LAPAROSCOPIC
Anesthesia: General | Site: Urethra

## 2019-11-09 MED ORDER — LACTATED RINGERS IV SOLN: INTRAVENOUS | Status: DC

## 2019-11-09 MED ORDER — ONDANSETRON HCL 4 MG/2ML IJ SOLN
INTRAMUSCULAR | Status: AC
  Filled 2019-11-09: qty 2

## 2019-11-09 MED ORDER — CHLORHEXIDINE GLUCONATE 0.12 % MT SOLN
15.0000 mL | Freq: Once | OROMUCOSAL | Status: AC
  Administered 2019-11-09: 15 mL via OROMUCOSAL

## 2019-11-09 MED ORDER — BUPIVACAINE-EPINEPHRINE 0.25% -1:200000 IJ SOLN
INTRAMUSCULAR | Status: AC
  Filled 2019-11-09: qty 1

## 2019-11-09 MED ORDER — RINGERS IRRIGATION IR SOLN
Status: DC | PRN
  Administered 2019-11-09: 1000 mL

## 2019-11-09 MED ORDER — ROCURONIUM BROMIDE 10 MG/ML (PF) SYRINGE
PREFILLED_SYRINGE | INTRAVENOUS | Status: DC | PRN
  Administered 2019-11-09: 70 mg via INTRAVENOUS
  Administered 2019-11-09: 10 mg via INTRAVENOUS
  Administered 2019-11-09: 20 mg via INTRAVENOUS

## 2019-11-09 MED ORDER — DEXAMETHASONE SODIUM PHOSPHATE 10 MG/ML IJ SOLN
INTRAMUSCULAR | Status: AC
  Filled 2019-11-09: qty 1

## 2019-11-09 MED ORDER — KCL IN DEXTROSE-NACL 20-5-0.45 MEQ/L-%-% IV SOLN
INTRAVENOUS | Status: DC
  Filled 2019-11-09: qty 1000

## 2019-11-09 MED ORDER — HYDROMORPHONE HCL 1 MG/ML IJ SOLN: 0.2500 mg | INTRAMUSCULAR | Status: DC | PRN

## 2019-11-09 MED ORDER — ALUM & MAG HYDROXIDE-SIMETH 200-200-20 MG/5ML PO SUSP: 30.0000 mL | Freq: Four times a day (QID) | ORAL | Status: DC | PRN

## 2019-11-09 MED ORDER — DEXAMETHASONE SODIUM PHOSPHATE 10 MG/ML IJ SOLN
INTRAMUSCULAR | Status: DC | PRN
  Administered 2019-11-09: 8 mg via INTRAVENOUS

## 2019-11-09 MED ORDER — ROCURONIUM BROMIDE 10 MG/ML (PF) SYRINGE
PREFILLED_SYRINGE | INTRAVENOUS | Status: AC
  Filled 2019-11-09: qty 10

## 2019-11-09 MED ORDER — SODIUM CHLORIDE 0.9 % IV SOLN
2.0000 g | INTRAVENOUS | Status: AC
  Administered 2019-11-09: 2 g via INTRAVENOUS
  Filled 2019-11-09: qty 2

## 2019-11-09 MED ORDER — ONDANSETRON HCL 4 MG/2ML IJ SOLN
INTRAMUSCULAR | Status: DC | PRN
  Administered 2019-11-09: 4 mg via INTRAVENOUS

## 2019-11-09 MED ORDER — 0.9 % SODIUM CHLORIDE (POUR BTL) OPTIME
TOPICAL | Status: DC | PRN
  Administered 2019-11-09: 2000 mL

## 2019-11-09 MED ORDER — BUPIVACAINE-EPINEPHRINE 0.25% -1:200000 IJ SOLN
INTRAMUSCULAR | Status: DC | PRN
  Administered 2019-11-09: 30 mL

## 2019-11-09 MED ORDER — FENTANYL CITRATE (PF) 100 MCG/2ML IJ SOLN
INTRAMUSCULAR | Status: AC
  Filled 2019-11-09: qty 2

## 2019-11-09 MED ORDER — ALVIMOPAN 12 MG PO CAPS
12.0000 mg | ORAL_CAPSULE | ORAL | Status: AC
  Administered 2019-11-09: 12 mg via ORAL
  Filled 2019-11-09: qty 1

## 2019-11-09 MED ORDER — LIDOCAINE HCL 2 % IJ SOLN
INTRAMUSCULAR | Status: AC
  Filled 2019-11-09: qty 20

## 2019-11-09 MED ORDER — FENTANYL CITRATE (PF) 250 MCG/5ML IJ SOLN
INTRAMUSCULAR | Status: AC
  Filled 2019-11-09: qty 5

## 2019-11-09 MED ORDER — ENSURE SURGERY PO LIQD
237.0000 mL | Freq: Two times a day (BID) | ORAL | Status: DC
  Administered 2019-11-10 – 2019-11-11 (×4): 237 mL via ORAL
  Filled 2019-11-09 (×6): qty 237

## 2019-11-09 MED ORDER — GABAPENTIN 300 MG PO CAPS
300.0000 mg | ORAL_CAPSULE | Freq: Two times a day (BID) | ORAL | Status: DC
  Administered 2019-11-09 – 2019-11-12 (×6): 300 mg via ORAL
  Filled 2019-11-09 (×6): qty 1

## 2019-11-09 MED ORDER — SODIUM CHLORIDE 0.9 % IV SOLN
2.0000 g | Freq: Two times a day (BID) | INTRAVENOUS | Status: AC
  Administered 2019-11-09: 2 g via INTRAVENOUS
  Filled 2019-11-09: qty 2

## 2019-11-09 MED ORDER — PHENYLEPHRINE 40 MCG/ML (10ML) SYRINGE FOR IV PUSH (FOR BLOOD PRESSURE SUPPORT)
PREFILLED_SYRINGE | INTRAVENOUS | Status: DC | PRN
  Administered 2019-11-09: 80 ug via INTRAVENOUS
  Administered 2019-11-09 (×2): 100 ug via INTRAVENOUS

## 2019-11-09 MED ORDER — GABAPENTIN 300 MG PO CAPS
300.0000 mg | ORAL_CAPSULE | ORAL | Status: AC
  Administered 2019-11-09: 300 mg via ORAL
  Filled 2019-11-09: qty 1

## 2019-11-09 MED ORDER — ACETAMINOPHEN 500 MG PO TABS
1000.0000 mg | ORAL_TABLET | Freq: Four times a day (QID) | ORAL | Status: DC
  Administered 2019-11-09 – 2019-11-12 (×11): 1000 mg via ORAL
  Filled 2019-11-09 (×11): qty 2

## 2019-11-09 MED ORDER — PROPOFOL 10 MG/ML IV BOLUS
INTRAVENOUS | Status: AC
  Filled 2019-11-09: qty 20

## 2019-11-09 MED ORDER — STERILE WATER FOR INJECTION IJ SOLN
INTRAMUSCULAR | Status: AC
  Filled 2019-11-09: qty 10

## 2019-11-09 MED ORDER — ALVIMOPAN 12 MG PO CAPS: 12.0000 mg | ORAL_CAPSULE | Freq: Two times a day (BID) | ORAL | Status: DC

## 2019-11-09 MED ORDER — ONDANSETRON HCL 4 MG PO TABS: 4.0000 mg | ORAL_TABLET | Freq: Four times a day (QID) | ORAL | Status: DC | PRN

## 2019-11-09 MED ORDER — ACETAMINOPHEN 500 MG PO TABS
1000.0000 mg | ORAL_TABLET | ORAL | Status: AC
  Administered 2019-11-09: 1000 mg via ORAL
  Filled 2019-11-09: qty 2

## 2019-11-09 MED ORDER — EPHEDRINE SULFATE-NACL 50-0.9 MG/10ML-% IV SOSY
PREFILLED_SYRINGE | INTRAVENOUS | Status: DC | PRN
  Administered 2019-11-09 (×2): 5 mg via INTRAVENOUS

## 2019-11-09 MED ORDER — ONDANSETRON HCL 4 MG/2ML IJ SOLN: 4.0000 mg | Freq: Four times a day (QID) | INTRAMUSCULAR | Status: DC | PRN

## 2019-11-09 MED ORDER — STERILE WATER FOR INJECTION IJ SOLN
INTRAMUSCULAR | Status: DC | PRN
  Administered 2019-11-09: 15 mL via SURGICAL_CAVITY

## 2019-11-09 MED ORDER — LORAZEPAM 0.5 MG PO TABS
0.5000 mg | ORAL_TABLET | Freq: Every evening | ORAL | Status: DC | PRN
  Administered 2019-11-09 – 2019-11-11 (×3): 0.5 mg via ORAL
  Filled 2019-11-09 (×3): qty 1

## 2019-11-09 MED ORDER — SUGAMMADEX SODIUM 200 MG/2ML IV SOLN
INTRAVENOUS | Status: DC | PRN
  Administered 2019-11-09: 200 mg via INTRAVENOUS

## 2019-11-09 MED ORDER — TRAMADOL HCL 50 MG PO TABS
50.0000 mg | ORAL_TABLET | Freq: Four times a day (QID) | ORAL | Status: DC | PRN
  Administered 2019-11-09 – 2019-11-12 (×11): 50 mg via ORAL
  Filled 2019-11-09 (×11): qty 1

## 2019-11-09 MED ORDER — HYDROMORPHONE HCL 1 MG/ML IJ SOLN
0.5000 mg | INTRAMUSCULAR | Status: DC | PRN
  Administered 2019-11-09 – 2019-11-11 (×5): 0.5 mg via INTRAVENOUS
  Filled 2019-11-09 (×6): qty 0.5

## 2019-11-09 MED ORDER — LIDOCAINE 2% (20 MG/ML) 5 ML SYRINGE
INTRAMUSCULAR | Status: AC
  Filled 2019-11-09: qty 5

## 2019-11-09 MED ORDER — SACCHAROMYCES BOULARDII 250 MG PO CAPS
250.0000 mg | ORAL_CAPSULE | Freq: Two times a day (BID) | ORAL | Status: DC
  Administered 2019-11-09 – 2019-11-12 (×6): 250 mg via ORAL
  Filled 2019-11-09 (×6): qty 1

## 2019-11-09 MED ORDER — UBIQUINOL 100 MG PO CAPS: 100.0000 mg | ORAL_CAPSULE | Freq: Every day | ORAL | Status: DC

## 2019-11-09 MED ORDER — ENOXAPARIN SODIUM 40 MG/0.4ML ~~LOC~~ SOLN
40.0000 mg | SUBCUTANEOUS | Status: DC
  Administered 2019-11-10 – 2019-11-12 (×3): 40 mg via SUBCUTANEOUS
  Filled 2019-11-09 (×3): qty 0.4

## 2019-11-09 MED ORDER — SIMETHICONE 80 MG PO CHEW: 40.0000 mg | CHEWABLE_TABLET | Freq: Four times a day (QID) | ORAL | Status: DC | PRN

## 2019-11-09 MED ORDER — PROPOFOL 10 MG/ML IV BOLUS
INTRAVENOUS | Status: DC | PRN
  Administered 2019-11-09: 150 mg via INTRAVENOUS

## 2019-11-09 MED ORDER — ORAL CARE MOUTH RINSE: 15.0000 mL | Freq: Once | OROMUCOSAL | Status: AC

## 2019-11-09 MED ORDER — INDOCYANINE GREEN 25 MG IV SOLR
INTRAVENOUS | Status: DC | PRN
Start: 2019-11-09 — End: 2019-11-09
  Administered 2019-11-09: 5 mg via INTRAVENOUS

## 2019-11-09 MED ORDER — FENTANYL CITRATE (PF) 250 MCG/5ML IJ SOLN
INTRAMUSCULAR | Status: DC | PRN
  Administered 2019-11-09 (×2): 50 ug via INTRAVENOUS
  Administered 2019-11-09: 100 ug via INTRAVENOUS
  Administered 2019-11-09 (×3): 50 ug via INTRAVENOUS

## 2019-11-09 MED ORDER — BUPIVACAINE LIPOSOME 1.3 % IJ SUSP
INTRAMUSCULAR | Status: DC | PRN
  Administered 2019-11-09: 20 mL

## 2019-11-09 MED ORDER — LIDOCAINE 20MG/ML (2%) 15 ML SYRINGE OPTIME
INTRAMUSCULAR | Status: DC | PRN
  Administered 2019-11-09: 1.5 mg/kg/h via INTRAVENOUS

## 2019-11-09 MED ORDER — FENTANYL CITRATE (PF) 100 MCG/2ML IJ SOLN
25.0000 ug | INTRAMUSCULAR | Status: DC | PRN
  Administered 2019-11-09 (×2): 50 ug via INTRAVENOUS

## 2019-11-09 SURGICAL SUPPLY — 99 items
BAG URO CATCHER STRL LF (MISCELLANEOUS) ×4 IMPLANT
BLADE EXTENDED COATED 6.5IN (ELECTRODE) IMPLANT
CANNULA REDUC XI 12-8 STAPL (CANNULA)
CANNULA REDUC XI 12-8MM STAPL (CANNULA)
CANNULA REDUCER 12-8 DVNC XI (CANNULA) IMPLANT
CATH URET 5FR 28IN OPEN ENDED (CATHETERS) ×4 IMPLANT
CELLS DAT CNTRL 66122 CELL SVR (MISCELLANEOUS) IMPLANT
CLOTH BEACON ORANGE TIMEOUT ST (SAFETY) ×4 IMPLANT
COVER SURGICAL LIGHT HANDLE (MISCELLANEOUS) ×8 IMPLANT
COVER TIP SHEARS 8 DVNC (MISCELLANEOUS) ×2 IMPLANT
COVER TIP SHEARS 8MM DA VINCI (MISCELLANEOUS) ×4
COVER WAND RF STERILE (DRAPES) ×2 IMPLANT
DECANTER SPIKE VIAL GLASS SM (MISCELLANEOUS) ×2 IMPLANT
DERMABOND ADVANCED (GAUZE/BANDAGES/DRESSINGS) ×2
DERMABOND ADVANCED .7 DNX12 (GAUZE/BANDAGES/DRESSINGS) IMPLANT
DRAIN CHANNEL 19F RND (DRAIN) IMPLANT
DRAPE ARM DVNC X/XI (DISPOSABLE) ×8 IMPLANT
DRAPE COLUMN DVNC XI (DISPOSABLE) ×2 IMPLANT
DRAPE DA VINCI XI ARM (DISPOSABLE) ×16
DRAPE DA VINCI XI COLUMN (DISPOSABLE) ×4
DRAPE SURG IRRIG POUCH 19X23 (DRAPES) ×4 IMPLANT
DRSG OPSITE POSTOP 4X10 (GAUZE/BANDAGES/DRESSINGS) IMPLANT
DRSG OPSITE POSTOP 4X6 (GAUZE/BANDAGES/DRESSINGS) ×2 IMPLANT
DRSG OPSITE POSTOP 4X8 (GAUZE/BANDAGES/DRESSINGS) IMPLANT
ELECT PENCIL ROCKER SW 15FT (MISCELLANEOUS) ×4 IMPLANT
ELECT REM PT RETURN 15FT ADLT (MISCELLANEOUS) ×4 IMPLANT
ENDOLOOP SUT PDS II  0 18 (SUTURE)
ENDOLOOP SUT PDS II 0 18 (SUTURE) IMPLANT
EVACUATOR SILICONE 100CC (DRAIN) IMPLANT
GLOVE BIO SURGEON STRL SZ 6.5 (GLOVE) ×9 IMPLANT
GLOVE BIO SURGEONS STRL SZ 6.5 (GLOVE) ×3
GLOVE BIOGEL M STRL SZ7.5 (GLOVE) ×4 IMPLANT
GLOVE BIOGEL PI IND STRL 7.0 (GLOVE) ×4 IMPLANT
GLOVE BIOGEL PI INDICATOR 7.0 (GLOVE) ×4
GOWN STRL REUS W/TWL XL LVL3 (GOWN DISPOSABLE) ×16 IMPLANT
GRASPER SUT TROCAR 14GX15 (MISCELLANEOUS) IMPLANT
GUIDEWIRE STR DUAL SENSOR (WIRE) IMPLANT
GUIDEWIRE ZIPWRE .038 STRAIGHT (WIRE) ×4 IMPLANT
HOLDER FOLEY CATH W/STRAP (MISCELLANEOUS) ×4 IMPLANT
IRRIG SUCT STRYKERFLOW 2 WTIP (MISCELLANEOUS) ×4
IRRIGATION SUCT STRKRFLW 2 WTP (MISCELLANEOUS) ×2 IMPLANT
KIT PROCEDURE DA VINCI SI (MISCELLANEOUS) ×4
KIT PROCEDURE DVNC SI (MISCELLANEOUS) IMPLANT
KIT TURNOVER KIT A (KITS) IMPLANT
MANIFOLD NEPTUNE II (INSTRUMENTS) ×4 IMPLANT
NDL INSUFFLATION 14GA 120MM (NEEDLE) ×2 IMPLANT
NEEDLE INSUFFLATION 14GA 120MM (NEEDLE) ×4 IMPLANT
PACK CARDIOVASCULAR III (CUSTOM PROCEDURE TRAY) ×4 IMPLANT
PACK COLON (CUSTOM PROCEDURE TRAY) ×4 IMPLANT
PACK CYSTO (CUSTOM PROCEDURE TRAY) ×4 IMPLANT
PAD POSITIONING PINK XL (MISCELLANEOUS) ×4 IMPLANT
PORT LAP GEL ALEXIS MED 5-9CM (MISCELLANEOUS) ×2 IMPLANT
RELOAD STAPLE 60 4.3 GRN DVNC (STAPLE) IMPLANT
RELOAD STAPLER 4.3X60 GRN DVNC (STAPLE) ×2 IMPLANT
RETRACTOR WND ALEXIS 18 MED (MISCELLANEOUS) IMPLANT
RTRCTR WOUND ALEXIS 18CM MED (MISCELLANEOUS)
SCISSORS LAP 5X35 DISP (ENDOMECHANICALS) ×2 IMPLANT
SEAL CANN UNIV 5-8 DVNC XI (MISCELLANEOUS) ×6 IMPLANT
SEAL XI 5MM-8MM UNIVERSAL (MISCELLANEOUS) ×16
SEALER VESSEL DA VINCI XI (MISCELLANEOUS) ×4
SEALER VESSEL EXT DVNC XI (MISCELLANEOUS) ×2 IMPLANT
SOLUTION ELECTROLUBE (MISCELLANEOUS) ×4 IMPLANT
STAPLER 45 DA VINCI SURE FORM (STAPLE)
STAPLER 45 SUREFORM DVNC (STAPLE) IMPLANT
STAPLER 60 DA VINCI SURE FORM (STAPLE) ×4
STAPLER 60 SUREFORM DVNC (STAPLE) IMPLANT
STAPLER CANNULA SEAL DVNC XI (STAPLE) IMPLANT
STAPLER CANNULA SEAL XI (STAPLE)
STAPLER ECHELON POWER CIR 29 (STAPLE) ×2 IMPLANT
STAPLER RELOAD 4.3X60 GREEN (STAPLE) ×4
STAPLER RELOAD 4.3X60 GRN DVNC (STAPLE) ×2
STOPCOCK 4 WAY LG BORE MALE ST (IV SETS) ×8 IMPLANT
SUT ETHILON 2 0 PS N (SUTURE) IMPLANT
SUT NOVA NAB DX-16 0-1 5-0 T12 (SUTURE) ×10 IMPLANT
SUT PROLENE 2 0 KS (SUTURE) ×2 IMPLANT
SUT SILK 2 0 (SUTURE) ×4
SUT SILK 2 0 SH CR/8 (SUTURE) IMPLANT
SUT SILK 2-0 18XBRD TIE 12 (SUTURE) ×2 IMPLANT
SUT SILK 3 0 (SUTURE)
SUT SILK 3 0 SH CR/8 (SUTURE) ×4 IMPLANT
SUT SILK 3-0 18XBRD TIE 12 (SUTURE) IMPLANT
SUT V-LOC BARB 180 2/0GR6 GS22 (SUTURE) ×4
SUT VIC AB 2-0 SH 18 (SUTURE) IMPLANT
SUT VIC AB 2-0 SH 27 (SUTURE) ×4
SUT VIC AB 2-0 SH 27X BRD (SUTURE) IMPLANT
SUT VIC AB 3-0 SH 18 (SUTURE) IMPLANT
SUT VIC AB 4-0 PS2 27 (SUTURE) ×8 IMPLANT
SUT VICRYL 0 UR6 27IN ABS (SUTURE) ×4 IMPLANT
SUTURE V-LC BRB 180 2/0GR6GS22 (SUTURE) IMPLANT
SYR 10ML ECCENTRIC (SYRINGE) ×4 IMPLANT
SYS LAPSCP GELPORT 120MM (MISCELLANEOUS)
SYSTEM LAPSCP GELPORT 120MM (MISCELLANEOUS) IMPLANT
TOWEL OR 17X26 10 PK STRL BLUE (TOWEL DISPOSABLE) IMPLANT
TOWEL OR NON WOVEN STRL DISP B (DISPOSABLE) ×4 IMPLANT
TRAY FOLEY MTR SLVR 14FR STAT (SET/KITS/TRAYS/PACK) ×2 IMPLANT
TROCAR ADV FIXATION 5X100MM (TROCAR) ×4 IMPLANT
TUBING CONNECTING 10 (TUBING) ×8 IMPLANT
TUBING CONNECTING 10' (TUBING) ×2
TUBING INSUFFLATION 10FT LAP (TUBING) ×4 IMPLANT

## 2019-11-09 NOTE — Anesthesia Postprocedure Evaluation (Signed)
Anesthesia Post Note  Patient: Dawn Haley  Procedure(s) Performed: ROBOTIC ASSISTED SIGMOID COLECTOMY, RIGID PROCTOSCOPY, VAGINAL FISTULA REPAIR WITH OMENTOPLASTY (N/A Abdomen) CYSTOSCOPY WITH BILATERAL FIREFLY  INJECTION (Bilateral Urethra)     Patient location during evaluation: PACU Anesthesia Type: General Level of consciousness: awake and alert Pain management: pain level controlled Vital Signs Assessment: post-procedure vital signs reviewed and stable Respiratory status: spontaneous breathing, nonlabored ventilation, respiratory function stable and patient connected to nasal cannula oxygen Cardiovascular status: blood pressure returned to baseline and stable Postop Assessment: no apparent nausea or vomiting Anesthetic complications: no   No complications documented.  Last Vitals:  Vitals:   11/09/19 1800 11/09/19 1828  BP: 116/68 123/67  Pulse: 72 75  Resp: 15 17  Temp:    SpO2: 99% 100%    Last Pain:  Vitals:   11/09/19 1830  TempSrc:   PainSc: 6                  Kashif Pooler S

## 2019-11-09 NOTE — Anesthesia Procedure Notes (Signed)
Procedure Name: Intubation Date/Time: 11/09/2019 1:27 PM Performed by: Anne Fu, CRNA Pre-anesthesia Checklist: Patient identified, Emergency Drugs available, Suction available, Patient being monitored and Timeout performed Patient Re-evaluated:Patient Re-evaluated prior to induction Oxygen Delivery Method: Circle system utilized Preoxygenation: Pre-oxygenation with 100% oxygen Induction Type: IV induction Ventilation: Mask ventilation without difficulty Laryngoscope Size: Mac and 4 Grade View: Grade III Tube type: Oral Tube size: 7.5 mm Number of attempts: 1 Airway Equipment and Method: Stylet and Bougie stylet Placement Confirmation: ETT inserted through vocal cords under direct vision,  positive ETCO2 and breath sounds checked- equal and bilateral Secured at: 22 cm Tube secured with: Tape Dental Injury: Teeth and Oropharynx as per pre-operative assessment  Comments: DL with Grade III view bougie used  X 1 attempt with positive equal ETCO2 present -- Easy Mask.

## 2019-11-09 NOTE — Op Note (Signed)
11/09/2019  4:07 PM  PATIENT:  Dawn Haley  64 y.o. female  Patient Care Team: Catha Gosselin, MD as PCP - General (Family Medicine)  PRE-OPERATIVE DIAGNOSIS:  COLOVAGINAL FISTULA  POST-OPERATIVE DIAGNOSIS:  COLOVAGINAL FISTULA  PROCEDURE:   ROBOTIC ASSISTED SIGMOID COLECTOMY, RIGID PROCTOSCOPY, VAGINAL FISTULA REPAIR WITH OMENTOPLASTY, INTRAOPERATIVE ASSESSMENT OF VASCULAR PERFUSION CYSTOSCOPY WITH BILATERAL FIREFLY  INJECTION   Surgeon(s): Rene Paci, MD Karie Soda, MD Romie Levee, MD  ASSISTANT: Dr Michaell Cowing   ANESTHESIA:   local and general  EBL:25 ml  Total I/O In: 1100 [I.V.:1000; IV Piggyback:100] Out: 625 [Urine:600; Blood:25]  Delay start of Pharmacological VTE agent (>24hrs) due to surgical blood loss or risk of bleeding:  no  DRAINS: none   SPECIMEN:  Source of Specimen:  Sigmoid colon  DISPOSITION OF SPECIMEN:  PATHOLOGY  COUNTS:  YES  PLAN OF CARE: Admit to inpatient   PATIENT DISPOSITION:  PACU - hemodynamically stable.  INDICATION:    Pt presented to the office with colovaginal fistula.  I recommended segmental resection:  The anatomy & physiology of the digestive tract was discussed.  The pathophysiology was discussed.  Natural history risks without surgery was discussed.   I worked to give an overview of the disease and the frequent need to have multispecialty involvement.  I feel the risks of no intervention will lead to serious problems that outweigh the operative risks; therefore, I recommended a partial colectomy to remove the pathology.  Laparoscopic & open techniques were discussed.   Risks such as bleeding, infection, abscess, leak, reoperation, possible ostomy, hernia, heart attack, death, and other risks were discussed.  I noted a good likelihood this will help address the problem.   Goals of post-operative recovery were discussed as well.    The patient expressed understanding & wished to proceed with surgery.  OR  FINDINGS:   Patient had colovaginal fistula involving the mid sigmoid to the left lateral vaginal wall  The anastomosis rests 10 cm from the anal verge by rigid proctoscopy.  DESCRIPTION:   Informed consent was confirmed.  The patient underwent general anaesthesia without difficulty.  The patient was positioned appropriately.  VTE prevention in place.  The patient's abdomen was clipped, prepped, & draped in a sterile fashion.  Surgical timeout confirmed our plan.  The patient was positioned in reverse Trendelenburg.  Abdominal entry was gained using a Varies needle in the LUQ.  Entry was clean.  I induced carbon dioxide insufflation.  An 71mm robotic port was placed in the RUQ.  Camera inspection revealed no injury.  Extra ports were carefully placed under direct laparoscopic visualization.  I laparoscopically reflected the greater omentum and the upper abdomen the small bowel in the upper abdomen. The patient was appropriately positioned and the robot was docked to the patient's left side.  Instruments were placed under direct visualization.    I mobilized the sigmoid colon off of the pelvic sidewall using the vessel sealer. There were significant adhesions throughout the left lateral pelvic sidewall. I carefully dissected these using mostly blunt dissection and the vessel sealer. I continued into the pelvis where I was able to separate the colon from the vaginal wall using blunt dissection. The fistula was noted in the proximal vaginal canal. Once this was freed, the colon was elevated out of the pelvis.   I scored the base of peritoneum of the right side of the mesentery of the left colon from the ligament of Treitz to the peritoneal reflection of the  mid rectum.  I elevated the sigmoid mesentery and enetered into the retro-mesenteric plane. We were able to identify the left ureter and gonadal vessels. We kept those posterior within the retroperitoneum and elevated the left colon mesentery off that. I  did isolated IMA pedicle but did not ligate it yet.  I continued distally and got into the avascular plane posterior to the mesorectum. This allowed me to help mobilize the rectum as well by freeing the mesorectum off the sacrum.  I mobilized the peritoneal coverings towards the peritoneal reflection on both the right and left sides of the rectum.  I could see the right and left ureters and stayed away from them.    I skeletonized the inferior mesenteric artery pedicle.  After confirming the left ureter was out of the way, I went ahead and ligated the inferior mesenteric artery pedicle with bipolar robotic vessel sealer well above its takeoff from the aorta.    We ensured hemostasis. I bluntly separated the retroperitoneal plane to allow for movement of the descending colon into the pelvis.  I skeletonized the mesorectum at the junction at the proximal rectum using blunt dissection & bipolar robotic vessel sealer.  I mobilized the left colon in a lateral to medial fashion off the line of Toldt up towards the splenic flexure to ensure good mobilization of the left colon to reach into the pelvis.  Once this was complete the mesentery of the colon was divided using the robotic vessel sealer to the level of the descending sigmoid junction. We then tested to make sure the colon was well perfused. 3 mL of indocyanine green was injected intravascularly. This was evaluated using the robotic camera. There was good perfusion noted up until the dissection point in the descending colon there was good perfusion of the rectum. I then had my nurse place a clean dilator into the vaginal canal. I was able to evaluate the fistula completely. It was on the left side of the proximal vagina. It was approximately 5 to 7 mm in diameter. This was closed using a 2-0 V lock suture. I then mobilized the omentum off of the left side of the abdominal wall to allow for mobilization into the pelvis. This was done using the robotic vessel  sealer and blunt dissection. Once this reached into the pelvis I used a V lock suture to secure this to the vaginal closure for additional protection against refistulization. I then placed a green load 60 mm stapler into the 12 mm suprapubic port. The rectosigmoid junction was transected using the stapler.  At this point, the robot was undocked and the suprapubic 12 mm port site was enlarged to a Pfannenstiel incision. An Alexis wound protector was placed. The colon was removed from the abdomen. The remaining colon was divided using electrocautery over a pursestring device. A 2-0 Prolene pursestring suture was placed. The pursestring was tied over a 29 mm EEA anvil. This was then placed back into the abdomen. An Alexis wound protector Was placed. The abdomen was reinsufflated. I dilated the rectum using a 29 mm EEA dilator. I then introduced a stapler into the rectum and brought this out through the distal rectal stump. An anastomosis was created. There was no tension. There was no leak when tested with insufflation under irrigation. I evaluated the vaginal closure internally. Once I confirmed the vaginal wall was completely closed we removed the V-Loc suture from the abdomen. We then switched to clean gowns, gloves, instruments and drapes. The peritoneum of  the Pfannenstiel incision was closed using a running 0 Vicryl suture. The fascia was closed using interrupted #1 Novafil sutures. The subcutaneous tissue was reapproximated using a running 2-0 Vicryl suture. The skin was closed using a running 4-0 Vicryl subcuticular suture. A dressing was applied. The port sites were closed using 4-0 Vicryl subcuticular sutures and Dermabond. The patient was then awakened from anesthesia and sent to the postanesthesia care unit in stable condition. All counts were correct per operating room staff.  An MD assistant was necessary for tissue manipulation, retraction and positioning due to the complexity of the case and  hospital policies

## 2019-11-09 NOTE — H&P (Signed)
Urology Preoperative H&P   Chief Complaint: Diverticulitis  History of Present Illness: Dawn Haley is a 64 y.o. 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  She is here today for partial colectomy and colovaginal fistula repair.  Urology has been consulted to inject firefly to assist in identifying the left ureter.  She is urinating without difficulty denies interval UTIs, dysuria or gross hematuria.  Past Medical History:  Diagnosis Date  . Diverticulitis   . Dyspnea    walking up hill  . Headache 11/01/2019    Past Surgical History:  Procedure Laterality Date  . ABDOMINAL HYSTERECTOMY    . APPENDECTOMY    . CYST EXCISION Right 11/02/2015   Procedure: RIGHT INDEX EXCISION MASS ;  Surgeon: Betha Loa, MD;  Location: [facility];  Service: Orthopedics;  Laterality: Right;  . DIAGNOSTIC LAPAROSCOPY    . TONSILLECTOMY    . URETHRAL DILATION      Allergies:  Allergies  Allergen Reactions  . Codeine Hives, Rash and Other (See Comments)    Intensifies pain instead of relieving  . Adhesive [Tape] Rash  . Other Other (See Comments)    Certain cholesterol medications give her cramps, she can't remember which ones  . Pentothal [Thiopental] Rash    Family History  Problem Relation Age of Onset  . Heart disease Father   . Stroke Father   . Diabetes Father   . Alzheimer's disease Mother     Social History:  reports that she has never smoked. She has never used smokeless tobacco. She reports current alcohol use. She reports that she does not use drugs.  ROS: A complete review of systems was performed.  All systems are negative except for pertinent findings as noted.  Physical Exam:  Vital signs in last 24 hours: Temp:  [98.6 F (37 C)] 98.6 F (37 C) (06/30 1123) Pulse Rate:  [79] 79 (06/30 1123) Resp:  [16] 16 (06/30 1123) BP: (141)/(79) 141/79 (06/30 1123) SpO2:  [100 %] 100 % (06/30 1123) Weight:  [98.2 kg] 98.2 kg  (06/30 1123) Constitutional:  Alert and oriented, No acute distress Cardiovascular: Regular rate and rhythm, No JVD Respiratory: Normal respiratory effort, Lungs clear bilaterally GI: Abdomen is soft, nontender, nondistended, no abdominal masses GU: No CVA tenderness Lymphatic: No lymphadenopathy Neurologic: Grossly intact, no focal deficits Psychiatric: Normal mood and affect  Laboratory Data:  No results for input(s): WBC, HGB, HCT, PLT in the last 72 hours.  No results for input(s): NA, K, CL, GLUCOSE, BUN, CALCIUM, CREATININE in the last 72 hours.  Invalid input(s): CO3   No results found for this or any previous visit (from the past 24 hour(s)). Recent Results (from the past 240 hour(s))  SARS CORONAVIRUS 2 (TAT 6-24 HRS) Nasopharyngeal Nasopharyngeal Swab     Status: None   Collection Time: 11/05/19  9:18 AM   Specimen: Nasopharyngeal Swab  Result Value Ref Range Status   SARS Coronavirus 2 NEGATIVE NEGATIVE Final    Comment: (NOTE) SARS-CoV-2 target nucleic acids are NOT DETECTED.  The SARS-CoV-2 RNA is generally detectable in upper and lower respiratory specimens during the acute phase of infection. Negative results do not preclude SARS-CoV-2 infection, do not rule out co-infections with other pathogens, and should not be used as the sole basis for treatment or other patient management decisions. Negative results must be combined with clinical observations, patient history, and epidemiological information. The expected result is Negative.  Fact Sheet for Patients:  HairSlick.no  Fact Sheet for Healthcare Providers: quierodirigir.com  This test is not yet approved or cleared by the [place] FDA and  has been authorized for detection and/or diagnosis of SARS-CoV-2 by FDA under an Emergency Use Authorization (EUA). This EUA will remain  in effect (meaning this test can be used) for the duration of  the COVID-19 declaration under Se ction 564(b)(1) of the Act, 21 U.S.C. section 360bbb-3(b)(1), unless the authorization is terminated or revoked sooner.  Performed at [facility], [address], [place], [place] [address]     Renal Function: No results for input(s): CREATININE in the last 168 hours. Estimated Creatinine Clearance: 75.6 mL/min (by C-G formula based on SCr of 0.95 mg/dL).  Radiologic Imaging: No results found.  I independently reviewed the above imaging studies.  Assessment and Plan Dawn Haley is a 64 y.o. female with a colovaginal fistula with plans for robotic partial colectomy with Dr. Maisie Fus.  The risk, benefits and alternatives of cystoscopy with left ureteral firefly injection was discussed with the patient.  She voices understanding and wishes to proceed.  Rhoderick Moody, MD 11/09/2019, 12:32 PM  [facility] Pager: [phone]

## 2019-11-09 NOTE — Transfer of Care (Signed)
Immediate Anesthesia Transfer of Care Note  Patient: Dawn Haley  Procedure(s) Performed: Procedure(s): ROBOTIC ASSISTED SIGMOID COLECTOMY, RIGID PROCTOSCOPY, VAGINAL FISTULA REPAIR WITH OMENTOPLASTY (N/A) CYSTOSCOPY WITH BILATERAL FIREFLY  INJECTION (Bilateral)  Patient Location: PACU  Anesthesia Type:General  Level of Consciousness:  sedated, patient cooperative and responds to stimulation  Airway & Oxygen Therapy:Patient Spontanous Breathing and Patient connected to face mask oxgen  Post-op Assessment:  Report given to PACU RN and Post -op Vital signs reviewed and stable  Post vital signs:  Reviewed and stable  Last Vitals:  Vitals:   11/09/19 1123  BP: (!) 141/79  Pulse: 79  Resp: 16  Temp: 37 C  SpO2: 100%    Complications: No apparent anesthesia complications

## 2019-11-09 NOTE — H&P (Signed)
The patient is a 64 year old female who presents with diverticulitis. 64 year old female who presents to the office after undergoing an episode of diverticulitis last fall. She underwent a follow-up CT scan which showed resolution in December. She then underwent a colonoscopy in April 2021. Colonoscopy report was reviewed. This shows that ultrasound scope was used to navigate a sigmoid stricture. Multiple diverticula were found in the sigmoid colon. There was no sign of malignancy. Since that time she has had trouble with vaginal bleeding and drainage. There is concern for a colovaginal fistula. Gynecologic exam was negative for any vaginal or cervical malignancies. Patient is status post open hysterectomy and oophorectomy as well as appendectomy. She has no major past medical history. Her vaginal drainage is ongoing.   Past Surgical History (Tanisha A. Manson Passey, RMA; 09/26/2019 2:57 PM) Appendectomy Hysterectomy (not due to cancer) - Complete Oral Surgery Tonsillectomy  Diagnostic Studies History (Tanisha A. Manson Passey, RMA; 09/26/2019 2:57 PM) Colonoscopy within last year Mammogram within last year Pap Smear 1-5 years ago  Allergies (Tanisha A. Manson Passey, RMA; 09/26/2019 3:00 PM) CODEINE Pentothal *GENERAL ANESTHETICS* Allergies Reconciled  Medication History (Tanisha A. Manson Passey, RMA; 09/26/2019 3:02 PM) Multi-Vitamin (Oral) Active. Fish Oil (1200MG  Capsule, Oral) Active. Garlic (600MG  Capsule, Oral) Active. Vitamin D3 (125 MCG(5000 UT) Tablet, Oral) Active. Ashwagandha (500MG  Capsule, Oral) Active. Chelated Calcium (200MG  Tablet, Oral) Active. Ubiquinol (100MG  Capsule, Oral) Active. Loratadine (5MG  Tablet Chewable, Oral) Active. Claritin (10MG  Tablet, Oral) Active. Flonase (50MCG/DOSE Inhaler, Nasal) Active. Probiotic (Oral) Specific strength unknown - Active. Medications Reconciled  Social History (Tanisha A. , RMA; 09/26/2019 2:57 PM) Alcohol use  Moderate alcohol use. Caffeine use Coffee. No drug use Tobacco use Never smoker.  Family History (Tanisha A. , RMA; 09/26/2019 2:57 PM) Alcohol Abuse Sister. Arthritis Mother. Cerebrovascular Accident Father. Depression Daughter. Diabetes Mellitus Sister. Heart disease in female family member before age 57 Ischemic Bowel Disease Sister. Migraine Headache Father.  Pregnancy / Birth History (Tanisha A. , RMA; 09/26/2019 2:57 PM) Age at menarche 13 years. Age of menopause <44 Contraceptive History Oral contraceptives. Gravida 5 Length (months) of breastfeeding 12-24 Maternal age 61-25 Para 2  Other Problems (Tanisha A. Manson Passey, RMA; 09/26/2019 2:57 PM) Bladder Problems Diverticulosis Hemorrhoids Hypercholesterolemia Oophorectomy Bilateral. Other disease, cancer, significant illness     Review of Systems  General Present- Fatigue and Night Sweats. Not Present- Appetite Loss, Chills, Fever, Weight Gain and Weight Loss. HEENT Present- Seasonal Allergies and Wears glasses/contact lenses. Not Present- Earache, Hearing Loss, Hoarseness, Nose Bleed, Oral Ulcers, Ringing in the Ears, Sinus Pain, Sore Throat, Visual Disturbances and Yellow Eyes. Breast Not Present- Breast Mass, Breast Pain, Nipple Discharge and Skin Changes. Cardiovascular Not Present- Chest Pain, Difficulty Breathing Lying Down, Leg Cramps, Palpitations, Rapid Heart Rate, Shortness of Breath and Swelling of Extremities. Gastrointestinal Present- Abdominal Pain, Bloating, Change in Bowel Habits and Hemorrhoids. Not Present- Bloody Stool, Chronic diarrhea, Constipation, Difficulty Swallowing, Excessive gas, Gets full quickly at meals, Indigestion, Nausea, Rectal Pain and Vomiting. Female Genitourinary Present- Frequency, Pelvic Pain and Urgency. Not Present- Nocturia and Painful Urination. Musculoskeletal Not Present- Back Pain, Joint Pain, Joint Stiffness, Muscle Pain, Muscle Weakness  and Swelling of Extremities. Neurological Not Present- Decreased Memory, Fainting, Headaches, Numbness, Seizures, Tingling, Tremor, Trouble walking and Weakness. Psychiatric Not Present- Anxiety, Bipolar, Change in Sleep Pattern, Depression, Fearful and Frequent crying. Endocrine Not Present- Cold Intolerance, Excessive Hunger, Hair Changes, Heat Intolerance, Hot flashes and New Diabetes.  BP (!) 141/79   Pulse 79   Temp 98.6  F (37 C) (Oral)   Resp 16   Ht 5' 10.5" (1.791 m)   Wt 98.2 kg   SpO2 100%   BMI 30.61 kg/m    Physical Exam   General Mental Status-Alert. General Appearance-Cooperative.  Abdomen Palpation/Percussion Palpation and Percussion of the abdomen reveal - Soft and Non Tender. 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 We will get a CT scan of the pelvis with rectal contrast to evaluate its location. I suspect we will need far flange injection by urology in order to identify the ureter on the left side. We have discussed a robotic approach to this. All questions were answered. The surgery and anatomy were described to the patient as well as the risks of surgery and the possible complications. These include: Bleeding, deep abdominal infections and possible wound complications such as hernia and infection, damage to adjacent structures, leak of surgical connections, which can lead to other surgeries and possibly an ostomy, possible need for other procedures, such as abscess drains in radiology, possible prolonged hospital stay, possible diarrhea from removal of part of the colon, possible constipation from narcotics, possible bowel, bladder or sexual dysfunction if having rectal surgery, prolonged fatigue/weakness or appetite loss, possible early recurrence of of disease, possible complications of their medical problems such as heart  disease or arrhythmias or lung problems, death (less than 1%). I believe the patient

## 2019-11-09 NOTE — Op Note (Signed)
Operative Note  Preoperative diagnosis:  1.  Colovaginal fistula  Postoperative diagnosis: 1.  Colovaginal fistula  Procedure(s): 1.  Cystoscopy with bilateral ureteral FireFly injections  Surgeon: Rhoderick Moody, MD  Assistants:  None   Anesthesia:  General  Complications:  None  EBL:  <5 mL  Specimens: 1. None  Drains/Catheters: 1.  16 French Foley  Intraoperative findings:   1. No intravesical pathology was seen on cystoscopy  Indication: Ms. 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  She is here today for a robotic partial colectomy with Dr. Maisie Fus.  Urology has been consulted to inject firefly into each ureter to help with intraoperative identification.  The patient has been consented for the above procedures, voices understanding and wishes to procede  Description of procedure: After informed consent was obtained, the patient was brought to the operating room and general endotracheal anesthesia was administered. The patient was then placed in the dorsolithotomy position and prepped and draped in usual sterile fashion. A timeout was performed. A 21 French rigid cystoscope was then inserted into the urethral meatus and advanced into the bladder under direct vision. A complete bladder survey revealed no intravesical pathology.   A 6 [place] open-ended catheter was then used to intubate the right ureteral orifice and a total of 7.5 mL of firefly solution diluted with 10 mL of saline was injected into the right collecting system. A similar maneuver was then carried out on the left with the same volume and concentration of firefly. The rigid cystoscope was then removed under direct vision. A 16 French Foley catheter was then inserted and placed to gravity drainage. The patient tolerated the procedure well. Dr. Maisie Fus then proceeded with their portion of the case  Plan:  Foley removal is at the discretion of the primary  team.

## 2019-11-10 ENCOUNTER — Other Ambulatory Visit: Payer: Self-pay

## 2019-11-10 ENCOUNTER — Encounter (HOSPITAL_COMMUNITY): Payer: Self-pay | Admitting: General Surgery

## 2019-11-10 LAB — CBC
HCT: 39.9 % (ref 36.0–46.0)
Hemoglobin: 13.1 g/dL (ref 12.0–15.0)
MCH: 29.8 pg (ref 26.0–34.0)
MCHC: 32.8 g/dL (ref 30.0–36.0)
MCV: 90.7 fL (ref 80.0–100.0)
Platelets: 214 10*3/uL (ref 150–400)
RBC: 4.4 MIL/uL (ref 3.87–5.11)
RDW: 13.5 % (ref 11.5–15.5)
WBC: 12 10*3/uL — ABNORMAL HIGH (ref 4.0–10.5)
nRBC: 0 % (ref 0.0–0.2)

## 2019-11-10 LAB — BASIC METABOLIC PANEL
Anion gap: 8 (ref 5–15)
BUN: 8 mg/dL (ref 8–23)
CO2: 22 mmol/L (ref 22–32)
Calcium: 8.7 mg/dL — ABNORMAL LOW (ref 8.9–10.3)
Chloride: 105 mmol/L (ref 98–111)
Creatinine, Ser: 0.78 mg/dL (ref 0.44–1.00)
GFR calc Af Amer: >60 mL/min (ref >60)
GFR calc non Af Amer: >60 mL/min (ref >60)
Glucose, Bld: 176 mg/dL — ABNORMAL HIGH (ref 70–99)
Potassium: 4.2 mmol/L (ref 3.5–5.1)
Sodium: 135 mmol/L (ref 135–145)

## 2019-11-10 NOTE — Plan of Care (Signed)

## 2019-11-10 NOTE — Progress Notes (Signed)
Got pt OOB to ambulate for the first time post-op, pain well managed, pt sat at bedside for about 5 minutes, able to ambulate with walker out to the hallway and then started to feel dizzy and lightheaded, placed pt in recliner and obtained bp- was orthostatic at 72/45, patient felt weak and sweaty, after a period of resting about 15 minutes and wiping face with cool washcloth, bp recheck was 98/65, Mews documentation completed with no initiation of yellow mews protocol due to bp being an acute event of orthostatic hypotension, pt feels back to baseline and was able to transfer back to bed with minimal assist, encouraged fluids, pt having no N/V so diet was advanced to full liquids, also encouraged IS, will continue to monitor

## 2019-11-10 NOTE — Progress Notes (Signed)
1 Day Post-Op Robotic Sigmoidectomy  Subjective: Had an episode of orthostatic hypotension last night.  Feels good this AM.  Tolerating clears.  Had a BM.  Pain controlled  Objective: Vital signs in last 24 hours: Temp:  [97.3 F (36.3 C)-98.6 F (37 C)] 97.5 F (36.4 C) (07/01 0637) Pulse Rate:  [66-84] 71 (07/01 0637) Resp:  [11-18] 18 (07/01 0637) BP: (72-141)/(45-79) 113/55 (07/01 0637) SpO2:  [95 %-100 %] 95 % (07/01 0637) Weight:  [98.2 kg] 98.2 kg (06/30 1123)   Intake/Output from previous day: 06/30 0701 - 07/01 0700 In: 2839.3 [P.O.:505; I.V.:2234.3; IV Piggyback:100] Out: 2075 [Urine:2050; Blood:25] Intake/Output this shift: No intake/output data recorded.   General appearance: alert and cooperative GI: normal findings: soft, non-tender  Incision: no significant drainage  Lab Results:  Recent Labs    11/10/19 0500  WBC 12.0*  HGB 13.1  HCT 39.9  PLT 214   BMET Recent Labs    11/10/19 0500  NA 135  K 4.2  CL 105  CO2 22  GLUCOSE 176*  BUN 8  CREATININE 0.78  CALCIUM 8.7*   PT/INR No results for input(s): LABPROT, INR in the last 72 hours. ABG No results for input(s): PHART, HCO3 in the last 72 hours.  Invalid input(s): PCO2, PO2  MEDS, Scheduled . acetaminophen  1,000 mg Oral Q6H  . alvimopan  12 mg Oral BID  . enoxaparin (LOVENOX) injection  40 mg Subcutaneous Q24H  . feeding supplement  237 mL Oral BID BM  . gabapentin  300 mg Oral BID  . saccharomyces boulardii  250 mg Oral BID    Studies/Results: No results found.  Assessment: s/p Procedure(s): ROBOTIC ASSISTED SIGMOID COLECTOMY, RIGID PROCTOSCOPY, VAGINAL FISTULA REPAIR WITH OMENTOPLASTY CYSTOSCOPY WITH BILATERAL FIREFLY  INJECTION Patient Active Problem List   Diagnosis Date Noted  . 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  . 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 Hypocalcemia 10/27/2019  . Sigmoid diverticulitis 10/26/2019  . Chest pain 06/06/2015  . GERD (gastroesophageal reflux  disease) 06/06/2015  . Costochondritis 06/06/2015    Expected post op course  Plan: d/c foley Advance diet  Ambulate with assistance SL IVF's    LOS: 1 day     .Vanita Panda, MD [facility], [place]    11/10/2019 7:37 AM

## 2019-11-10 NOTE — Progress Notes (Signed)
Patient expresses limited movement of her left leg moving it outward. States that it was like this after surgery and was told it was due to how she was positioned on her bed. She also expresses tightness of her L lateral thigh muscle. She is still able to ambulate and has sensation to her S1. MD made aware of this.

## 2019-11-11 LAB — SURGICAL PATHOLOGY

## 2019-11-11 LAB — CBC
HCT: 34.2 % — ABNORMAL LOW (ref 36.0–46.0)
Hemoglobin: 11.2 g/dL — ABNORMAL LOW (ref 12.0–15.0)
MCH: 29.2 pg (ref 26.0–34.0)
MCHC: 32.7 g/dL (ref 30.0–36.0)
MCV: 89.3 fL (ref 80.0–100.0)
Platelets: 193 10*3/uL (ref 150–400)
RBC: 3.83 MIL/uL — ABNORMAL LOW (ref 3.87–5.11)
RDW: 13.9 % (ref 11.5–15.5)
WBC: 9.3 10*3/uL (ref 4.0–10.5)
nRBC: 0 % (ref 0.0–0.2)

## 2019-11-11 NOTE — Progress Notes (Signed)
2 Days Post-Op Robotic Sigmoidectomy  Subjective: Had some increased pain last night. Tolerating soft diet and having BM's.  Leg pain much better  Objective: Vital signs in last 24 hours: Temp:  [98.3 F (36.8 C)-99 F (37.2 C)] 98.3 F (36.8 C) (07/02 0639) Pulse Rate:  [75-86] 75 (07/02 0639) Resp:  [14-18] 18 (07/02 0639) BP: (111-126)/(53-66) 117/66 (07/02 0639) SpO2:  [95 %-99 %] 97 % (07/02 0639)   Intake/Output from previous day: 07/01 0701 - 07/02 0700 In: 800 [P.O.:800] Out: 2150 [Urine:2150] Intake/Output this shift: No intake/output data recorded.   General appearance: alert and cooperative GI: normal findings: soft, non-tender  Incision: no significant drainage  Lab Results:  Recent Labs    11/10/19 0500 11/11/19 0437  WBC 12.0* 9.3  HGB 13.1 11.2*  HCT 39.9 34.2*  PLT 214 193   BMET Recent Labs    11/10/19 0500  NA 135  K 4.2  CL 105  CO2 22  GLUCOSE 176*  BUN 8  CREATININE 0.78  CALCIUM 8.7*   PT/INR No results for input(s): LABPROT, INR in the last 72 hours. ABG No results for input(s): PHART, HCO3 in the last 72 hours.  Invalid input(s): PCO2, PO2  MEDS, Scheduled . acetaminophen  1,000 mg Oral Q6H  . enoxaparin (LOVENOX) injection  40 mg Subcutaneous Q24H  . feeding supplement  237 mL Oral BID BM  . gabapentin  300 mg Oral BID  . saccharomyces boulardii  250 mg Oral BID    Studies/Results: No results found.  Assessment: s/p Procedure(s): ROBOTIC ASSISTED SIGMOID COLECTOMY, RIGID PROCTOSCOPY, VAGINAL FISTULA REPAIR WITH OMENTOPLASTY CYSTOSCOPY WITH BILATERAL FIREFLY  INJECTION Patient Active Problem List   Diagnosis Date Noted  . 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  . 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 Hypocalcemia 10/27/2019  . Sigmoid diverticulitis 10/26/2019  . Chest pain 06/06/2015  . GERD (gastroesophageal reflux disease) 06/06/2015  . Costochondritis 06/06/2015    Expected post op course  Plan: Will cont in  house until pain is better Pt does not want anything stronger than Tramadol Ambulate with assistance     LOS: 2 days     .Vanita Panda, MD [facility], [place]    11/11/2019 7:41 AM

## 2019-11-12 LAB — CBC
HCT: 36 % (ref 36.0–46.0)
Hemoglobin: 11.6 g/dL — ABNORMAL LOW (ref 12.0–15.0)
MCH: 29.7 pg (ref 26.0–34.0)
MCHC: 32.2 g/dL (ref 30.0–36.0)
MCV: 92.1 fL (ref 80.0–100.0)
Platelets: 228 10*3/uL (ref 150–400)
RBC: 3.91 MIL/uL (ref 3.87–5.11)
RDW: 14.1 % (ref 11.5–15.5)
WBC: 7.5 10*3/uL (ref 4.0–10.5)
nRBC: 0 % (ref 0.0–0.2)

## 2019-11-12 MED ORDER — TRAMADOL HCL 50 MG PO TABS: 50.0000 mg | ORAL_TABLET | Freq: Four times a day (QID) | ORAL | 0 refills | Status: DC | PRN

## 2019-11-12 NOTE — Discharge Instructions (Signed)

## 2019-11-12 NOTE — Discharge Summary (Signed)
Physician Discharge Summary  Patient ID: Dawn Haley MRN: [id number] DOB/AGE: Nov 08, 1955 64 y.o.  Admit date: 11/09/2019 Discharge date: 11/12/2019  Admission Diagnoses: colovaginal fistula  Discharge Diagnoses:  Active Problems:   Colovaginal fistula   Discharged Condition: good  Hospital Course: 64 y.o. F with colovaginal fistula.  She was admitted after robotic sigmoidectomy.  Her diet was advanced as tolerated.  She has had multiple BM's.  She was discharged to home in stable condition.  Her pain is controlled with tramadol and tylenol  Consults: None  Significant Diagnostic Studies: labs: cbc, bmet  Treatments: IV hydration  Discharge Exam: Blood pressure 139/71, pulse 70, temperature 97.6 F (36.4 C), temperature source Oral, resp. rate 17, height 5' 10.5" (1.791 m), weight 98.2 kg, SpO2 97 %. General appearance: alert and cooperative GI: normal findings: soft, non-tender Incision/Wound: clean, dry, intact  Disposition: Discharge disposition: 01-Home or Self Care        Allergies as of 11/12/2019      Reactions   Codeine Hives, Rash, Other (See Comments)   Intensifies pain instead of relieving   Adhesive [tape] Rash   Other Other (See Comments)   Certain cholesterol medications give her cramps, she can't remember which ones   Pentothal [thiopental] Rash      Medication List    TAKE these medications   ASHWAGANDHA PO Take 800 mg by mouth in the morning and at bedtime.   CHELATED MAGNESIUM PO Take 200 mg by mouth daily.   Fish Oil 1200 MG Caps Take 1,200 mg by mouth daily.   fluticasone 50 MCG/ACT nasal spray Commonly known as: FLONASE Place 1 spray into both nostrils daily as needed for allergies.   GARLIC PO Take 1 capsule by mouth daily.   loratadine-pseudoephedrine 5-120 MG tablet Commonly known as: CLARITIN-D 12-hour Take 1 tablet by mouth daily.   LORazepam 0.5 MG tablet Commonly known as: ATIVAN Take 0.5 mg by mouth at bedtime as  needed for sleep.   multivitamin with minerals Tabs tablet Take 1 tablet by mouth daily.   OVER THE COUNTER MEDICATION Take 2 capsules by mouth daily. Adrenal Support Blend   PROBIOTIC PO Take 1 capsule by mouth daily.   traMADol 50 MG tablet Commonly known as: ULTRAM Take 1 tablet (50 mg total) by mouth every 6 (six) hours as needed (mild pain).   Ubiquinol 100 MG Caps Take 100 mg by mouth daily.   Vitamin D 125 MCG (5000 UT) Caps Take 5,000 Units by mouth daily.       Follow-up Information    Romie Levee, MD. Schedule an appointment as soon as possible for a visit in 2 week(s).   Specialty: General Surgery Contact information: [address] [place] [address] [phone]               Signed: Vanita Panda 11/12/2019, 8:28 AM

## 2020-05-16 DIAGNOSIS — Z7189 Other specified counseling: Secondary | ICD-10-CM | POA: Diagnosis not present

## 2020-06-25 DIAGNOSIS — E785 Hyperlipidemia, unspecified: Secondary | ICD-10-CM | POA: Diagnosis not present

## 2020-06-25 DIAGNOSIS — Z79899 Other long term (current) drug therapy: Secondary | ICD-10-CM | POA: Diagnosis not present

## 2020-08-28 ENCOUNTER — Other Ambulatory Visit: Payer: Self-pay | Admitting: Family Medicine

## 2020-08-28 ENCOUNTER — Other Ambulatory Visit: Payer: Managed Care, Other (non HMO)

## 2020-08-28 DIAGNOSIS — R1032 Left lower quadrant pain: Secondary | ICD-10-CM

## 2020-08-28 DIAGNOSIS — R739 Hyperglycemia, unspecified: Secondary | ICD-10-CM | POA: Diagnosis not present

## 2020-08-28 DIAGNOSIS — R109 Unspecified abdominal pain: Secondary | ICD-10-CM | POA: Diagnosis not present

## 2020-08-29 ENCOUNTER — Other Ambulatory Visit: Payer: Self-pay

## 2020-08-29 ENCOUNTER — Ambulatory Visit
Admission: RE | Admit: 2020-08-29 | Discharge: 2020-08-29 | Disposition: A | Discharge status: Home / Self Care | Payer: BC Managed Care – PPO | Source: Ambulatory Visit | Attending: Family Medicine | Admitting: Family Medicine

## 2020-08-29 DIAGNOSIS — Z90710 Acquired absence of both cervix and uterus: Secondary | ICD-10-CM | POA: Diagnosis not present

## 2020-08-29 DIAGNOSIS — R1032 Left lower quadrant pain: Secondary | ICD-10-CM

## 2020-08-29 DIAGNOSIS — I700 Atherosclerosis of aorta: Secondary | ICD-10-CM | POA: Diagnosis not present

## 2020-08-29 MED ORDER — IOPAMIDOL (ISOVUE-300) INJECTION 61%
100.0000 mL | Freq: Once | INTRAVENOUS | Status: AC | PRN
  Administered 2020-08-29: 100 mL via INTRAVENOUS

## 2020-08-31 DIAGNOSIS — Z9049 Acquired absence of other specified parts of digestive tract: Secondary | ICD-10-CM | POA: Diagnosis not present

## 2020-08-31 DIAGNOSIS — R739 Hyperglycemia, unspecified: Secondary | ICD-10-CM | POA: Diagnosis not present

## 2020-08-31 DIAGNOSIS — R1032 Left lower quadrant pain: Secondary | ICD-10-CM | POA: Diagnosis not present

## 2020-10-15 DIAGNOSIS — R059 Cough, unspecified: Secondary | ICD-10-CM | POA: Diagnosis not present

## 2020-10-20 DIAGNOSIS — R0981 Nasal congestion: Secondary | ICD-10-CM | POA: Diagnosis not present

## 2020-10-20 DIAGNOSIS — R059 Cough, unspecified: Secondary | ICD-10-CM | POA: Diagnosis not present

## 2020-11-15 DIAGNOSIS — M62830 Muscle spasm of back: Secondary | ICD-10-CM | POA: Diagnosis not present

## 2020-11-15 DIAGNOSIS — G479 Sleep disorder, unspecified: Secondary | ICD-10-CM | POA: Diagnosis not present

## 2020-12-04 DIAGNOSIS — R0981 Nasal congestion: Secondary | ICD-10-CM | POA: Diagnosis not present

## 2020-12-04 DIAGNOSIS — J069 Acute upper respiratory infection, unspecified: Secondary | ICD-10-CM | POA: Diagnosis not present

## 2020-12-04 DIAGNOSIS — J029 Acute pharyngitis, unspecified: Secondary | ICD-10-CM | POA: Diagnosis not present

## 2020-12-04 DIAGNOSIS — R059 Cough, unspecified: Secondary | ICD-10-CM | POA: Diagnosis not present

## 2020-12-20 DIAGNOSIS — R7301 Impaired fasting glucose: Secondary | ICD-10-CM | POA: Diagnosis not present

## 2020-12-20 DIAGNOSIS — E785 Hyperlipidemia, unspecified: Secondary | ICD-10-CM | POA: Diagnosis not present

## 2020-12-21 DIAGNOSIS — B373 Candidiasis of vulva and vagina: Secondary | ICD-10-CM | POA: Diagnosis not present

## 2020-12-21 DIAGNOSIS — G479 Sleep disorder, unspecified: Secondary | ICD-10-CM | POA: Diagnosis not present

## 2020-12-21 DIAGNOSIS — Z0000 Encounter for general adult medical examination without abnormal findings: Secondary | ICD-10-CM | POA: Diagnosis not present

## 2020-12-21 DIAGNOSIS — E785 Hyperlipidemia, unspecified: Secondary | ICD-10-CM | POA: Diagnosis not present

## 2020-12-21 DIAGNOSIS — M25561 Pain in right knee: Secondary | ICD-10-CM | POA: Diagnosis not present

## 2021-05-01 DIAGNOSIS — R519 Headache, unspecified: Secondary | ICD-10-CM | POA: Diagnosis not present

## 2021-05-01 DIAGNOSIS — J069 Acute upper respiratory infection, unspecified: Secondary | ICD-10-CM | POA: Diagnosis not present

## 2021-05-01 DIAGNOSIS — R059 Cough, unspecified: Secondary | ICD-10-CM | POA: Diagnosis not present

## 2021-05-01 DIAGNOSIS — Z03818 Encounter for observation for suspected exposure to other biological agents ruled out: Secondary | ICD-10-CM | POA: Diagnosis not present

## 2021-06-24 DIAGNOSIS — H9201 Otalgia, right ear: Secondary | ICD-10-CM | POA: Diagnosis not present

## 2021-08-13 IMAGING — CT CT PELVIS W/O CM
1 series · 15 of 32 positions shown, 19 images · non-contrast
Comparison: 04/21/2019

CLINICAL DATA: Preop for colectomy. Diverticulitis. Evaluate for
fistula.

EXAM:
CT PELVIS WITHOUT CONTRAST
TECHNIQUE: Multidetector CT imaging of the pelvis was performed following the
standard protocol without intravenous contrast.

[Series 2: routine pelvis w/(date) · axial · 0.93mm/px · z∈[-301,-21]mm · 15 of 64 slices shown, 19 images]
[im 5/64  soft-tissue]
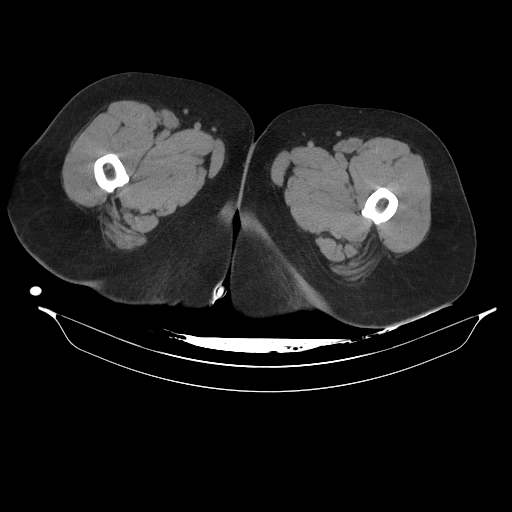
[im 5/64  bone]
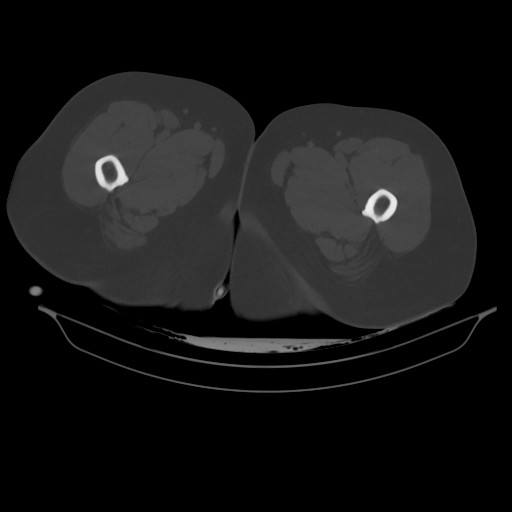
[im 9/64  soft-tissue]
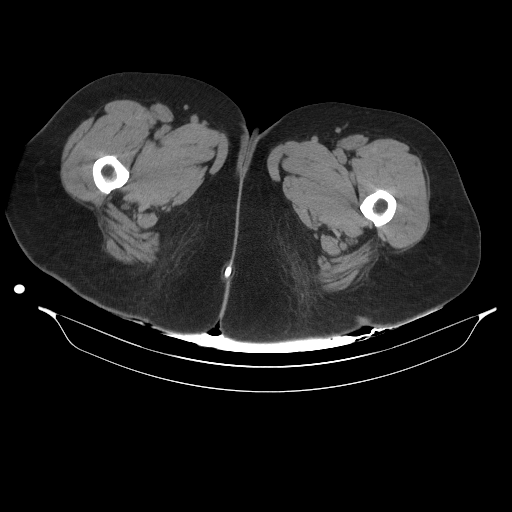
[im 13/64  soft-tissue]
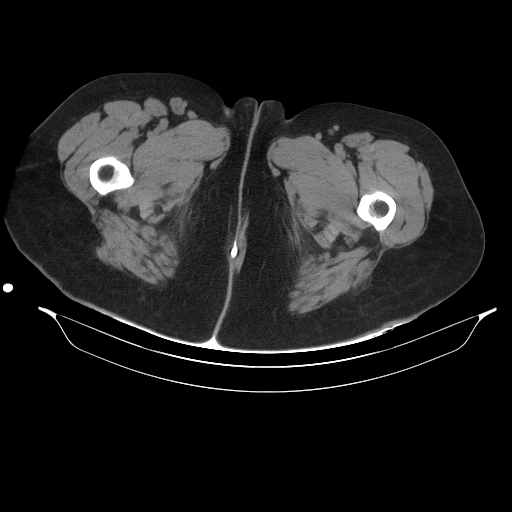
[im 19/64  soft-tissue]
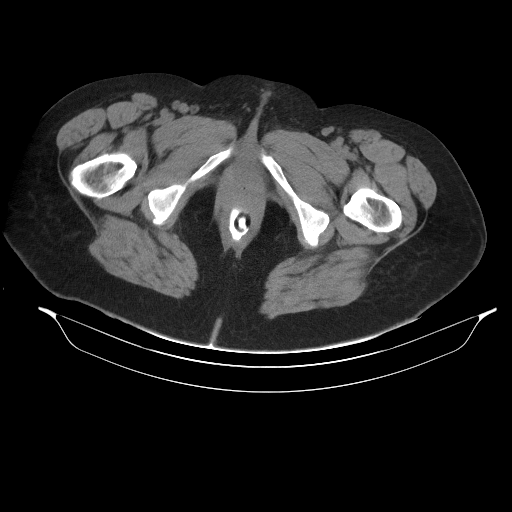
[im 23/64  soft-tissue]
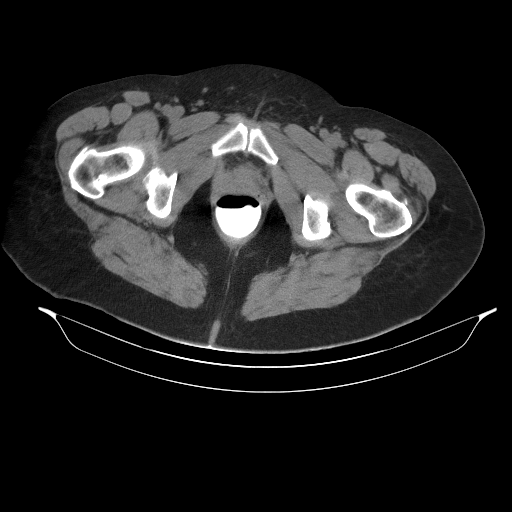
[im 27/64  soft-tissue]
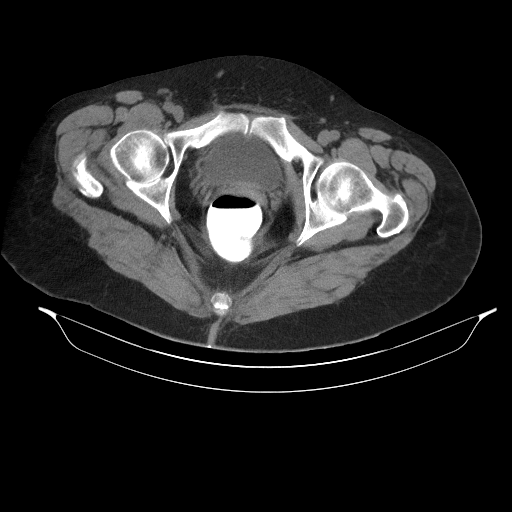
[im 33/64  soft-tissue]
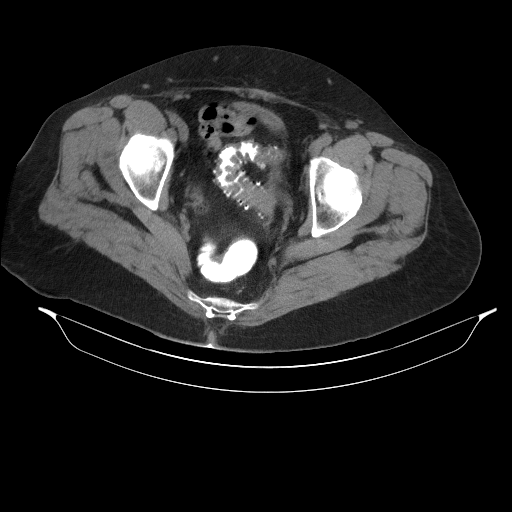
[im 37/64  soft-tissue]
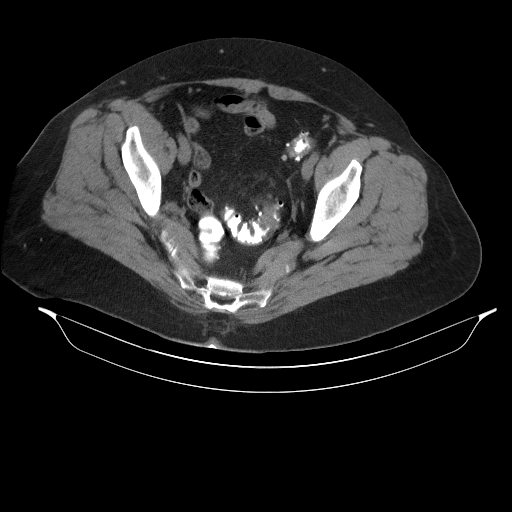
[im 41/64  soft-tissue]
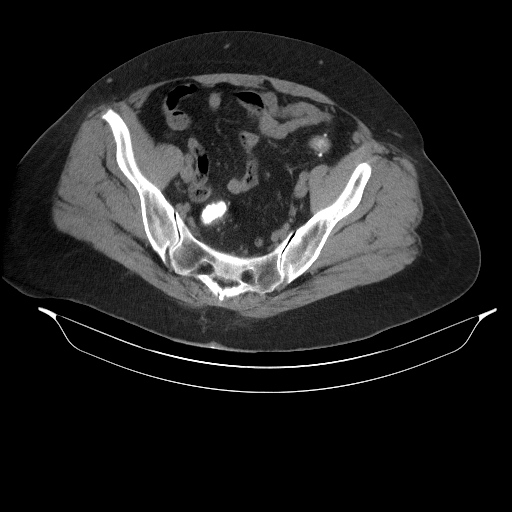
[im 41/64  bone]
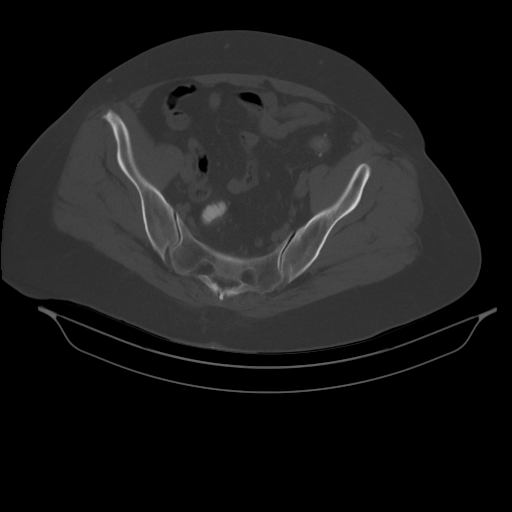
[im 45/64  soft-tissue]
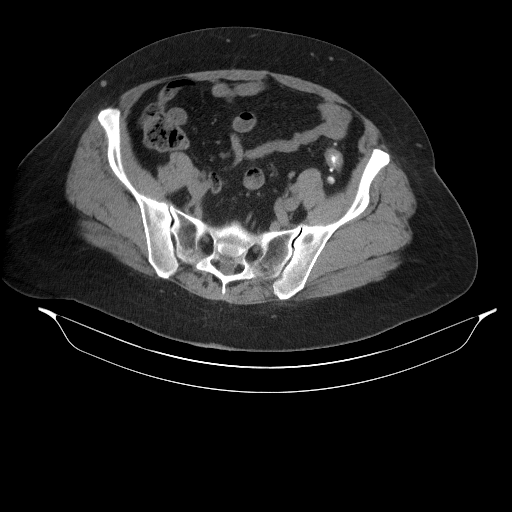
[im 51/64  soft-tissue]
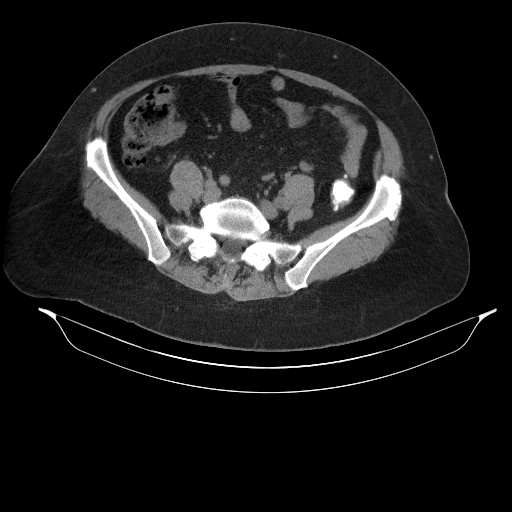
[im 55/64  soft-tissue]
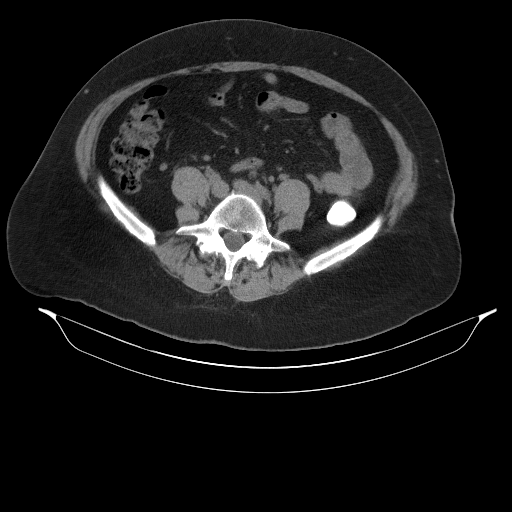
[im 55/64  lung]
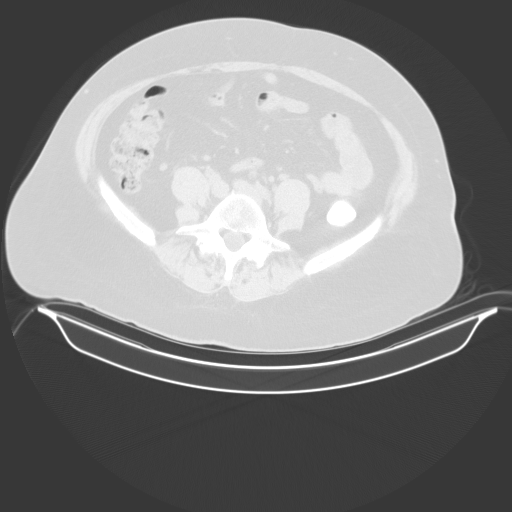
[im 57/64  lung]
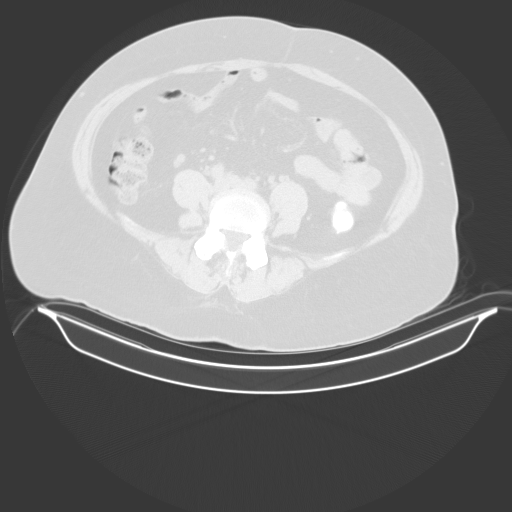
[im 59/64  soft-tissue]
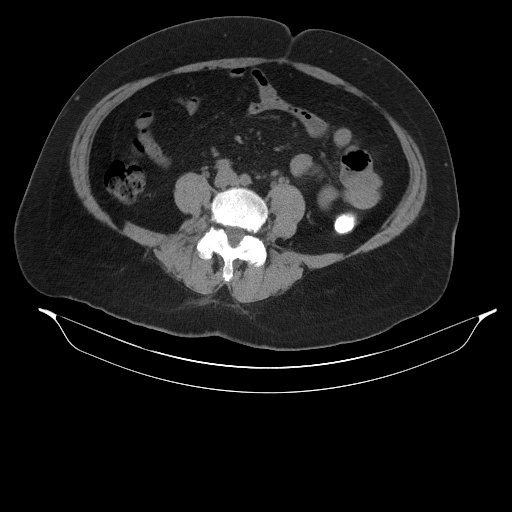
[im 59/64  lung]
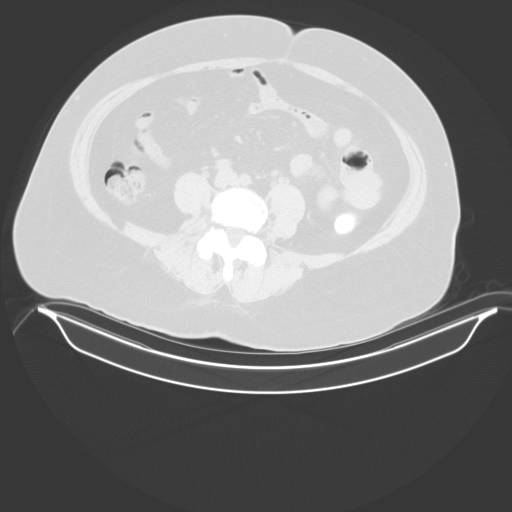
[im 61/64  lung]
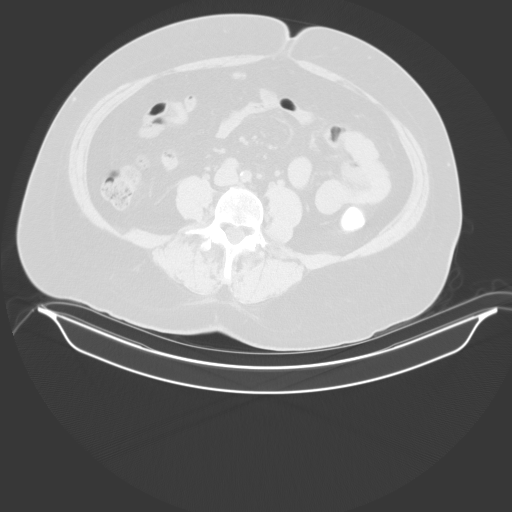

[15 of 32 positions shown; findings below may reference images not displayed]

FINDINGS: Urinary Tract: No distal hydroureter. Normal noncontrast appearance
of the urinary bladder. No significant wall thickening or
intraluminal gas to suggest fistula. No rectal contrast within the
bladder.

Bowel: Rectal contrast to the level of the transverse colon.
Extensive colonic diverticulosis. Wall thickening involves the
sigmoid, likely due to muscular hypertrophy. Suspect minimal
interstitial thickening within the sigmoid mesocolon including on
[DATE]. No fluid collection or free perforation. Normal terminal
ileum. Appendectomy. Normal small bowel.

Vascular/Lymphatic: Aortic atherosclerosis. No pelvic sidewall
adenopathy.

Reproductive:  Hysterectomy.  No air or contrast within the vagina.

Other:  No significant free fluid.

Musculoskeletal: No acute osseous abnormality.
IMPRESSION: 1. Extensive colonic diverticulosis. Suspicion of mild sigmoid
diverticulitis. No findings of fistulous communication.
2.  Aortic Atherosclerosis ([id number]).

## 2021-10-15 DIAGNOSIS — Z6833 Body mass index (BMI) 33.0-33.9, adult: Secondary | ICD-10-CM | POA: Diagnosis not present

## 2021-10-15 DIAGNOSIS — F430 Acute stress reaction: Secondary | ICD-10-CM | POA: Diagnosis not present

## 2021-10-28 DIAGNOSIS — H43813 Vitreous degeneration, bilateral: Secondary | ICD-10-CM | POA: Diagnosis not present

## 2021-10-28 DIAGNOSIS — H5213 Myopia, bilateral: Secondary | ICD-10-CM | POA: Diagnosis not present

## 2021-10-28 DIAGNOSIS — H43393 Other vitreous opacities, bilateral: Secondary | ICD-10-CM | POA: Diagnosis not present

## 2021-10-28 DIAGNOSIS — H52223 Regular astigmatism, bilateral: Secondary | ICD-10-CM | POA: Diagnosis not present

## 2021-11-15 ENCOUNTER — Ambulatory Visit (INDEPENDENT_AMBULATORY_CARE_PROVIDER_SITE_OTHER): Payer: Medicare Other | Admitting: Psychologist

## 2021-11-15 DIAGNOSIS — F411 Generalized anxiety disorder: Secondary | ICD-10-CM | POA: Diagnosis not present
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
## 2021-11-15 NOTE — Plan of Care (Signed)

## 2021-11-15 NOTE — Progress Notes (Signed)
                Zo Loudon, PsyD 

## 2021-12-12 ENCOUNTER — Ambulatory Visit: Payer: Medicare Other | Admitting: Psychologist

## 2021-12-23 DIAGNOSIS — E785 Hyperlipidemia, unspecified: Secondary | ICD-10-CM | POA: Diagnosis not present

## 2021-12-23 DIAGNOSIS — R7301 Impaired fasting glucose: Secondary | ICD-10-CM | POA: Diagnosis not present

## 2021-12-23 DIAGNOSIS — Z0000 Encounter for general adult medical examination without abnormal findings: Secondary | ICD-10-CM | POA: Diagnosis not present

## 2022-01-24 DIAGNOSIS — E785 Hyperlipidemia, unspecified: Secondary | ICD-10-CM | POA: Diagnosis not present

## 2022-01-24 DIAGNOSIS — I700 Atherosclerosis of aorta: Secondary | ICD-10-CM | POA: Diagnosis not present

## 2022-01-24 DIAGNOSIS — R7303 Prediabetes: Secondary | ICD-10-CM | POA: Diagnosis not present

## 2022-01-24 DIAGNOSIS — Z0000 Encounter for general adult medical examination without abnormal findings: Secondary | ICD-10-CM | POA: Diagnosis not present

## 2022-01-25 ENCOUNTER — Other Ambulatory Visit: Payer: Self-pay

## 2022-01-25 ENCOUNTER — Encounter (HOSPITAL_COMMUNITY): Payer: Self-pay

## 2022-01-25 ENCOUNTER — Emergency Department (HOSPITAL_COMMUNITY): Payer: Medicare Other

## 2022-01-25 ENCOUNTER — Emergency Department (HOSPITAL_COMMUNITY)
Admission: EM | Admit: 2022-01-25 | Discharge: 2022-01-25 | Disposition: A | Discharge status: Home / Self Care | Payer: Medicare Other | Attending: Emergency Medicine | Admitting: Emergency Medicine

## 2022-01-25 DIAGNOSIS — R42 Dizziness and giddiness: Secondary | ICD-10-CM | POA: Diagnosis not present

## 2022-01-25 DIAGNOSIS — R002 Palpitations: Secondary | ICD-10-CM | POA: Diagnosis not present

## 2022-01-25 DIAGNOSIS — I4891 Unspecified atrial fibrillation: Secondary | ICD-10-CM | POA: Diagnosis not present

## 2022-01-25 DIAGNOSIS — I499 Cardiac arrhythmia, unspecified: Secondary | ICD-10-CM | POA: Diagnosis not present

## 2022-01-25 DIAGNOSIS — Z7901 Long term (current) use of anticoagulants: Secondary | ICD-10-CM | POA: Diagnosis not present

## 2022-01-25 LAB — T4, FREE: Free T4: 0.97 ng/dL (ref 0.61–1.12)

## 2022-01-25 LAB — COMPREHENSIVE METABOLIC PANEL
ALT: 32 U/L (ref 0–44)
AST: 30 U/L (ref 15–41)
Albumin: 4.2 g/dL (ref 3.5–5.0)
Alkaline Phosphatase: 66 U/L (ref 38–126)
Anion gap: 11 (ref 5–15)
BUN: 14 mg/dL (ref 8–23)
CO2: 22 mmol/L (ref 22–32)
Calcium: 9.5 mg/dL (ref 8.9–10.3)
Chloride: 105 mmol/L (ref 98–111)
Creatinine, Ser: 1.11 mg/dL — ABNORMAL HIGH (ref 0.44–1.00)
GFR, Estimated: 54 mL/min — ABNORMAL LOW (ref >60)
Glucose, Bld: 117 mg/dL — ABNORMAL HIGH (ref 70–99)
Potassium: 3.7 mmol/L (ref 3.5–5.1)
Sodium: 138 mmol/L (ref 135–145)
Total Bilirubin: 0.7 mg/dL (ref 0.3–1.2)
Total Protein: 7.4 g/dL (ref 6.5–8.1)

## 2022-01-25 LAB — CBC
HCT: 43.9 % (ref 36.0–46.0)
Hemoglobin: 15.2 g/dL — ABNORMAL HIGH (ref 12.0–15.0)
MCH: 30.7 pg (ref 26.0–34.0)
MCHC: 34.6 g/dL (ref 30.0–36.0)
MCV: 88.7 fL (ref 80.0–100.0)
Platelets: 251 10*3/uL (ref 150–400)
RBC: 4.95 MIL/uL (ref 3.87–5.11)
RDW: 12.7 % (ref 11.5–15.5)
WBC: 7.2 10*3/uL (ref 4.0–10.5)
nRBC: 0 % (ref 0.0–0.2)

## 2022-01-25 LAB — URINALYSIS, ROUTINE W REFLEX MICROSCOPIC
Bilirubin Urine: NEGATIVE
Glucose, UA: NEGATIVE mg/dL
Ketones, ur: 5 mg/dL — AB
Leukocytes,Ua: NEGATIVE
Nitrite: NEGATIVE
Protein, ur: NEGATIVE mg/dL
Specific Gravity, Urine: 1.002 — ABNORMAL LOW (ref 1.005–1.030)
pH: 7 (ref 5.0–8.0)

## 2022-01-25 LAB — CK: Total CK: 136 U/L (ref 38–234)

## 2022-01-25 LAB — MAGNESIUM: Magnesium: 2 mg/dL (ref 1.7–2.4)

## 2022-01-25 LAB — TSH: TSH: 1.866 u[IU]/mL (ref 0.350–4.500)

## 2022-01-25 LAB — TROPONIN I (HIGH SENSITIVITY)
Troponin I (High Sensitivity): 6 ng/L (ref <18)
Troponin I (High Sensitivity): 12 ng/L (ref <18)

## 2022-01-25 MED ORDER — METOPROLOL TARTRATE 25 MG PO TABS: 25.0000 mg | ORAL_TABLET | Freq: Two times a day (BID) | ORAL | 0 refills | Status: AC | PRN

## 2022-01-25 MED ORDER — DILTIAZEM HCL 25 MG/5ML IV SOLN
10.0000 mg | Freq: Once | INTRAVENOUS | Status: AC
  Administered 2022-01-25: 10 mg via INTRAVENOUS
  Filled 2022-01-25: qty 5

## 2022-01-25 MED ORDER — METOPROLOL TARTRATE 5 MG/5ML IV SOLN
5.0000 mg | Freq: Once | INTRAVENOUS | Status: AC
  Administered 2022-01-25: 5 mg via INTRAVENOUS
  Filled 2022-01-25: qty 5

## 2022-01-25 MED ORDER — LACTATED RINGERS IV BOLUS
500.0000 mL | Freq: Once | INTRAVENOUS | Status: AC
  Administered 2022-01-25: 500 mL via INTRAVENOUS

## 2022-01-25 MED ORDER — APIXABAN 5 MG PO TABS: 5.0000 mg | ORAL_TABLET | Freq: Two times a day (BID) | ORAL | 0 refills | Status: DC

## 2022-01-25 MED ORDER — APIXABAN 5 MG PO TABS
5.0000 mg | ORAL_TABLET | ORAL | Status: AC
  Administered 2022-01-25: 5 mg via ORAL
  Filled 2022-01-25: qty 1

## 2022-01-25 NOTE — Discharge Instructions (Addendum)
You were seen here today for evaluation of your increased heart rate.  It was discovered that you are in atrial fibrillation.  We were able to control your rate with medications.  You will need to follow-up with the atrial fibrillation clinic.  I have sent an ambulatory referral, however if you do not hear within for the next few days, you need to call to schedule an appointment.  I am sending you home on Eliquis.  You will take this twice daily for the next 30 days unless told otherwise by cardiology.  Again, please make sure you call to schedule an appointment.  If you have any chest pain, shortness of breath, leg swelling, confusion, headaches, unilateral weakness, lightheadedness, near syncope, fainting, please return to the nearest emergency department for reevaluation.  Contact a doctor if: You notice a change in the speed, rhythm, or strength of your heartbeat. You are taking a blood-thinning medicine and you get more bruising. You get tired more easily when you move or exercise. You have a sudden change in weight. Get help right away if:  You have pain in your chest or your belly (abdomen). You have trouble breathing. You have side effects of blood thinners, such as blood in your vomit, poop (stool), or pee (urine), or bleeding that cannot stop. You have any signs of a stroke. "BE FAST" is an easy way to remember the main warning signs: B - Balance. Signs are dizziness, sudden trouble walking, or loss of balance. E - Eyes. Signs are trouble seeing or a change in how you see. F - Face. Signs are sudden weakness or loss of feeling in the face, or the face or eyelid drooping on one side. A - Arms. Signs are weakness or loss of feeling in an arm. This happens suddenly and usually on one side of the body. S - Speech. Signs are sudden trouble speaking, slurred speech, or trouble understanding what people say. T - Time. Time to call emergency services. Write down what time symptoms started. You  have other signs of a stroke, such as: A sudden, very bad headache with no known cause. Feeling like you may vomit (nausea). Vomiting. A seizure. These symptoms may be an emergency. Do not wait to see if the symptoms will go away. Get medical help right away. Call your local emergency services (911 in the [place] Do not drive yourself to the hospital.

## 2022-01-25 NOTE — ED Provider Notes (Signed)
This patient is a 66 year old female, she has a history of some allergies for which she takes loratadine with pseudoephedrine which she did take today, she has also had some alcohol use over the last couple of weeks because of stress at work.  She reports that today she had the acute onset of feeling of palpitations.  She was measuring tachycardia at home as fast as 160, she also had some urinary frequency in the ambulance.  She has no fevers or chills, no nausea or vomiting, no diarrhea or aches or pains or chest pain or shortness of breath or coughing.  On exam the patient is in atrial fibrillation with a rapid ventricular rate with pulses between 110 and 140.  She has a normal blood pressure in fact she is slightly hypertensive.  She has a soft nontender abdomen with no edema and is otherwise well-appearing with a normal mental status.  EKG confirms A-fib  Patient likely has some element of holiday heart given her use of the Claritin-D and the increased alcohol and also her age of 66 years old.  She is not anticoagulated and is not unstable that she will not need to be cardioverted.  We will plan on dose of rate control during the investigatory.  This patient spontaneously converted after a couple of doses of AV blocking agents, she is now symptom-free.  She is concerned about her excessive urination today but there is no signs of infection and laboratory work-up has been unremarkable.  I discussed the case with cardiology, Dr. Terri Skains, he recommends 4 weeks of anticoagulation, follow-up with A-fib clinic.  Patient stable for discharge  Medical screening examination/treatment/procedure(s) were conducted as a shared visit with non-physician practitioner(s) and myself.  I personally evaluated the patient during the encounter.  Clinical Impression:   Final diagnoses:  New onset atrial fibrillation ([place])         Noemi Chapel, MD 01/27/22 1104

## 2022-01-25 NOTE — ED Triage Notes (Signed)
Pt arrived via [facility] from home. Pt c/o palpitations, dizziness and nausea that started 17mins prior to EMS arrival. EMS found pt in A-fib w/RVR. HR 150-160. EMS gave cardizem 10mg  IV brought the HR down to 120 then HR went back up to 130-150 so EMS gave a second round of cardizem 10mg  IV. Pt is A&Ox4. Pt is eupneic.

## 2022-01-25 NOTE — ED Provider Notes (Signed)
[facility] EMERGENCY DEPARTMENT Provider Note   CSN: [id number] Arrival date & time: 01/25/22  1638     History Chief Complaint  Patient presents with   Atrial Fibrillation    Dawn Haley is a 66 y.o. female otherwise healthy presents the emergency department for evaluation of palpitations today.  Patient reports that she was in her kitchen making spaghetti when she felt her heart was racing.  She felt mildly lightheaded and nauseous.  She reports that she put on her husband's Pulsoxymeter and reported her heart rate was between 55 and 155.  They called 911.  She reported that she was a little lightheaded and slightly nauseous again.  She reports that she had a full bladder and needed to use the restroom before going EMS, however did not have a chance.  While on the ambulance, she could not hold her bladder and urinated on herself.  Patient reports that she has been having difficulty making it to the bathroom enough time today for urination.  Additionally, she mentions that she had some lower leg cramping that was resolved after her husband wrapped her legs.  She reports that she has been having increased water intake as she is trying to stay more well-hydrated.  She reports that she has been drinking more often lately.  She took Claritin-D this morning.  She is on several herbal and supplement medications.  Allergic to codeine and statins.  Denies any tobacco or illicit drug use.   Atrial Fibrillation Pertinent negatives include no chest pain, no abdominal pain and no shortness of breath.       Home Medications Prior to Admission medications   Medication Sig Start Date End Date Taking? Authorizing Provider  apixaban (ELIQUIS) 5 MG TABS tablet Take 1 tablet (5 mg total) by mouth 2 (two) times daily. 01/25/22 02/24/22 Yes Sherrell Puller, PA-C  OVER THE COUNTER MEDICATION Take 2 capsules by mouth daily. Adrenal Support Blend   Yes [provider]  ASHWAGANDHA PO  Take 800 mg by mouth in the morning and at bedtime.     [provider]  CHELATED MAGNESIUM PO Take 200 mg by mouth daily.    [provider]  Cholecalciferol (VITAMIN D) 125 MCG (5000 UT) CAPS Take 5,000 Units by mouth daily.     [provider]  fluticasone (FLONASE) 50 MCG/ACT nasal spray Place 1 spray into both nostrils daily as needed for allergies.     [provider]  GARLIC PO Take 1 capsule by mouth daily.     [provider]  loratadine-pseudoephedrine (CLARITIN-D 12-HOUR) 5-120 MG tablet Take 1 tablet by mouth daily.    [provider]  LORazepam (ATIVAN) 0.5 MG tablet Take 0.5 mg by mouth at bedtime as needed for sleep.  05/30/15   [provider]  Multiple Vitamin (MULTIVITAMIN WITH MINERALS) TABS tablet Take 1 tablet by mouth daily.    [provider]  Omega-3 Fatty Acids (FISH OIL) 1200 MG CAPS Take 1,200 mg by mouth daily.     [provider]  Probiotic Product (PROBIOTIC PO) Take 1 capsule by mouth daily.     [provider]  traMADol (ULTRAM) 50 MG tablet Take 1 tablet (50 mg total) by mouth every 6 (six) hours as needed (mild pain). [id number]   Leighton Ruff, MD  Ubiquinol 100 MG CAPS Take 100 mg by mouth daily.     [provider]      Allergies  Codeine, Adhesive [tape], Other, and Pentothal [thiopental]    Review of Systems   Review of Systems  Constitutional:  Negative for chills and fever.  Respiratory:  Negative for shortness of breath.   Cardiovascular:  Positive for palpitations. Negative for chest pain.  Gastrointestinal:  Positive for nausea. Negative for abdominal pain and vomiting.  Genitourinary:  Positive for frequency. Negative for dysuria and hematuria.  Musculoskeletal:  Positive for myalgias.  Neurological:  Positive for light-headedness. Negative for syncope.    Physical Exam Updated Vital Signs BP (!) 114/57   Pulse 66   Temp 98 F (36.7 C) (Oral)    Resp 11   Ht 5' 10.5" (1.791 m)   Wt 98.2 kg   SpO2 97%   BMI 30.62 kg/m  Physical Exam Vitals and nursing note reviewed.  Constitutional:      General: She is not in acute distress.    Appearance: Normal appearance. She is not ill-appearing, toxic-appearing or diaphoretic.  HENT:     Head: Normocephalic and atraumatic.  Eyes:     General: No scleral icterus. Cardiovascular:     Rate and Rhythm: Tachycardia present. Rhythm irregular.     Comments: Palpable radial, DP, PT pulses bilaterally. Pulmonary:     Effort: Pulmonary effort is normal. No respiratory distress.     Breath sounds: Normal breath sounds.  Abdominal:     General: There is no distension.     Palpations: Abdomen is soft.     Tenderness: There is no abdominal tenderness. There is no guarding or rebound.  Musculoskeletal:        General: No deformity.     Cervical back: Normal range of motion.  Skin:    General: Skin is warm and dry.  Neurological:     General: No focal deficit present.     Mental Status: She is alert. Mental status is at baseline.     ED Results / Procedures / Treatments   Labs (all labs ordered are listed, but only abnormal results are displayed) Labs Reviewed  CBC - Abnormal; Notable for the following components:      Result Value   Hemoglobin 15.2 (*)    All other components within normal limits  COMPREHENSIVE METABOLIC PANEL - Abnormal; Notable for the following components:   Glucose, Bld 117 (*)    Creatinine, Ser 1.11 (*)    GFR, Estimated 54 (*)    All other components within normal limits  URINALYSIS, ROUTINE W REFLEX MICROSCOPIC - Abnormal; Notable for the following components:   Color, Urine COLORLESS (*)    Specific Gravity, Urine 1.002 (*)    Hgb urine dipstick SMALL (*)    Ketones, ur 5 (*)    Bacteria, UA RARE (*)    All other components within normal limits  TSH  T4, FREE  MAGNESIUM  CK  TROPONIN I (HIGH SENSITIVITY)  TROPONIN I (HIGH SENSITIVITY)     EKG EKG Interpretation  Date/Time:  Saturday January 25 2022 16:48:46 EDT Ventricular Rate:  114 PR Interval:    QRS Duration: 94 QT Interval:  330 QTC Calculation: 455 R Axis:   63 Text Interpretation: Atrial fibrillation Ventricular premature complex Minimal ST depression, diffuse leads Since last tracing Atrial fibrillation NOW PRESENT Confirmed by Noemi Chapel [phone]) on 01/25/2022 4:59:54 PM  Radiology DG Chest Portable 1 View  Result Date: 01/25/2022 CLINICAL DATA:  Atrial fibrillation. EXAM: PORTABLE CHEST 1 VIEW COMPARISON:  06/06/2015. FINDINGS: Cardiac silhouette is normal in size. Normal mediastinal and  hilar contours. Clear lungs.  No pleural effusion or pneumothorax. Skeletal structures are grossly intact. IMPRESSION: No active disease. Electronically Signed   By: Lajean Manes M.D.   On: 01/25/2022 17:23    Procedures Procedures   Medications Ordered in ED Medications  metoprolol tartrate (LOPRESSOR) injection 5 mg (5 mg Intravenous Given 01/25/22 1747)  lactated ringers bolus 500 mL (0 mLs Intravenous Stopped 01/25/22 1855)  diltiazem (CARDIZEM) injection 10 mg (10 mg Intravenous Given 01/25/22 1855)  apixaban (ELIQUIS) tablet 5 mg (5 mg Oral Given 01/25/22 2125)    ED Course/ Medical Decision Making/ A&P Clinical Course as of 01/25/22 2248  Sat Jan 25, 2022  2242 DG Chest Portable 1 View [RR]    Clinical Course User Index [RR] Sherrell Puller, PA-C                           Medical Decision Making Amount and/or Complexity of Data Reviewed Labs: ordered. Radiology: ordered. Decision-making details documented in ED Course.  Risk Prescription drug management.   66 year old female presents the emergency department for evaluation of palpitations, lightheadedness, and mild nausea.  EKG shows A-fib.  Differential diagnosis includes was limited to ACS, hyperthyroidism, electrolyte abnormality, arrhythmia.  Vital signs show elevated blood pressure, irregular  irregular heartbeat, afebrile, satting well on room air without increased work of breathing.  Physical exam as noted above.  Per EMS, she received 20 mg of diltiazem IV together. After discussion with my attending, will order 5 mg of Lopressor as well as 500 mL of fluid.  Labs ordered.  I independently reviewed and interpreted the patient's labs.  Normal magnesium, normal CK, normal TSH, normal free T4.  Urinalysis shows dilute colorless urine with 5 ketones and small in hemoglobin.  Rare bacteria but 0-5 white blood cells seen.  CBC shows mild increase hemoglobin of 15.2 otherwise no leukocytosis or thrombocytopenia/thrombocytosis seen.  CMP shows mildly elevated glucose at 117, creatinine at 1.11 which appears at baseline.  No other LFT or electrolyte abnormality.  Troponin at 6 repeat at 12, delta 6.   Chest x-ray shows no acute cardiopulmonary process.  EKG reviewed and interpreted by my attending and shows atrial fibrillation.  On reevaluation, after 5 of Lopressor, 20 of diltiazem, the patient is still remaining in the low 100s.  I consulted cardiology and spoke to Dr. Alfred Levins.  He recommended to give the patient another 10 mg of diltiazem.  He reported that if she is not rate controlled at that point, he would admit for observation overnight.  Another 10 mg of diltiazem ordered.  On reevaluation, patient is in the mid 7s for heart rate.  Blood pressure normal.  Patient reports that she is feeling better.  We will start her on Eliquis given that her CHA2DS2-VASc score is between 2 and 3 given that she was hypertensive today.  Placed ambulatory for follow-up for A-fib clinic.  I am unsure of why the patient's having urge incontinence.  Likely due to her age and having given birth before.  She is also mentioned that she has been staying well-hydrated.  Could be overfilled bladder resulting in a spasm.  She can follow-up with her primary care doctor.  Attending assessed at bedside and  agrees.  Additionally, will give her a few metoprolol tartrate pills to use if she feels palpitations are increased heart rate.  We discussed the use of blood thinners.  Roderic Palau, pharmacist, discussed Eliquis with him at bedside.  We discussed risk and benefits.  Patient would like to continue with Eliquis use.  Ambulatory referral for A-fib clinic placed.  We discussed return precautions and red flag symptoms.  Patient verbalized understanding and agrees to the plan.  Patient is stable being discharged home in good condition.  I discussed this case with my attending physician who cosigned this note including patient's presenting symptoms, physical exam, and planned diagnostics and interventions. Attending physician stated agreement with plan or made changes to plan which were implemented.   Attending physician assessed patient at bedside.  Final Clinical Impression(s) / ED Diagnoses Final diagnoses:  New onset atrial fibrillation [facility])    Rx / DC Orders ED Discharge Orders          Ordered    Amb Referral to AFIB Clinic        01/25/22 2050    apixaban (ELIQUIS) 5 MG TABS tablet  2 times daily        01/25/22 2108              Sherrell Puller, PA-C 01/25/22 2345    Noemi Chapel, MD 01/27/22 1105

## 2022-01-29 DIAGNOSIS — R059 Cough, unspecified: Secondary | ICD-10-CM | POA: Diagnosis not present

## 2022-01-29 DIAGNOSIS — J029 Acute pharyngitis, unspecified: Secondary | ICD-10-CM | POA: Diagnosis not present

## 2022-01-29 DIAGNOSIS — R519 Headache, unspecified: Secondary | ICD-10-CM | POA: Diagnosis not present

## 2022-01-29 DIAGNOSIS — R509 Fever, unspecified: Secondary | ICD-10-CM | POA: Diagnosis not present

## 2022-02-07 DIAGNOSIS — R109 Unspecified abdominal pain: Secondary | ICD-10-CM | POA: Diagnosis not present

## 2022-02-17 ENCOUNTER — Encounter (HOSPITAL_COMMUNITY): Payer: Self-pay | Admitting: Physician Assistant

## 2022-02-17 ENCOUNTER — Ambulatory Visit (HOSPITAL_COMMUNITY)
Admission: RE | Admit: 2022-02-17 | Discharge: 2022-02-17 | Disposition: A | Discharge status: Home / Self Care | Payer: Medicare Other | Source: Ambulatory Visit | Attending: Physician Assistant | Admitting: Physician Assistant

## 2022-02-17 VITALS — BP 140/80 | HR 58 | Wt 229.0 lb

## 2022-02-17 DIAGNOSIS — R0683 Snoring: Secondary | ICD-10-CM | POA: Insufficient documentation

## 2022-02-17 DIAGNOSIS — I480 Paroxysmal atrial fibrillation: Secondary | ICD-10-CM

## 2022-02-17 DIAGNOSIS — I4891 Unspecified atrial fibrillation: Secondary | ICD-10-CM | POA: Insufficient documentation

## 2022-02-17 MED ORDER — APIXABAN 5 MG PO TABS: 5.0000 mg | ORAL_TABLET | Freq: Two times a day (BID) | ORAL | 3 refills | Status: DC

## 2022-02-17 NOTE — Progress Notes (Addendum)
Primary Care Physician: Lurline Del, DO Referring Physician: [facility] ED    Dawn Haley is a 66 y.o. female without any significant health issues that presented to [facility] ED with symptoms of heart racing starting that night while making dinner. Po showed HR upt to 155 bpm. Ekg showed afib. She had taken claritin-D that am. She left the ED in SR after  IV cardizem. She was started on eliquis 5 mg bid for a CHA2DS2VASc  score of 2-3.   In the afib clinic today, she has been in [place]. She states no further afib. Of note, she tested positive for covid 2-3 days after  ED visit, but did not have any issues with afib at that time. She does snore and is pending a sleep study with PCP. She was consuming 1-2 glasses of wine several times a week but has now eliminated alcohol. Low caffeine use. No regular exercise program with a BMI of 32.39.   Today, she denies symptoms of palpitations, chest pain, shortness of breath, orthopnea, PND, lower extremity edema, dizziness, presyncope, syncope, or neurologic sequela. The patient is tolerating medications without difficulties and is otherwise without complaint today.   Past Medical History:  Diagnosis Date   Diverticulitis    Dyspnea    walking up hill   Headache 11/01/2019   Past Surgical History:  Procedure Laterality Date   ABDOMINAL HYSTERECTOMY     APPENDECTOMY     CYST EXCISION Right 11/02/2015   Procedure: RIGHT INDEX EXCISION MASS ;  Surgeon: Leanora Cover, MD;  Location: [place];  Service: Orthopedics;  Laterality: Right;   CYSTOSCOPY Bilateral 11/09/2019   Procedure: CYSTOSCOPY WITH BILATERAL FIREFLY  INJECTION;  Surgeon: Ceasar Mons, MD;  Location: [facility];  Service: Urology;  Laterality: Bilateral;   DIAGNOSTIC LAPAROSCOPY     TONSILLECTOMY     URETHRAL DILATION      Current Outpatient Medications  Medication Sig Dispense Refill   apixaban (ELIQUIS) 5 MG TABS tablet Take 1 tablet (5 mg total) by mouth 2 (two) times  daily. 60 tablet 0   ASHWAGANDHA PO Take 800 mg by mouth in the morning and at bedtime.      CHELATED MAGNESIUM PO Take 200 mg by mouth daily.     Cholecalciferol (VITAMIN D) 125 MCG (5000 UT) CAPS Take 5,000 Units by mouth daily.      fluticasone (FLONASE) 50 MCG/ACT nasal spray Place 1 spray into both nostrils daily as needed for allergies.      GARLIC PO Take 1 capsule by mouth daily.      loratadine-pseudoephedrine (CLARITIN-D 12-HOUR) 5-120 MG tablet Take 1 tablet by mouth daily.     LORazepam (ATIVAN) 0.5 MG tablet Take 0.5 mg by mouth at bedtime as needed for sleep.   0   metoprolol tartrate (LOPRESSOR) 25 MG tablet Take 1 tablet (25 mg total) by mouth 2 (two) times daily as needed. 30 tablet 0   Multiple Vitamin (MULTIVITAMIN WITH MINERALS) TABS tablet Take 1 tablet by mouth daily.     Omega-3 Fatty Acids (FISH OIL) 1200 MG CAPS Take 1,200 mg by mouth daily.      OVER THE COUNTER MEDICATION Take 2 capsules by mouth daily. Adrenal Support Blend     Probiotic Product (PROBIOTIC PO) Take 1 capsule by mouth daily.      traMADol (ULTRAM) 50 MG tablet Take 1 tablet (50 mg total) by mouth every 6 (six) hours as needed (mild pain). 30 tablet 0  Ubiquinol 100 MG CAPS Take 100 mg by mouth daily.      No current facility-administered medications for this encounter.    Allergies  Allergen Reactions   Codeine Hives, Rash and Other (See Comments)    Intensifies pain instead of relieving   Adhesive [Tape] Rash   Other Other (See Comments)    Certain cholesterol medications give her cramps, she can't remember which ones   Pentothal [Thiopental] Rash    Social History   Socioeconomic History   Marital status: Married    Spouse name: Not on file   Number of children: Not on file   Years of education: Not on file   Highest education level: Not on file  Occupational History   Not on file  Tobacco Use   Smoking status: Never   Smokeless tobacco: Never  Vaping Use   Vaping Use: Never  used  Substance and Sexual Activity   Alcohol use: Yes    Alcohol/week: 4.0 standard drinks of alcohol    Types: 4 Glasses of wine per week   Drug use: Never   Sexual activity: Not Currently    Comment: 1st intercourse 14 yo-5 partners  Other Topics Concern   Not on file  Social History Narrative   Not on file   Social Determinants of Health   Financial Resource Strain: Not on file  Food Insecurity: Not on file  Transportation Needs: Not on file  Physical Activity: Not on file  Stress: Not on file  Social Connections: Not on file  Intimate Partner Violence: Not on file    Family History  Problem Relation Age of Onset   Heart disease Father    Stroke Father    Diabetes Father    Alzheimer's disease Mother     ROS- All systems are reviewed and negative except as per the HPI above  Physical Exam: There were no vitals filed for this visit. Wt Readings from Last 3 Encounters:  01/25/22 98.2 kg  11/09/19 98.2 kg  11/03/19 98.2 kg    Labs: Lab Results  Component Value Date   NA 138 01/25/2022   K 3.7 01/25/2022   CL 105 01/25/2022   CO2 22 01/25/2022   GLUCOSE 117 (H) 01/25/2022   BUN 14 01/25/2022   CREATININE 1.11 (H) 01/25/2022   CALCIUM 9.5 01/25/2022   MG 2.0 01/25/2022   No results found for: "INR" No results found for: "CHOL", "HDL", "[place]", "TRIG"   GEN- The patient is well appearing, alert and oriented x 3 today.   Head- normocephalic, atraumatic Eyes-  Sclera clear, conjunctiva pink Ears- hearing intact Oropharynx- clear Neck- supple, no JVP Lymph- no cervical lymphadenopathy Lungs- Clear to ausculation bilaterally, normal work of breathing Heart- Regular rate and rhythm, no murmurs, rubs or gallops, PMI not laterally displaced GI- soft, NT, ND, + BS Extremities- no clubbing, cyanosis, or edema MS- no significant deformity or atrophy Skin- no rash or lesion Psych- euthymic mood, full affect Neuro- strength and sensation are  intact  EKG-Vent. rate 58 BPM PR interval 116 ms QRS duration 86 ms QT/QTcB 416/408 ms P-R-T axes 68 67 76 Sinus bradycardia Otherwise normal ECG When compared with ECG of 25-Jan-2022 16:48, PREVIOUS ECG IS PRESENT    Assessment and Plan:  1. New onset Afib Now in SR  No return of afib since ED visit  General educations re afib and triggers, how to treat subsequent events Echo ordered Has metoprolol to use as needed   2. CHA2DS2VASc  score of 2-3 May not need long term Continue eliquis 5 mg bid for now  Bleeding precautions discussed    3. BMI of 32.39 Regular exercise and weight loss discussed   4. Snoring Sleep study is pending   Return in one month   Butch Penny C. Eldrige Pitkin, [facility] [address] [place], [place] [address] [phone]

## 2022-02-21 ENCOUNTER — Ambulatory Visit (HOSPITAL_COMMUNITY)
Admission: RE | Admit: 2022-02-21 | Discharge: 2022-02-21 | Disposition: A | Discharge status: Home / Self Care | Payer: Medicare Other | Source: Ambulatory Visit | Attending: Family Medicine | Admitting: Family Medicine

## 2022-02-21 DIAGNOSIS — I480 Paroxysmal atrial fibrillation: Secondary | ICD-10-CM | POA: Diagnosis not present

## 2022-02-21 LAB — ECHOCARDIOGRAM COMPLETE
Area-P 1/2: 2.79 cm2
S' Lateral: 3.1 cm

## 2022-02-21 NOTE — Progress Notes (Signed)
  Echocardiogram 2D Echocardiogram has been performed.  [name] M 02/21/2022, 8:27 AM

## 2022-02-25 DIAGNOSIS — G4719 Other hypersomnia: Secondary | ICD-10-CM | POA: Diagnosis not present

## 2022-02-25 DIAGNOSIS — Z6833 Body mass index (BMI) 33.0-33.9, adult: Secondary | ICD-10-CM | POA: Diagnosis not present

## 2022-02-25 DIAGNOSIS — Z79899 Other long term (current) drug therapy: Secondary | ICD-10-CM | POA: Diagnosis not present

## 2022-02-25 DIAGNOSIS — I4891 Unspecified atrial fibrillation: Secondary | ICD-10-CM | POA: Diagnosis not present

## 2022-03-26 ENCOUNTER — Ambulatory Visit (HOSPITAL_COMMUNITY)
Admission: RE | Admit: 2022-03-26 | Discharge: 2022-03-26 | Disposition: A | Discharge status: Home / Self Care | Payer: Medicare Other | Source: Ambulatory Visit | Attending: Nurse Practitioner | Admitting: Nurse Practitioner

## 2022-03-26 ENCOUNTER — Encounter (HOSPITAL_COMMUNITY): Payer: Self-pay | Admitting: Nurse Practitioner

## 2022-03-26 VITALS — BP 122/70 | HR 61 | Ht 70.5 in | Wt 229.6 lb

## 2022-03-26 DIAGNOSIS — I480 Paroxysmal atrial fibrillation: Secondary | ICD-10-CM

## 2022-03-26 DIAGNOSIS — Z8616 Personal history of COVID-19: Secondary | ICD-10-CM | POA: Diagnosis not present

## 2022-03-26 DIAGNOSIS — R0683 Snoring: Secondary | ICD-10-CM | POA: Insufficient documentation

## 2022-03-26 LAB — CBC
HCT: 42.6 % (ref 36.0–46.0)
Hemoglobin: 14.5 g/dL (ref 12.0–15.0)
MCH: 30.8 pg (ref 26.0–34.0)
MCHC: 34 g/dL (ref 30.0–36.0)
MCV: 90.4 fL (ref 80.0–100.0)
Platelets: 246 10*3/uL (ref 150–400)
RBC: 4.71 MIL/uL (ref 3.87–5.11)
RDW: 12.8 % (ref 11.5–15.5)
WBC: 5.3 10*3/uL (ref 4.0–10.5)
nRBC: 0 % (ref 0.0–0.2)

## 2022-03-26 LAB — BASIC METABOLIC PANEL
Anion gap: 7 (ref 5–15)
BUN: 9 mg/dL (ref 8–23)
CO2: 26 mmol/L (ref 22–32)
Calcium: 9.3 mg/dL (ref 8.9–10.3)
Chloride: 106 mmol/L (ref 98–111)
Creatinine, Ser: 0.96 mg/dL (ref 0.44–1.00)
GFR, Estimated: >60 mL/min (ref >60)
Glucose, Bld: 108 mg/dL — ABNORMAL HIGH (ref 70–99)
Potassium: 4.4 mmol/L (ref 3.5–5.1)
Sodium: 139 mmol/L (ref 135–145)

## 2022-03-26 NOTE — Progress Notes (Signed)
Primary Care Physician: Lurline Del, DO Referring Physician: [facility] ED    Dawn Haley is a 66 y.o. female without any significant health issues that presented to [facility] ED with symptoms of heart racing starting that night while making dinner. Po showed HR upt to 155 bpm. Ekg showed afib. She had taken claritin-D that am. She left the ED in SR after  IV cardizem. She was started on eliquis 5 mg bid for a CHA2DS2VASc  score of 2-3.   In the afib clinic today, she has been in [place]. She states no further afib. Of note, she tested positive for covid 2-3 days after  ED visit, but did not have any issues with afib at that time. She does snore and is pending a sleep study with PCP. She was consuming 1-2 glasses of wine several times a week but has now eliminated alcohol. Low caffeine use. No regular exercise program with a BMI of 32.39.   F/u in afib clinic, 03/26/22. She is in SR. No further afib. Echo reviewed and normal. We discussed with a CHA2DS2VASc  score of 2, (female, age)she can stop eliquis but keep remainder on hand if she has an extended episode of afib.   Today, she denies symptoms of palpitations, chest pain, shortness of breath, orthopnea, PND, lower extremity edema, dizziness, presyncope, syncope, or neurologic sequela. The patient is tolerating medications without difficulties and is otherwise without complaint today.   Past Medical History:  Diagnosis Date   Diverticulitis    Dyspnea    walking up hill   Headache 11/01/2019   Past Surgical History:  Procedure Laterality Date   ABDOMINAL HYSTERECTOMY     APPENDECTOMY     CYST EXCISION Right 11/02/2015   Procedure: RIGHT INDEX EXCISION MASS ;  Surgeon: Leanora Cover, MD;  Location: [place];  Service: Orthopedics;  Laterality: Right;   CYSTOSCOPY Bilateral 11/09/2019   Procedure: CYSTOSCOPY WITH BILATERAL FIREFLY  INJECTION;  Surgeon: Ceasar Mons, MD;  Location[facility];  Service: Urology;  Laterality:  Bilateral;   DIAGNOSTIC LAPAROSCOPY     TONSILLECTOMY     URETHRAL DILATION      Current Outpatient Medications  Medication Sig Dispense Refill   apixaban (ELIQUIS) 5 MG TABS tablet Take 1 tablet (5 mg total) by mouth 2 (two) times daily. 60 tablet 3   ASHWAGANDHA PO Take 1,000 mg by mouth every morning.     aspirin-acetaminophen-caffeine (EXCEDRIN MIGRAINE) 250-250-65 MG tablet Take 2 tablets by mouth as needed for headache.     CHELATED MAGNESIUM PO Take 200 mg by mouth daily.     Cholecalciferol (VITAMIN D) 125 MCG (5000 UT) CAPS Take 5,000 Units by mouth daily.      fluticasone (FLONASE) 50 MCG/ACT nasal spray Place 1 spray into both nostrils daily as needed for allergies.      GARLIC PO Take 1 capsule by mouth daily.      LORazepam (ATIVAN) 0.5 MG tablet Take 0.5 mg by mouth at bedtime as needed for sleep.   0   metoprolol tartrate (LOPRESSOR) 25 MG tablet Take 1 tablet (25 mg total) by mouth 2 (two) times daily as needed. 30 tablet 0   Multiple Vitamin (MULTIVITAMIN WITH MINERALS) TABS tablet Take 1 tablet by mouth daily.     Omega-3 Fatty Acids (FISH OIL) 1200 MG CAPS Take 1,200 mg by mouth daily.      OVER THE COUNTER MEDICATION Take 2 capsules by mouth daily. Adrenal Support Blend  Probiotic Product (PROBIOTIC PO) Take 1 capsule by mouth daily.      Ubiquinol 100 MG CAPS Take 100 mg by mouth daily.      No current facility-administered medications for this encounter.    Allergies  Allergen Reactions   Codeine Hives, Rash and Other (See Comments)    Intensifies pain instead of relieving   Adhesive [Tape] Rash   Other Other (See Comments)    Certain cholesterol medications give her cramps, she can't remember which ones   Pentothal [Thiopental] Rash    Social History   Socioeconomic History   Marital status: Married    Spouse name: Not on file   Number of children: Not on file   Years of education: Not on file   Highest education level: Not on file  Occupational  History   Not on file  Tobacco Use   Smoking status: Never   Smokeless tobacco: Never  Vaping Use   Vaping Use: Never used  Substance and Sexual Activity   Alcohol use: Yes    Alcohol/week: 4.0 standard drinks of alcohol    Types: 4 Glasses of wine per week   Drug use: Never   Sexual activity: Not Currently    Comment: 1st intercourse 30 yo-5 partners  Other Topics Concern   Not on file  Social History Narrative   Not on file   Social Determinants of Health   Financial Resource Strain: Not on file  Food Insecurity: Not on file  Transportation Needs: Not on file  Physical Activity: Not on file  Stress: Not on file  Social Connections: Not on file  Intimate Partner Violence: Not on file    Family History  Problem Relation Age of Onset   Heart disease Father    Stroke Father    Diabetes Father    Alzheimer's disease Mother     ROS- All systems are reviewed and negative except as per the HPI above  Physical Exam: Vitals:   03/26/22 0847  BP: 122/70  Pulse: 61  Weight: 104.1 kg  Height: 5' 10.5" (1.791 m)   Wt Readings from Last 3 Encounters:  03/26/22 104.1 kg  02/17/22 103.9 kg  01/25/22 98.2 kg    Labs: Lab Results  Component Value Date   NA 138 01/25/2022   K 3.7 01/25/2022   CL 105 01/25/2022   CO2 22 01/25/2022   GLUCOSE 117 (H) 01/25/2022   BUN 14 01/25/2022   CREATININE 1.11 (H) 01/25/2022   CALCIUM 9.5 01/25/2022   MG 2.0 01/25/2022   No results found for: "INR" No results found for: "CHOL", "HDL", "LDLCALC", "TRIG"   GEN- The patient is well appearing, alert and oriented x 3 today.   Head- normocephalic, atraumatic Eyes-  Sclera clear, conjunctiva pink Ears- hearing intact Oropharynx- clear Neck- supple, no JVP Lymph- no cervical lymphadenopathy Lungs- Clear to ausculation bilaterally, normal work of breathing Heart- Regular rate and rhythm, no murmurs, rubs or gallops, PMI not laterally displaced GI- soft, NT, ND, +  BS Extremities- no clubbing, cyanosis, or edema MS- no significant deformity or atrophy Skin- no rash or lesion Psych- euthymic mood, full affect Neuro- strength and sensation are intact  EKG-Vent. rate 61 BPM PR interval 122 ms QRS duration 86 ms QT/QTcB 394/396 ms P-R-T axes 69 70 81 Normal sinus rhythm Normal ECG When compared with ECG of 17-Feb-2022 09:22, PREVIOUS ECG IS PRESENT  Echo- 1. Left ventricular ejection fraction, by estimation, is 60 to 65%. The  left ventricle  has normal function. The left ventricle has no regional  wall motion abnormalities. Left ventricular diastolic parameters were  normal.   2. Right ventricular systolic function is normal. The right ventricular  size is normal. Tricuspid regurgitation signal is inadequate for assessing  PA pressure.   3. The mitral valve is normal in structure. No evidence of mitral valve  regurgitation.   4. The aortic valve is tricuspid. Aortic valve regurgitation is not  visualized. No aortic stenosis is present.   Assessment and Plan:  1. New onset Afib Maintaining  SR  No return of afib since ED visit  General educations re afib and triggers, how to treat subsequent events Echo reviewed with pt  Has metoprolol to use as needed   2. CHA2DS2VASc  score of 2 Discussed guidelines with pt and per guidelines at this point she  does not need daily anticoagulation  Stop eliquis 5 mg bid  She can restart if prolonged episode of afib and contact the office   3. BMI of 32.39 Regular exercise and weight loss discussed   4. Snoring Sleep study is pending in December   Return in 6 months  Butch Penny C. Noa Constante, [facility] [address] [place], [place] [address] [phone]

## 2022-03-27 ENCOUNTER — Ambulatory Visit (HOSPITAL_COMMUNITY): Payer: Medicare Other | Admitting: Nurse Practitioner

## 2022-03-31 ENCOUNTER — Telehealth (HOSPITAL_COMMUNITY): Payer: Self-pay | Admitting: *Deleted

## 2022-03-31 NOTE — Telephone Encounter (Signed)
Pt went into afib at 8pm last night stayed in afib until about 8am this morning. Pt took PRN metoprolol twice and also an eliquis. Educated pt on use of PRN metoprolol and per Jorja Loa PA pt would only resume Eliquis should she have more than 24 hours of persistent afib. Pt verbalized understanding. Pt will call if burden increases for AAD discussion.

## 2022-03-31 NOTE — Telephone Encounter (Signed)
duplicate

## 2022-05-20 DIAGNOSIS — G4733 Obstructive sleep apnea (adult) (pediatric): Secondary | ICD-10-CM | POA: Diagnosis not present

## 2022-05-20 DIAGNOSIS — E669 Obesity, unspecified: Secondary | ICD-10-CM | POA: Diagnosis not present

## 2022-05-22 DIAGNOSIS — G4733 Obstructive sleep apnea (adult) (pediatric): Secondary | ICD-10-CM | POA: Diagnosis not present

## 2022-06-03 DIAGNOSIS — G4733 Obstructive sleep apnea (adult) (pediatric): Secondary | ICD-10-CM | POA: Diagnosis not present

## 2022-06-19 ENCOUNTER — Encounter (HOSPITAL_COMMUNITY): Payer: Self-pay | Admitting: *Deleted

## 2022-06-27 DIAGNOSIS — G4733 Obstructive sleep apnea (adult) (pediatric): Secondary | ICD-10-CM | POA: Diagnosis not present

## 2022-06-27 DIAGNOSIS — R21 Rash and other nonspecific skin eruption: Secondary | ICD-10-CM | POA: Diagnosis not present

## 2022-06-27 DIAGNOSIS — Z9989 Dependence on other enabling machines and devices: Secondary | ICD-10-CM | POA: Diagnosis not present

## 2022-06-27 DIAGNOSIS — R42 Dizziness and giddiness: Secondary | ICD-10-CM | POA: Diagnosis not present

## 2022-07-04 DIAGNOSIS — G4733 Obstructive sleep apnea (adult) (pediatric): Secondary | ICD-10-CM | POA: Diagnosis not present

## 2022-07-10 DIAGNOSIS — R053 Chronic cough: Secondary | ICD-10-CM | POA: Diagnosis not present

## 2022-07-10 DIAGNOSIS — Z03818 Encounter for observation for suspected exposure to other biological agents ruled out: Secondary | ICD-10-CM | POA: Diagnosis not present

## 2022-07-10 DIAGNOSIS — R0981 Nasal congestion: Secondary | ICD-10-CM | POA: Diagnosis not present

## 2022-07-16 ENCOUNTER — Other Ambulatory Visit: Payer: Self-pay | Admitting: Family Medicine

## 2022-07-16 ENCOUNTER — Ambulatory Visit
Admission: RE | Admit: 2022-07-16 | Discharge: 2022-07-16 | Disposition: A | Discharge status: Home / Self Care | Payer: Medicare Other | Source: Ambulatory Visit | Attending: Family Medicine | Admitting: Family Medicine

## 2022-07-16 DIAGNOSIS — R42 Dizziness and giddiness: Secondary | ICD-10-CM | POA: Diagnosis not present

## 2022-07-16 DIAGNOSIS — R059 Cough, unspecified: Secondary | ICD-10-CM

## 2022-07-16 DIAGNOSIS — B370 Candidal stomatitis: Secondary | ICD-10-CM | POA: Diagnosis not present

## 2022-07-16 DIAGNOSIS — R0989 Other specified symptoms and signs involving the circulatory and respiratory systems: Secondary | ICD-10-CM | POA: Diagnosis not present

## 2022-07-16 DIAGNOSIS — R0981 Nasal congestion: Secondary | ICD-10-CM | POA: Diagnosis not present

## 2022-07-16 DIAGNOSIS — R5381 Other malaise: Secondary | ICD-10-CM | POA: Diagnosis not present

## 2022-07-22 DIAGNOSIS — H6121 Impacted cerumen, right ear: Secondary | ICD-10-CM | POA: Diagnosis not present

## 2022-07-22 DIAGNOSIS — B370 Candidal stomatitis: Secondary | ICD-10-CM | POA: Diagnosis not present

## 2022-07-22 DIAGNOSIS — J018 Other acute sinusitis: Secondary | ICD-10-CM | POA: Diagnosis not present

## 2022-07-29 DIAGNOSIS — G4733 Obstructive sleep apnea (adult) (pediatric): Secondary | ICD-10-CM | POA: Diagnosis not present

## 2022-08-01 DIAGNOSIS — E785 Hyperlipidemia, unspecified: Secondary | ICD-10-CM | POA: Diagnosis not present

## 2022-08-01 DIAGNOSIS — I700 Atherosclerosis of aorta: Secondary | ICD-10-CM | POA: Diagnosis not present

## 2022-08-01 DIAGNOSIS — R5383 Other fatigue: Secondary | ICD-10-CM | POA: Diagnosis not present

## 2022-08-02 DIAGNOSIS — G4733 Obstructive sleep apnea (adult) (pediatric): Secondary | ICD-10-CM | POA: Diagnosis not present

## 2022-08-19 DIAGNOSIS — J3489 Other specified disorders of nose and nasal sinuses: Secondary | ICD-10-CM | POA: Diagnosis not present

## 2022-08-19 DIAGNOSIS — R42 Dizziness and giddiness: Secondary | ICD-10-CM | POA: Diagnosis not present

## 2022-08-21 DIAGNOSIS — E785 Hyperlipidemia, unspecified: Secondary | ICD-10-CM | POA: Diagnosis not present

## 2022-08-21 DIAGNOSIS — I700 Atherosclerosis of aorta: Secondary | ICD-10-CM | POA: Diagnosis not present

## 2022-08-21 DIAGNOSIS — R42 Dizziness and giddiness: Secondary | ICD-10-CM | POA: Diagnosis not present

## 2022-08-21 DIAGNOSIS — H903 Sensorineural hearing loss, bilateral: Secondary | ICD-10-CM | POA: Diagnosis not present

## 2022-08-25 ENCOUNTER — Other Ambulatory Visit: Payer: Self-pay | Admitting: Otolaryngology

## 2022-08-25 DIAGNOSIS — H903 Sensorineural hearing loss, bilateral: Secondary | ICD-10-CM

## 2022-09-02 DIAGNOSIS — G4733 Obstructive sleep apnea (adult) (pediatric): Secondary | ICD-10-CM | POA: Diagnosis not present

## 2022-09-12 ENCOUNTER — Other Ambulatory Visit: Payer: Self-pay | Admitting: Otolaryngology

## 2022-09-12 DIAGNOSIS — H903 Sensorineural hearing loss, bilateral: Secondary | ICD-10-CM

## 2022-09-24 ENCOUNTER — Ambulatory Visit
Admission: RE | Admit: 2022-09-24 | Discharge: 2022-09-24 | Disposition: A | Discharge status: Home / Self Care | Payer: Medicare Other | Source: Ambulatory Visit | Attending: Otolaryngology | Admitting: Otolaryngology

## 2022-09-24 DIAGNOSIS — H903 Sensorineural hearing loss, bilateral: Secondary | ICD-10-CM

## 2022-09-24 DIAGNOSIS — H905 Unspecified sensorineural hearing loss: Secondary | ICD-10-CM | POA: Diagnosis not present

## 2022-09-24 MED ORDER — IOPAMIDOL (ISOVUE-300) INJECTION 61%
100.0000 mL | Freq: Once | INTRAVENOUS | Status: AC | PRN
  Administered 2022-09-24: 75 mL via INTRAVENOUS

## 2022-09-29 ENCOUNTER — Other Ambulatory Visit: Payer: Medicare Other

## 2022-10-14 DIAGNOSIS — L659 Nonscarring hair loss, unspecified: Secondary | ICD-10-CM | POA: Diagnosis not present

## 2022-10-16 ENCOUNTER — Other Ambulatory Visit: Payer: Medicare Other

## 2022-12-01 DIAGNOSIS — M25562 Pain in left knee: Secondary | ICD-10-CM | POA: Diagnosis not present

## 2022-12-01 DIAGNOSIS — M25551 Pain in right hip: Secondary | ICD-10-CM | POA: Diagnosis not present

## 2022-12-01 DIAGNOSIS — M25561 Pain in right knee: Secondary | ICD-10-CM | POA: Diagnosis not present

## 2022-12-02 DIAGNOSIS — L600 Ingrowing nail: Secondary | ICD-10-CM | POA: Diagnosis not present

## 2022-12-02 DIAGNOSIS — L03032 Cellulitis of left toe: Secondary | ICD-10-CM | POA: Diagnosis not present

## 2022-12-16 DIAGNOSIS — L600 Ingrowing nail: Secondary | ICD-10-CM | POA: Diagnosis not present

## 2023-01-05 DIAGNOSIS — M25562 Pain in left knee: Secondary | ICD-10-CM | POA: Diagnosis not present

## 2023-01-06 DIAGNOSIS — S90212A Contusion of left great toe with damage to nail, initial encounter: Secondary | ICD-10-CM | POA: Diagnosis not present

## 2023-01-06 DIAGNOSIS — L600 Ingrowing nail: Secondary | ICD-10-CM | POA: Diagnosis not present

## 2023-01-09 DIAGNOSIS — M25562 Pain in left knee: Secondary | ICD-10-CM | POA: Diagnosis not present

## 2023-01-27 DIAGNOSIS — M898X7 Other specified disorders of bone, ankle and foot: Secondary | ICD-10-CM | POA: Diagnosis not present

## 2023-02-04 DIAGNOSIS — E559 Vitamin D deficiency, unspecified: Secondary | ICD-10-CM | POA: Diagnosis not present

## 2023-02-04 DIAGNOSIS — Z0000 Encounter for general adult medical examination without abnormal findings: Secondary | ICD-10-CM | POA: Diagnosis not present

## 2023-02-04 DIAGNOSIS — I700 Atherosclerosis of aorta: Secondary | ICD-10-CM | POA: Diagnosis not present

## 2023-02-04 DIAGNOSIS — E785 Hyperlipidemia, unspecified: Secondary | ICD-10-CM | POA: Diagnosis not present

## 2023-02-04 DIAGNOSIS — G4700 Insomnia, unspecified: Secondary | ICD-10-CM | POA: Diagnosis not present

## 2023-02-15 ENCOUNTER — Emergency Department (HOSPITAL_COMMUNITY)
Admission: EM | Admit: 2023-02-15 | Discharge: 2023-02-16 | Disposition: A | Discharge status: Home / Self Care | Payer: Medicare Other | Attending: Emergency Medicine | Admitting: Emergency Medicine

## 2023-02-15 ENCOUNTER — Encounter (HOSPITAL_COMMUNITY): Payer: Self-pay | Admitting: Emergency Medicine

## 2023-02-15 DIAGNOSIS — I4891 Unspecified atrial fibrillation: Secondary | ICD-10-CM | POA: Insufficient documentation

## 2023-02-15 DIAGNOSIS — Z7901 Long term (current) use of anticoagulants: Secondary | ICD-10-CM | POA: Diagnosis not present

## 2023-02-15 DIAGNOSIS — I499 Cardiac arrhythmia, unspecified: Secondary | ICD-10-CM | POA: Diagnosis not present

## 2023-02-15 DIAGNOSIS — I10 Essential (primary) hypertension: Secondary | ICD-10-CM | POA: Diagnosis not present

## 2023-02-15 DIAGNOSIS — R002 Palpitations: Secondary | ICD-10-CM | POA: Diagnosis not present

## 2023-02-15 DIAGNOSIS — R000 Tachycardia, unspecified: Secondary | ICD-10-CM | POA: Diagnosis not present

## 2023-02-15 NOTE — ED Triage Notes (Signed)
Pt here from home with c/o afib rvr hx of same , took 1 lopressor prior to ems arrival , no chest pain or sob

## 2023-02-16 LAB — BASIC METABOLIC PANEL
Anion gap: 9 (ref 5–15)
BUN: 17 mg/dL (ref 8–23)
CO2: 27 mmol/L (ref 22–32)
Calcium: 9 mg/dL (ref 8.9–10.3)
Chloride: 103 mmol/L (ref 98–111)
Creatinine, Ser: 0.95 mg/dL (ref 0.44–1.00)
GFR, Estimated: >60 mL/min (ref >60)
Glucose, Bld: 134 mg/dL — ABNORMAL HIGH (ref 70–99)
Potassium: 3.4 mmol/L — ABNORMAL LOW (ref 3.5–5.1)
Sodium: 139 mmol/L (ref 135–145)

## 2023-02-16 LAB — CBC WITH DIFFERENTIAL/PLATELET
Abs Immature Granulocytes: 0.02 10*3/uL (ref 0.00–0.07)
Basophils Absolute: 0.1 10*3/uL (ref 0.0–0.1)
Basophils Relative: 1 %
Eosinophils Absolute: 0.2 10*3/uL (ref 0.0–0.5)
Eosinophils Relative: 3 %
HCT: 45.1 % (ref 36.0–46.0)
Hemoglobin: 15.2 g/dL — ABNORMAL HIGH (ref 12.0–15.0)
Immature Granulocytes: 0 %
Lymphocytes Relative: 36 %
Lymphs Abs: 2.8 10*3/uL (ref 0.7–4.0)
MCH: 30.6 pg (ref 26.0–34.0)
MCHC: 33.7 g/dL (ref 30.0–36.0)
MCV: 90.9 fL (ref 80.0–100.0)
Monocytes Absolute: 0.6 10*3/uL (ref 0.1–1.0)
Monocytes Relative: 8 %
Neutro Abs: 4 10*3/uL (ref 1.7–7.7)
Neutrophils Relative %: 52 %
Platelets: 271 10*3/uL (ref 150–400)
RBC: 4.96 MIL/uL (ref 3.87–5.11)
RDW: 12.7 % (ref 11.5–15.5)
WBC: 7.7 10*3/uL (ref 4.0–10.5)
nRBC: 0 % (ref 0.0–0.2)

## 2023-02-16 LAB — MAGNESIUM: Magnesium: 2.2 mg/dL (ref 1.7–2.4)

## 2023-02-16 MED ORDER — METOPROLOL TARTRATE 5 MG/5ML IV SOLN
2.5000 mg | Freq: Once | INTRAVENOUS | Status: AC
  Administered 2023-02-16: 2.5 mg via INTRAVENOUS
  Filled 2023-02-16: qty 5

## 2023-02-16 MED ORDER — POTASSIUM CHLORIDE CRYS ER 20 MEQ PO TBCR
40.0000 meq | EXTENDED_RELEASE_TABLET | Freq: Once | ORAL | Status: AC
  Administered 2023-02-16: 40 meq via ORAL
  Filled 2023-02-16: qty 2

## 2023-02-16 MED ORDER — SODIUM CHLORIDE 0.9 % IV BOLUS
1000.0000 mL | Freq: Once | INTRAVENOUS | Status: AC
  Administered 2023-02-16: 1000 mL via INTRAVENOUS

## 2023-02-16 MED ORDER — APIXABAN 5 MG PO TABS
5.0000 mg | ORAL_TABLET | Freq: Once | ORAL | Status: AC
  Administered 2023-02-16: 5 mg via ORAL
  Filled 2023-02-16: qty 1

## 2023-02-16 MED ORDER — APIXABAN 5 MG PO TABS: 5.0000 mg | ORAL_TABLET | Freq: Two times a day (BID) | ORAL | 0 refills | Status: DC

## 2023-02-16 NOTE — Discharge Instructions (Signed)
Take the metoprolol regularly until you are seen by the cardiologist in the office.  Looks like the one you are prescribed is twice a day medication.  Your potassium here was a little bit low.  Typically they would the potassium a little bit higher if your heart is beating funny.  I would have you continue taking your potassium supplements at home and I would have you try to eat a food that is high in potassium with each meal at least until you are seen in the cardiology clinic.  Call the A-fib clinic first thing in the morning.  Please return for feeling like her heart is racing or feeling like you might pass out.  I did represcribe you a blood thinning medicine.  Please take this at least until you are seen by the cardiologist in the office.

## 2023-02-16 NOTE — ED Provider Notes (Signed)
[place] EMERGENCY DEPARTMENT AT [facility] Provider Note   CSN: [id number] Arrival date & time: 02/15/23  2354     History  Chief Complaint  Patient presents with   Irregular Heart Beat    Dawn Haley is a 67 y.o. female.  67 yo F with a chief complaints of palpitations.  This was noticed this evening when she was trying to go to sleep.  [place] she has been well recently no cough congestion fever no abdominal pain nausea vomiting or diarrhea.  No recent medication changes.  She had a history of atrial fibrillation that resolved on its own.  Was on Eliquis for a short time and was taken off.  Took a dose of metoprolol at home.          Home Medications Prior to Admission medications   Medication Sig Start Date End Date Taking? Authorizing Provider  apixaban (ELIQUIS) 5 MG TABS tablet Take 1 tablet (5 mg total) by mouth 2 (two) times daily. 02/16/23 03/18/23 Yes Melene Plan, DO  ASHWAGANDHA PO Take 1,000 mg by mouth every morning.    [provider]  aspirin-acetaminophen-caffeine (EXCEDRIN MIGRAINE) [phone] MG tablet Take 2 tablets by mouth as needed for headache.    [provider]  CHELATED MAGNESIUM PO Take 200 mg by mouth daily.    [provider]  Cholecalciferol (VITAMIN D) 125 MCG (5000 UT) CAPS Take 5,000 Units by mouth daily.     [provider]  fluticasone (FLONASE) 50 MCG/ACT nasal spray Place 1 spray into both nostrils daily as needed for allergies.     [provider]  GARLIC PO Take 1 capsule by mouth daily.     [provider]  LORazepam (ATIVAN) 0.5 MG tablet Take 0.5 mg by mouth at bedtime as needed for sleep.  05/30/15   [provider]  metoprolol tartrate (LOPRESSOR) 25 MG tablet Take 1 tablet (25 mg total) by mouth 2 (two) times daily as needed. 01/25/22   Achille Rich, PA-C  Multiple Vitamin (MULTIVITAMIN WITH MINERALS) TABS tablet Take 1 tablet by mouth daily.    [provider]  Omega-3 Fatty Acids (FISH OIL) 1200 MG CAPS Take 1,200 mg by mouth daily.     [provider]  OVER THE COUNTER MEDICATION Take 2 capsules by mouth daily. Adrenal Support Blend    [provider]  Probiotic Product (PROBIOTIC PO) Take 1 capsule by mouth daily.     [provider]  Ubiquinol 100 MG CAPS Take 100 mg by mouth daily.     [provider]      Allergies    Codeine, Adhesive [tape], Other, and Pentothal [thiopental]    Review of Systems   Review of Systems  Physical Exam Updated Vital Signs BP 120/78 (BP Location: Left Arm)   Pulse 86   Resp 13   SpO2 96%  Physical Exam Vitals and nursing note reviewed.  Constitutional:      General: She is not in acute distress.    Appearance: She is well-developed. She is not diaphoretic.  HENT:     Head: Normocephalic and atraumatic.  Eyes:     Pupils: Pupils are equal, round, and reactive to light.  Cardiovascular:     Rate and Rhythm: Tachycardia present. Rhythm irregular.     Heart sounds: No murmur heard.    No friction rub. No gallop.  Pulmonary:     Effort: Pulmonary effort is normal.  Breath sounds: No wheezing or rales.  Abdominal:     General: There is no distension.     Palpations: Abdomen is soft.     Tenderness: There is no abdominal tenderness.  Musculoskeletal:        General: No tenderness.     Cervical back: Normal range of motion and neck supple.  Skin:    General: Skin is warm and dry.  Neurological:     Mental Status: She is alert and oriented to person, place, and time.  Psychiatric:        Behavior: Behavior normal.     ED Results / Procedures / Treatments   Labs (all labs ordered are listed, but only abnormal results are displayed) Labs Reviewed  CBC WITH DIFFERENTIAL/PLATELET - Abnormal; Notable for the following components:      Result Value   Hemoglobin 15.2 (*)    All other components within normal limits  BASIC METABOLIC PANEL -  Abnormal; Notable for the following components:   Potassium 3.4 (*)    Glucose, Bld 134 (*)    All other components within normal limits  MAGNESIUM    EKG EKG Interpretation Date/Time:  Monday February 16 2023 00:17:23 EDT Ventricular Rate:  102 PR Interval:    QRS Duration:  92 QT Interval:  366 QTC Calculation: 477 R Axis:   65  Text Interpretation: Atrial fibrillation Otherwise no significant change Confirmed by Melene Plan [phone]) on 02/16/2023 12:23:29 AM  Radiology No results found.  Procedures Procedures    Medications Ordered in ED Medications  potassium chloride SA (KLOR-CON M) CR tablet 40 mEq (has no administration in time range)  apixaban (ELIQUIS) tablet 5 mg (has no administration in time range)  sodium chloride 0.9 % bolus 1,000 mL (1,000 mLs Intravenous New Bag/Given 02/16/23 0026)  metoprolol tartrate (LOPRESSOR) injection 2.5 mg (2.5 mg Intravenous Given 02/16/23 0027)    ED Course/ Medical Decision Making/ A&P                                 Medical Decision Making Amount and/or Complexity of Data Reviewed Labs: ordered.  Risk Prescription drug management.   67 yo F with a chief complaints of palpitations.  Patient in atrial fibrillation.  Has a history of the same.  Discussed cardioversion which she is bit reluctant as last time she converted on her own within a few days.  Will attempt rate control.  Check electrolytes here.  Bolus of IV fluids for possible dehydration.  Reassess.  Patient is now rate controlled.  Heart rate mostly in the 80s.  Feeling better.  Her potassium is little bit on the low side at 3.4.  Magnesium is normal.  No acute anemia.  She tells me that she is already on potassium supplementation because she takes chlorthalidone.  I am not sure if this is something that the cardiology clinic would consider stopping.  Will have her continue her potassium supplements and have her  try to increase potassium in her diet.  1:04 AM:  I have  discussed the diagnosis/risks/treatment options with the patient.  Evaluation and diagnostic testing in the emergency department does not suggest an emergent condition requiring admission or immediate intervention beyond what has been performed at this time.  They will follow up with Cards. We also discussed returning to the ED immediately if new or worsening sx occur. We discussed the sx which are most concerning (e.g.,  sudden worsening pain, fever, inability to tolerate by mouth) that necessitate immediate return. Medications administered to the patient during their visit and any new prescriptions provided to the patient are listed below.  CHA2DS2/VAS Stroke Risk Points  Current as of 12 minutes ago     2 >= 2 Points: High Risk  1 to 1.99 Points: Medium Risk  0 Points: Low Risk    Last Change: N/A      Details    This score determines the patient's risk of having a stroke if the  patient has atrial fibrillation.       Points Metrics  0 Has Congestive Heart Failure:  No    Current as of 12 minutes ago  0 Has Vascular Disease:  No    Current as of 12 minutes ago  0 Has Hypertension:  No    Current as of 12 minutes ago  1 Age:  65    Current as of 12 minutes ago  0 Has Diabetes:  No    Current as of 12 minutes ago  0 Had Stroke:  No  Had TIA:  No  Had Thromboembolism:  No    Current as of 12 minutes ago  1 Female:  Yes    Current as of 12 minutes ago            Medications given during this visit Medications  potassium chloride SA (KLOR-CON M) CR tablet 40 mEq (has no administration in time range)  apixaban (ELIQUIS) tablet 5 mg (has no administration in time range)  sodium chloride 0.9 % bolus 1,000 mL (1,000 mLs Intravenous New Bag/Given 02/16/23 0026)  metoprolol tartrate (LOPRESSOR) injection 2.5 mg (2.5 mg Intravenous Given 02/16/23 0027)     The patient appears reasonably screen and/or stabilized for discharge and I doubt any other medical condition or other [facility]  requiring further screening, evaluation, or treatment in the ED at this time prior to discharge.          Final Clinical Impression(s) / ED Diagnoses Final diagnoses:  Atrial fibrillation with RVR (HCC)    Rx / DC Orders ED Discharge Orders          Ordered    apixaban (ELIQUIS) 5 MG TABS tablet  2 times daily        02/16/23 0100              Melene Plan, DO 02/16/23 0104

## 2023-02-17 ENCOUNTER — Ambulatory Visit (HOSPITAL_COMMUNITY)
Admission: RE | Admit: 2023-02-17 | Discharge: 2023-02-17 | Disposition: A | Discharge status: Home / Self Care | Payer: Medicare Other | Source: Ambulatory Visit | Attending: Internal Medicine | Admitting: Internal Medicine

## 2023-02-17 VITALS — BP 120/64 | HR 58 | Ht 70.5 in | Wt 222.2 lb

## 2023-02-17 DIAGNOSIS — I4891 Unspecified atrial fibrillation: Secondary | ICD-10-CM | POA: Diagnosis not present

## 2023-02-17 DIAGNOSIS — I480 Paroxysmal atrial fibrillation: Secondary | ICD-10-CM | POA: Diagnosis not present

## 2023-02-17 NOTE — Progress Notes (Signed)
Primary Care Physician: Jackelyn Poling, DO Referring Physician: [facility] ED    Dawn Haley is a 67 y.o. female without any significant health issues that presented to [facility] ED with symptoms of heart racing starting that night while making dinner. Po showed HR upt to 155 bpm. Ekg showed afib. She had taken claritin-D that am. She left the ED in SR after  IV cardizem. She was started on eliquis 5 mg bid for a CHA2DS2VASc  score of 2-3.   In the afib clinic today, she has been in SR. She states no further afib. Of note, she tested positive for covid 2-3 days after  ED visit, but did not have any issues with afib at that time. She does snore and is pending a sleep study with PCP. She was consuming 1-2 glasses of wine several times a week but has now eliminated alcohol. Low caffeine use. No regular exercise program with a BMI of 32.39.   F/u in afib clinic, 03/26/22. She is in SR. No further afib. Echo reviewed and normal. We discussed with a CHA2DS2VASc  score of 2, (female, age)she can stop eliquis but keep remainder on hand if she has an extended episode of afib.   F/u in Afib clinic, 02/17/23. She is currently in NSR. Seen in ED for Afib 02/16/23. Rate controlled and did not do cardioversion. She is not on anticoagulation due to low Chads score. Her episode was ~9 hours in duration. She has Lopressor PRN at home and took one yesterday. She notes her husband is sick and is Covid-test pending. She has started to have upper respiratory symptoms.    Today, she denies symptoms of palpitations, chest pain, shortness of breath, orthopnea, PND, lower extremity edema, dizziness, presyncope, syncope, or neurologic sequela. The patient is tolerating medications without difficulties and is otherwise without complaint today.   Past Medical History:  Diagnosis Date   Diverticulitis    Dyspnea    walking up hill   Headache 11/01/2019   Past Surgical History:  Procedure Laterality Date   ABDOMINAL HYSTERECTOMY      APPENDECTOMY     CYST EXCISION Right 11/02/2015   Procedure: RIGHT INDEX EXCISION MASS ;  Surgeon: Betha Loa, MD;  Location:  SURGERY CENTER;  Service: Orthopedics;  Laterality: Right;   CYSTOSCOPY Bilateral 11/09/2019   Procedure: CYSTOSCOPY WITH BILATERAL FIREFLY  INJECTION;  Surgeon: Rene Paci, MD;  Location: [facility];  Service: Urology;  Laterality: Bilateral;   DIAGNOSTIC LAPAROSCOPY     TONSILLECTOMY     URETHRAL DILATION      Current Outpatient Medications  Medication Sig Dispense Refill   apixaban (ELIQUIS) 5 MG TABS tablet Take 1 tablet (5 mg total) by mouth 2 (two) times daily. 60 tablet 0   ASHWAGANDHA PO Take 1,000 mg by mouth every morning.     aspirin-acetaminophen-caffeine (EXCEDRIN MIGRAINE) 250-250-65 MG tablet Take 2 tablets by mouth as needed for headache.     CHELATED MAGNESIUM PO Take 200 mg by mouth daily.     Cholecalciferol (VITAMIN D) 125 MCG (5000 UT) CAPS Take 5,000 Units by mouth daily.      fluticasone (FLONASE) 50 MCG/ACT nasal spray Place 1 spray into both nostrils daily as needed for allergies.      GARLIC PO Take 1 capsule by mouth daily.      LORazepam (ATIVAN) 0.5 MG tablet Take 0.5 mg by mouth at bedtime as needed for sleep.   0   metoprolol tartrate (LOPRESSOR)  25 MG tablet Take 1 tablet (25 mg total) by mouth 2 (two) times daily as needed. 30 tablet 0   Multiple Vitamin (MULTIVITAMIN WITH MINERALS) TABS tablet Take 1 tablet by mouth daily.     Omega-3 Fatty Acids (FISH OIL) 1200 MG CAPS Take 1,200 mg by mouth daily.      OVER THE COUNTER MEDICATION Take 2 capsules by mouth daily. Adrenal Support Blend     Probiotic Product (PROBIOTIC PO) Take 1 capsule by mouth daily.      Ubiquinol 100 MG CAPS Take 100 mg by mouth daily.      No current facility-administered medications for this encounter.    Allergies  Allergen Reactions   Codeine Hives, Rash and Other (See Comments)    Intensifies pain instead of relieving    Adhesive [Tape] Rash   Other Other (See Comments)    Certain cholesterol medications give her cramps, she can't remember which ones   Pentothal [Thiopental] Rash    ROS- All systems are reviewed and negative except as per the HPI above  Physical Exam: Vitals:   02/17/23 0824  Height: 5' 10.5" (1.791 m)   Wt Readings from Last 3 Encounters:  03/26/22 104.1 kg  02/17/22 103.9 kg  01/25/22 98.2 kg    Labs: Lab Results  Component Value Date   NA 139 02/16/2023   K 3.4 (L) 02/16/2023   CL 103 02/16/2023   CO2 27 02/16/2023   GLUCOSE 134 (H) 02/16/2023   BUN 17 02/16/2023   CREATININE 0.95 02/16/2023   CALCIUM 9.0 02/16/2023   MG 2.2 02/16/2023   No results found for: "INR" No results found for: "CHOL", "HDL", "LDLCALC", "TRIG"  GEN- The patient is well appearing, alert and oriented x 3 today.   Neck - no JVD or carotid bruit noted Lungs- Clear to ausculation bilaterally, normal work of breathing Heart- Regular rate and rhythm, no murmurs, rubs or gallops, PMI not laterally displaced Extremities- no clubbing, cyanosis, or edema Skin - no rash or ecchymosis noted   EKG: Vent. rate 58 BPM PR interval 142 ms QRS duration 88 ms QT/QTcB 412/404 ms P-R-T axes 70 59 65 Sinus bradycardia Otherwise normal ECG When compared with ECG of 16-Feb-2023 00:17, PREVIOUS ECG IS PRESENT  Echo- 1. Left ventricular ejection fraction, by estimation, is 60 to 65%. The  left ventricle has normal function. The left ventricle has no regional  wall motion abnormalities. Left ventricular diastolic parameters were  normal.   2. Right ventricular systolic function is normal. The right ventricular  size is normal. Tricuspid regurgitation signal is inadequate for assessing  PA pressure.   3. The mitral valve is normal in structure. No evidence of mitral valve  regurgitation.   4. The aortic valve is tricuspid. Aortic valve regurgitation is not  visualized. No aortic stenosis is present.    Assessment and Plan:  1. Paroxysmal Afib  She is currently in NSR. She will continue prn Lopressor for this week and then stop medication, continuing to take it as needed. She will continue for conservative observation for now.  2. CHA2DS2VASc  score of 2 Discussed guidelines with pt and per guidelines at this point she does not need daily anticoagulation   She can restart if prolonged episode of afib and contact the office    F/u Afib clinic prn.  Justin Mend, PA-C [facility] [address] [place], [place] [address] [phone]

## 2023-02-23 ENCOUNTER — Ambulatory Visit (HOSPITAL_COMMUNITY): Payer: Medicare Other | Admitting: Internal Medicine

## 2023-03-04 DIAGNOSIS — K080 Exfoliation of teeth due to systemic causes: Secondary | ICD-10-CM | POA: Diagnosis not present

## 2023-04-17 ENCOUNTER — Ambulatory Visit: Payer: Medicare Other | Attending: Cardiology | Admitting: Cardiology

## 2023-04-17 ENCOUNTER — Encounter: Payer: Self-pay | Admitting: Cardiology

## 2023-04-17 VITALS — BP 122/70 | HR 66 | Resp 16 | Ht 70.5 in | Wt 221.8 lb

## 2023-04-17 DIAGNOSIS — Z8249 Family history of ischemic heart disease and other diseases of the circulatory system: Secondary | ICD-10-CM | POA: Diagnosis not present

## 2023-04-17 DIAGNOSIS — E785 Hyperlipidemia, unspecified: Secondary | ICD-10-CM | POA: Diagnosis not present

## 2023-04-17 DIAGNOSIS — I480 Paroxysmal atrial fibrillation: Secondary | ICD-10-CM

## 2023-04-17 NOTE — Progress Notes (Signed)
Cardiology Office Note:  .   Date:  04/17/2023  ID:  Dawn Haley, Dawn Haley 12/11/1955, MRN [id number] PCP: Jackelyn Poling, DO  [facility] Providers Cardiologist:  None     History of Present Illness: .   Dawn Haley is a 67 y.o. female Discussed with the use of AI scribe   History of Present Illness   The patient, a 67 year old female with a history of atrial fibrillation, presents for follow-up. She was seen in the emergency department for atrial fibrillation on 02/16/23 and was found to be in normal sinus rhythm. The episode lasted approximately nine hours. She was not on anticoagulation due to a low CHADS-VASc score. The patient's echocardiogram was normal with an EF of 65%.  The patient reports two episodes of atrial fibrillation, the second of which occurred two days after a diagnosis of COVID-19. She has not experienced any further episodes since. The patient has metoprolol as a 'pill in the pocket' for use if she feels an episode of atrial fibrillation starting. She reports no chest pain or shortness of breath.  The patient has a CHADS-VASc score of 2, with risk factors being age and gender. She has no history of high blood pressure. The patient is aware of the potential need for anticoagulation if her risk factors increase.  The patient also reports a history of high cholesterol, with an LDL of 160. She has a family history of heart attacks and strokes, with her father experiencing a heart attack around the age of 11. The patient does not have high blood pressure, diabetes, or a history of smoking. She reports moderate alcohol consumption.  The patient has been experiencing stress due to family and work situations, which may have contributed to her health issues. She has recently retired from her job as a Human resources officer. The patient also reports a history of vertigo and fluid in the ears, which is managed with chlorthalidone. She has a potassium level of 3.4, which is on the  lower side.            Studies Reviewed: .        Results   LABS LDL: 160 mg/dL (19/14/7829) Triglycerides: 108 mg/dL (56/21/3086) V7Q: 4.6% (02/04/2023) Hb: 15.2 g/dL (96/29/5284) Cr: 1.32 mg/dL (44/05/270) K: 3.4 mmol/L (02/2023)  DIAGNOSTIC Echocardiogram: EF 65% (02/16/2023)     Risk Assessment/Calculations:            Physical Exam:   VS:  BP 122/70 (BP Location: Right Arm, Patient Position: Sitting, Cuff Size: Large)   Pulse 66   Resp 16   Ht 5' 10.5" (1.791 m)   Wt 221 lb 12.8 oz (100.6 kg)   SpO2 97%   BMI 31.38 kg/m    Wt Readings from Last 3 Encounters:  04/17/23 221 lb 12.8 oz (100.6 kg)  02/17/23 222 lb 3.2 oz (100.8 kg)  03/26/22 229 lb 9.6 oz (104.1 kg)    GEN: Well nourished, well developed in no acute distress NECK: No JVD; No carotid bruits CARDIAC: RRR, no murmurs, no rubs, no gallops RESPIRATORY:  Clear to auscultation without rales, wheezing or rhonchi  ABDOMEN: Soft, non-tender, non-distended EXTREMITIES:  No edema; No deformity   ASSESSMENT AND PLAN: .    Assessment and Plan    Atrial Fibrillation Atrial fibrillation episode on 02/16/2023, lasting nine hours. Currently in normal sinus rhythm. No cardioversion performed. Not on anticoagulation due to CHADS-VASc score of 2. Episodes associated with COVID-19. No chest pain or dyspnea. Echocardiogram  shows EF 65%. Discussed stroke risks and anticoagulation options. - Continue metoprolol as needed (pill in the pocket) - Monitor for recurrence of symptoms - Contact cardiologist if AFib episodes recur  Hyperlipidemia LDL cholesterol 160 mg/dL as of 38/75/6433. Family history of heart disease. Patient allergic to statins. Discussed coronary calcium score for assessing calcified plaque. Non-statin alternatives may be considered if calcium score is elevated. - Order coronary calcium score (CT scan) - Discuss non-statin alternatives if calcium score is elevated - Encourage Mediterranean diet  and regular exercise  Hypokalemia Potassium level 3.4 mmol/L in October. Patient on chlorthalidone for fluid management. Discussed monitoring potassium levels and potential supplementation. - Monitor potassium levels - Discuss potassium supplementation with primary care physician if necessary  General Health Maintenance Needs mammogram and bone density test. Discussed coordinating with coronary calcium score. - Coordinate scheduling of mammogram and bone density test with imaging center  Follow-up - PRN follow-up with cardiologist if AFib symptoms recur - Provide results of coronary calcium score and discuss further management based on findings.             Signed, Donato Schultz, MD

## 2023-04-17 NOTE — Patient Instructions (Signed)
Medication Instructions:  The current medical regimen is effective;  continue present plan and medications.  *If you need a refill on your cardiac medications before your next appointment, please call your pharmacy*  Testing/Procedures: Your physician has requested that you have a Coronary Calcium score which is completed by CT. Cardiac computed tomography (CT) is a painless test that uses an x-ray machine to take clear, detailed pictures of your heart. There are no instructions for this testing.  You may eat/drink and take your normal medications this day.  The cost of the testing is $99 due at the time of your appointment.  Follow-Up: At [facility], you and your health needs are our priority.  As part of our continuing mission to provide you with exceptional heart care, we have created designated Provider Care Teams.  These Care Teams include your primary Cardiologist (physician) and Advanced Practice Providers (APPs -  Physician Assistants and Nurse Practitioners) who all work together to provide you with the care you need, when you need it.  We recommend signing up for the patient portal called "MyChart".  Sign up information is provided on this After Visit Summary.  MyChart is used to connect with patients for Virtual Visits (Telemedicine).  Patients are able to view lab/test results, encounter notes, upcoming appointments, etc.  Non-urgent messages can be sent to your provider as well.   To learn more about what you can do with MyChart, go to [email or web address].    Your next appointment:   Follow up will be based on the results of the above testing.

## 2023-04-27 ENCOUNTER — Telehealth: Payer: Self-pay | Admitting: Cardiology

## 2023-04-27 NOTE — Telephone Encounter (Signed)
STAT if HR is under 50 or over 120 (normal HR is 60-100 beats per minute)  What is your heart rate? 90's   Do you have a log of your heart rate readings (document readings)? No   Do you have any other symptoms? Pt states she has been feeling a little shaky. Please advise

## 2023-04-27 NOTE — Telephone Encounter (Signed)
Spoke with pt who reports she is feeling better as she took her prn dose of Metoprolol.  Her heart rate is now 67-68 bpm.  She is asking what can cause this.  Advised possible multiple factors including stress, dietary change, increased stimulants.  She is also reporting a headache - advised to make sure she is staying well hydrated.  She was appreciative of the call back and information.

## 2023-05-21 DIAGNOSIS — M898X7 Other specified disorders of bone, ankle and foot: Secondary | ICD-10-CM | POA: Diagnosis not present

## 2023-06-04 ENCOUNTER — Other Ambulatory Visit (HOSPITAL_BASED_OUTPATIENT_CLINIC_OR_DEPARTMENT_OTHER): Payer: Medicare Other

## 2023-06-16 ENCOUNTER — Other Ambulatory Visit: Payer: Self-pay | Admitting: Family Medicine

## 2023-06-16 DIAGNOSIS — Z0000 Encounter for general adult medical examination without abnormal findings: Secondary | ICD-10-CM

## 2023-06-17 ENCOUNTER — Other Ambulatory Visit: Payer: Self-pay | Admitting: Family Medicine

## 2023-06-17 DIAGNOSIS — M8580 Other specified disorders of bone density and structure, unspecified site: Secondary | ICD-10-CM

## 2023-06-18 ENCOUNTER — Ambulatory Visit
Admission: RE | Admit: 2023-06-18 | Discharge: 2023-06-18 | Disposition: A | Discharge status: Home / Self Care | Payer: Medicare Other | Source: Ambulatory Visit | Attending: Family Medicine | Admitting: Family Medicine

## 2023-06-18 DIAGNOSIS — Z1231 Encounter for screening mammogram for malignant neoplasm of breast: Secondary | ICD-10-CM | POA: Diagnosis not present

## 2023-06-18 DIAGNOSIS — Z0000 Encounter for general adult medical examination without abnormal findings: Secondary | ICD-10-CM

## 2023-07-07 DIAGNOSIS — M1711 Unilateral primary osteoarthritis, right knee: Secondary | ICD-10-CM | POA: Diagnosis not present

## 2023-07-14 DIAGNOSIS — M25561 Pain in right knee: Secondary | ICD-10-CM | POA: Diagnosis not present

## 2023-07-30 ENCOUNTER — Ambulatory Visit (HOSPITAL_BASED_OUTPATIENT_CLINIC_OR_DEPARTMENT_OTHER)
Admission: RE | Admit: 2023-07-30 | Discharge: 2023-07-30 | Disposition: A | Discharge status: Home / Self Care | Payer: Self-pay | Source: Ambulatory Visit | Attending: Cardiology | Admitting: Cardiology

## 2023-07-30 DIAGNOSIS — Z8249 Family history of ischemic heart disease and other diseases of the circulatory system: Secondary | ICD-10-CM | POA: Insufficient documentation

## 2023-07-30 DIAGNOSIS — E785 Hyperlipidemia, unspecified: Secondary | ICD-10-CM | POA: Insufficient documentation

## 2023-07-31 ENCOUNTER — Encounter: Payer: Self-pay | Admitting: Cardiology

## 2023-07-31 DIAGNOSIS — M25561 Pain in right knee: Secondary | ICD-10-CM | POA: Diagnosis not present

## 2023-08-15 DIAGNOSIS — M25561 Pain in right knee: Secondary | ICD-10-CM | POA: Diagnosis not present

## 2023-08-24 DIAGNOSIS — M25561 Pain in right knee: Secondary | ICD-10-CM | POA: Diagnosis not present

## 2023-10-15 DIAGNOSIS — M25562 Pain in left knee: Secondary | ICD-10-CM | POA: Diagnosis not present

## 2023-10-15 DIAGNOSIS — M1711 Unilateral primary osteoarthritis, right knee: Secondary | ICD-10-CM | POA: Diagnosis not present

## 2023-10-16 ENCOUNTER — Telehealth: Payer: Self-pay

## 2023-10-16 ENCOUNTER — Encounter: Payer: Self-pay | Admitting: Cardiology

## 2023-10-16 ENCOUNTER — Telehealth: Payer: Self-pay | Admitting: *Deleted

## 2023-10-16 NOTE — Telephone Encounter (Signed)
 Med Rec and Consent done    Patient Consent for Virtual Visit        Dawn Haley has provided verbal consent on 10/16/2023 for a virtual visit (video or telephone).   CONSENT FOR VIRTUAL VISIT FOR:  Dawn Haley  By participating in this virtual visit I agree to the following:  I hereby voluntarily request, consent and authorize [facility] and its employed or contracted physicians, physician assistants, nurse practitioners or other licensed health care professionals (the Practitioner), to provide me with telemedicine health care services (the "Services") as deemed necessary by the treating Practitioner. I acknowledge and consent to receive the Services by the Practitioner via telemedicine. I understand that the telemedicine visit will involve communicating with the Practitioner through live audiovisual communication technology and the disclosure of certain medical information by electronic transmission. I acknowledge that I have been given the opportunity to request an in-person assessment or other available alternative prior to the telemedicine visit and am voluntarily participating in the telemedicine visit.  I understand that I have the right to withhold or withdraw my consent to the use of telemedicine in the course of my care at any time, without affecting my right to future care or treatment, and that the Practitioner or I may terminate the telemedicine visit at any time. I understand that I have the right to inspect all information obtained and/or recorded in the course of the telemedicine visit and may receive copies of available information for a reasonable fee.  I understand that some of the potential risks of receiving the Services via telemedicine include:  Delay or interruption in medical evaluation due to technological equipment failure or disruption; Information transmitted may not be sufficient (e.g. poor resolution of images) to allow for appropriate medical decision  making by the Practitioner; and/or  In rare instances, security protocols could fail, causing a breach of personal health information.  Furthermore, I acknowledge that it is my responsibility to provide information about my medical history, conditions and care that is complete and accurate to the best of my ability. I acknowledge that Practitioner's advice, recommendations, and/or decision may be based on factors not within their control, such as incomplete or inaccurate data provided by me or distortions of diagnostic images or specimens that may result from electronic transmissions. I understand that the practice of medicine is not an exact science and that Practitioner makes no warranties or guarantees regarding treatment outcomes. I acknowledge that a copy of this consent can be made available to me via my patient portal [facility] MyChart), or I can request a printed copy by calling the office of  [facility].    I understand that my insurance will be billed for this visit.   I have read or had this consent read to me. I understand the contents of this consent, which adequately explains the benefits and risks of the Services being provided via telemedicine.  I have been provided ample opportunity to ask questions regarding this consent and the Services and have had my questions answered to my satisfaction. I give my informed consent for the services to be provided through the use of telemedicine in my medical care

## 2023-10-16 NOTE — Telephone Encounter (Signed)
   Name: Dawn Haley  DOB: 06/09/1955  MRN: [id number]  Primary Cardiologist: None   Preoperative team, please contact this patient and set up a phone call appointment for further preoperative risk assessment. Please obtain consent and complete medication review. Thank you for your help.  I confirm that guidance regarding antiplatelet and oral anticoagulation therapy has been completed and, if necessary, noted below (none requested).   I also confirmed the patient resides in the state of [place] . As per [facility] Medical [employer] telemedicine laws, the patient must reside in the state in which the provider is licensed.   Jude Norton, NP 10/16/2023, 11:21 AM [facility]

## 2023-10-16 NOTE — Telephone Encounter (Signed)
   Pre-operative Risk Assessment    Patient Name: Dawn Haley  DOB: 15-May-1955 MRN: [id number]   Date of last office visit: 04/17/23 DR. SKAINS Date of next office visit: NONE   Request for Surgical Clearance    Procedure:  RIGHT TOTAL KNEE ARTHROPLASTY  Date of Surgery:  Clearance TBD                                Surgeon:  DR. Marionette Sick Surgeon's Group or Practice Name:  [facility] Phone number:  [phone] ATTN: MEGAN DAVIS Fax number:  [phone]   Type of Clearance Requested:   - Medical ; NONE INDICATED ON FORM TO BE HELD   Type of Anesthesia:  CHOICE   Additional requests/questions:    [name]   10/16/2023, 11:04 AM

## 2023-10-16 NOTE — Telephone Encounter (Signed)
 S/W pt and scheduled TELE Preop app 10/21/23. Med Rec and consent done.

## 2023-10-19 DIAGNOSIS — M25561 Pain in right knee: Secondary | ICD-10-CM | POA: Diagnosis not present

## 2023-10-19 DIAGNOSIS — E785 Hyperlipidemia, unspecified: Secondary | ICD-10-CM | POA: Diagnosis not present

## 2023-10-19 DIAGNOSIS — Z01818 Encounter for other preprocedural examination: Secondary | ICD-10-CM | POA: Diagnosis not present

## 2023-10-21 ENCOUNTER — Encounter: Payer: Self-pay | Admitting: Nurse Practitioner

## 2023-10-21 ENCOUNTER — Ambulatory Visit: Attending: Cardiovascular Disease | Admitting: Nurse Practitioner

## 2023-10-21 DIAGNOSIS — Z01810 Encounter for preprocedural cardiovascular examination: Secondary | ICD-10-CM

## 2023-10-21 NOTE — Progress Notes (Signed)
 Virtual Visit via Telephone Note   Because of Dawn Haley co-morbid illnesses, she is at least at moderate risk for complications without adequate follow up.  This format is felt to be most appropriate for this patient at this time.  Due to technical limitations with video connection (technology), today's appointment will be conducted as an audio only telehealth visit, and Dawn Haley verbally agreed to proceed in this manner.   All issues noted in this document were discussed and addressed.  No physical exam could be performed with this format.  Evaluation Performed:  Preoperative cardiovascular risk assessment _____________   Date:  10/21/2023   Patient ID:  Dawn Haley, Dawn Haley [birth year], MRN [id number] Patient Location:  Home Provider location:   Office  Primary Care Provider:  Mordechai April, DO Primary Cardiologist:  None  Chief Complaint / Patient Profile   68 y.o. y/o female with a h/o atrial fibrillation with CHA2DS2-VASc score of 2 not on anticoagulation, vertigo treated with chlorthalidone, hyperlipidemia, coronary calcium  score of 0 on 07/2023 who is pending right total knee arthroplasty with Dr. Brunilda Capra on date TBD and presents today for telephonic preoperative cardiovascular risk assessment.  History of Present Illness    Dawn Haley is a 68 y.o. female who presents via audio/video conferencing for a telehealth visit today.  Pt was last seen in cardiology clinic on 04/17/23 by Dr. Renna Cary.  At that time Dawn Haley was doing well.  The patient is now pending procedure as outlined above. Since her last visit, she denies chest pain, shortness of breath, lower extremity edema, fatigue, palpitations, weakness, presyncope, syncope, orthopnea, and PND. She is limited by knee pain following traumatic injury to right knee, but prior to injury she had no activity limitations. She continues to be able to achieve > 4 METS without concerning cardiac symptoms.    Past Medical  History    Past Medical History:  Diagnosis Date   Diverticulitis    Dyspnea    walking up hill   Headache 11/01/2019   Past Surgical History:  Procedure Laterality Date   ABDOMINAL HYSTERECTOMY     APPENDECTOMY     CYST EXCISION Right 11/02/2015   Procedure: RIGHT INDEX EXCISION MASS ;  Surgeon: Brunilda Capra, MD;  Location: [facility];  Service: Orthopedics;  Laterality: Right;   CYSTOSCOPY Bilateral 11/09/2019   Procedure: CYSTOSCOPY WITH BILATERAL FIREFLY  INJECTION;  Surgeon: Adelbert Homans, MD;  Location: [facility];  Service: Urology;  Laterality: Bilateral;   DIAGNOSTIC LAPAROSCOPY     TONSILLECTOMY     URETHRAL DILATION      Allergies  Allergies  Allergen Reactions   Codeine Hives, Rash and Other (See Comments)    Intensifies pain instead of relieving   Adhesive [Tape] Rash   Other Other (See Comments)    Certain cholesterol medications give her cramps, she can't remember which ones   Pentothal [Thiopental] Rash    Home Medications    Prior to Admission medications   Medication Sig Start Date End Date Taking? Authorizing Provider  ASHWAGANDHA PO Take 800 mg by mouth at bedtime.    [provider]  aspirin -acetaminophen -caffeine (EXCEDRIN MIGRAINE) 250-250-65 MG tablet Take 2 tablets by mouth as needed for headache. Patient not taking: Reported on 10/16/2023    [provider]  Black Pepper-Turmeric (TURMERIC PLUS BLACK PEPPER EXT PO) Take 1 tablet by mouth once a week. With Curcuminoids    [provider]  CHELATED MAGNESIUM  PO Take 225 mg by mouth daily. Has magnesium citrate, Glycinate, Malate, Zinc and Vitamin D3    [provider]  chlorthalidone (HYGROTON) 25 MG tablet 1 tablet twice a week by oral route. 10/03/22   [provider]  Cholecalciferol (VITAMIN D) 125 MCG (5000 UT) CAPS Take 5,000 Units by mouth daily. Includes K12    [provider]  GARLIC PO Take 1 capsule by mouth daily.     [provider]  loratadine-pseudoephedrine (CLARITIN-D 12 HOUR) 5-120 MG tablet 1 tablet as needed by oral route. Patient not taking: Reported on 10/16/2023 04/04/19   [provider]  LORazepam  (ATIVAN ) 0.5 MG tablet Take 0.5 mg by mouth at bedtime as needed for sleep.  05/30/15   [provider]  meloxicam (MOBIC) 15 MG tablet Take 15 mg by mouth as needed. 01/09/23   [provider]  Menaquinone-7 (VITAMIN K2 PO) Take 50 mcg by mouth daily.    [provider]  metoprolol  tartrate (LOPRESSOR ) 25 MG tablet Take 1 tablet (25 mg total) by mouth 2 (two) times daily as needed. Patient not taking: Reported on 10/16/2023 01/25/22   Spence Dux, PA-C  Multiple Vitamin (MULTIVITAMIN WITH MINERALS) TABS tablet Take 1 tablet by mouth daily.    [provider]  NON FORMULARY Take 1 Scoop by mouth daily. Multi Peptide Collagen with Vitamin C, Biotin, and Hydroloric acid    [provider]  omega-3 acid ethyl esters (LOVAZA) 1 g capsule Take 1,280 mg by mouth 2 (two) times daily. 10/22/15   [provider]  OVER THE COUNTER MEDICATION Take 2 capsules by mouth daily. Adrenal Support Blend    [provider]  potassium chloride  (KLOR-CON ) 10 MEQ tablet Take 10 mEq by mouth 2 (two) times a week. 10/03/22   [provider]  Probiotic Product (PROBIOTIC PO) Take 1 capsule by mouth daily.     [provider]  Ubiquinol 100 MG CAPS Take 100 mg by mouth daily.     [provider]    Physical Exam    Vital Signs:  Dawn Haley does not have vital signs available for review today.  Given telephonic nature of communication, physical exam is limited. AAOx3. NAD. Normal affect.  Speech and respirations are unlabored.  Accessory Clinical Findings    None  Assessment & Plan    1.  Preoperative Cardiovascular Risk Assessment: According to the Revised Cardiac Risk Index (RCRI), her Perioperative Risk of Major  Cardiac Event is (%): 0.4. Her Functional Capacity in METs is: 7.59 according to the Duke Activity Status Index (DASI). The patient is doing well from a cardiac perspective. Therefore, based on ACC/AHA guidelines, the patient would be at acceptable risk for the planned procedure without further cardiovascular testing.   The patient was advised that if she develops new symptoms prior to surgery to contact our office to arrange for a follow-up visit, and she verbalized understanding.  No request to hold cardiac medications  A copy of this note will be routed to requesting surgeon.  Time:   Today, I have spent 10 minutes with the patient with telehealth technology discussing medical history, symptoms, and management plan.     Gerldine Koch, NP-C  10/21/2023, 2:45 PM [address] [place], [place] [address] Office [phone] Fax [phone]

## 2023-11-19 DIAGNOSIS — M25562 Pain in left knee: Secondary | ICD-10-CM | POA: Diagnosis not present

## 2023-11-25 NOTE — H&P (Signed)
 Patient's anticipated LOS is less than 2 midnights, meeting these requirements: - Younger than 64 - Lives within 1 hour of care - Has a competent adult at home to recover with post-op recover - NO history of  - Chronic pain requiring opiods  - Diabetes  - Coronary Artery Disease  - Heart failure  - Heart attack  - Stroke  - DVT/VTE  - Cardiac arrhythmia  - Respiratory Failure/COPD  - Renal failure  - Anemia  - Advanced Liver disease     Dawn Haley is an 68 y.o. female.    Chief Complaint: right knee pain  HPI: Pt is a 68 y.o. female complaining of right knee pain for multiple years. Pain had continually increased since the beginning. X-rays in the clinic show end-stage arthritic changes of the right knee. Pt has tried various conservative treatments which have failed to alleviate their symptoms, including injections and therapy. Various options are discussed with the patient. Risks, benefits and expectations were discussed with the patient. Patient understand the risks, benefits and expectations and wishes to proceed with surgery.   PCP:  Dayna Motto, DO  D/C Plans: Home  PMH: Past Medical History:  Diagnosis Date   Diverticulitis    Dyspnea    walking up hill   Headache 11/01/2019    PSH: Past Surgical History:  Procedure Laterality Date   ABDOMINAL HYSTERECTOMY     APPENDECTOMY     CYST EXCISION Right 11/02/2015   Procedure: RIGHT INDEX EXCISION MASS ;  Surgeon: Franky Curia, MD;  Location: [facility];  Service: Orthopedics;  Laterality: Right;   CYSTOSCOPY Bilateral 11/09/2019   Procedure: CYSTOSCOPY WITH BILATERAL FIREFLY  INJECTION;  Surgeon: Devere Lonni Righter, MD;  Location: [facility];  Service: Urology;  Laterality: Bilateral;   DIAGNOSTIC LAPAROSCOPY     TONSILLECTOMY     URETHRAL DILATION      Social History:  reports that she has never smoked. She has never used smokeless tobacco. She reports current alcohol use of about 4.0  standard drinks of alcohol per week. She reports that she does not use drugs. BMI: Estimated body mass index is 31.38 kg/m as calculated from the following:   Height as of 04/17/23: 5' 10.5 (1.791 m).   Weight as of 04/17/23: 100.6 kg.  Lab Results  Component Value Date   ALBUMIN 4.2 01/25/2022   Diabetes: Patient does not have a diagnosis of diabetes.     Smoking Status:   reports that she has never smoked. She has never used smokeless tobacco.    Allergies:  Allergies  Allergen Reactions   Codeine Hives, Rash and Other (See Comments)    Intensifies pain instead of relieving   Adhesive [Tape] Rash   Other Other (See Comments)    Certain cholesterol medications give her cramps, she can't remember which ones   Pentothal [Thiopental] Rash    Medications: No current facility-administered medications for this encounter.   Current Outpatient Medications  Medication Sig Dispense Refill   ASHWAGANDHA PO Take 800 mg by mouth at bedtime.     aspirin -acetaminophen -caffeine (EXCEDRIN MIGRAINE) 250-250-65 MG tablet Take 2 tablets by mouth as needed for headache. (Patient not taking: Reported on 10/16/2023)     Black Pepper-Turmeric (TURMERIC PLUS BLACK PEPPER EXT PO) Take 1 tablet by mouth once a week. With Curcuminoids     CHELATED MAGNESIUM PO Take 225 mg by mouth daily. Has magnesium citrate, Glycinate, Malate, Zinc and Vitamin D3     chlorthalidone (  HYGROTON) 25 MG tablet 1 tablet twice a week by oral route.     Cholecalciferol (VITAMIN D) 125 MCG (5000 UT) CAPS Take 5,000 Units by mouth daily. Includes K12     GARLIC PO Take 1 capsule by mouth daily.     loratadine-pseudoephedrine (CLARITIN-D 12 HOUR) 5-120 MG tablet 1 tablet as needed by oral route. (Patient not taking: Reported on 10/16/2023)     LORazepam  (ATIVAN ) 0.5 MG tablet Take 0.5 mg by mouth at bedtime as needed for sleep.   0   meloxicam (MOBIC) 15 MG tablet Take 15 mg by mouth as needed.     Menaquinone-7 (VITAMIN K2 PO)  Take 50 mcg by mouth daily.     metoprolol  tartrate (LOPRESSOR ) 25 MG tablet Take 1 tablet (25 mg total) by mouth 2 (two) times daily as needed. (Patient not taking: Reported on 10/16/2023) 30 tablet 0   Multiple Vitamin (MULTIVITAMIN WITH MINERALS) TABS tablet Take 1 tablet by mouth daily.     NON FORMULARY Take 1 Scoop by mouth daily. Multi Peptide Collagen with Vitamin C, Biotin, and Hydroloric acid     omega-3 acid ethyl esters (LOVAZA) 1 g capsule Take 1,280 mg by mouth 2 (two) times daily.     OVER THE COUNTER MEDICATION Take 2 capsules by mouth daily. Adrenal Support Blend     potassium chloride  (KLOR-CON ) 10 MEQ tablet Take 10 mEq by mouth 2 (two) times a week.     Probiotic Product (PROBIOTIC PO) Take 1 capsule by mouth daily.      Ubiquinol 100 MG CAPS Take 100 mg by mouth daily.       No results found for this or any previous visit (from the past 48 hours). No results found.  ROS: Pain with rom of the right lower extremity  Physical Exam: Alert and oriented 68 y.o. female in no acute distress Cranial nerves 2-12 intact Cervical spine: full rom with no tenderness, nv intact distally Chest: active breath sounds bilaterally, no wheeze rhonchi or rales Heart: regular rate and rhythm, no murmur Abd: non tender non distended with active bowel sounds Hip is stable with rom  Right knee painful rom with crepitus Nv intact distally No rashes or edema   Assessment/Plan Assessment: right knee end stage osteoarthritis  Plan:  Patient will undergo a right total knee by Dr. Kay at [place] Risks benefits and expectations were discussed with the patient. Patient understand risks, benefits and expectations and wishes to proceed. Preoperative templating of the joint replacement has been completed, documented, and submitted to the Operating Room personnel in order to optimize intra-operative equipment management.   Arvella Fireman PA-C, [facility] is now [employer] [address], [place], [place] [address] Phone: [phone] [email or web address] Facebook  [employer]

## 2023-12-10 ENCOUNTER — Other Ambulatory Visit (HOSPITAL_BASED_OUTPATIENT_CLINIC_OR_DEPARTMENT_OTHER)

## 2023-12-13 NOTE — Patient Instructions (Signed)
 SURGICAL WAITING ROOM VISITATION Patients having surgery or a procedure may have no more than 2 support people in the waiting area - these visitors may rotate in the visitor waiting room.   If the patient needs to stay at the hospital during part of their recovery, the visitor guidelines for inpatient rooms apply.  PRE-OP VISITATION  Pre-op nurse will coordinate an appropriate time for 1 support person to accompany the patient in pre-op.  This support person may not rotate.  This visitor will be contacted when the time is appropriate for the visitor to come back in the pre-op area.  Please refer to the [facility] website for the visitor guidelines for Inpatients (after your surgery is over and you are in a regular room).  You are not required to quarantine at this time prior to your surgery. However, you must do this: Hand Hygiene often Do NOT share personal items Notify your provider if you are in close contact with someone who has COVID or you develop fever 100.4 or greater, new onset of sneezing, cough, sore throat, shortness of breath or body aches.  If you test positive for Covid or have been in contact with anyone that has tested positive in the last 10 days please notify you surgeon.    Your procedure is scheduled on:  FRIDAY  December 25, 2023  Report to [facility] Main Entrance: [address] entrance where the [employer] is available.   Report to admitting at:  06:30 AM  Call this number if you have any questions or problems the morning of surgery [phone]  Do not eat food after Midnight the night prior to your surgery/procedure.  After Midnight you may have the following liquids until  06:00  AM DAY OF SURGERY  Clear Liquid Diet Water  Black Coffee (sugar ok, NO MILK/CREAM OR CREAMERS)  Tea (sugar ok, NO MILK/CREAM OR CREAMERS) regular and decaf                             Plain Jell-O  with no fruit (NO RED)                                           Fruit ices  (not with fruit pulp, NO RED)                                     Popsicles (NO RED)                                                                  Juice: NO CITRUS JUICES: only apple, WHITE grape, WHITE cranberry Sports drinks like Gatorade or Powerade (NO RED)                   The day of surgery:  Drink ONE (1) Pre-Surgery G2 at  06:00 AM the morning of surgery. Drink in one sitting. Do not sip.  This drink was given to you during your hospital pre-op appointment visit. Nothing else to drink after completing the Pre-Surgery  G2 : No candy, chewing gum or throat lozenges.    FOLLOW ANY ADDITIONAL PRE OP INSTRUCTIONS YOU RECEIVED FROM YOUR SURGEON'S OFFICE!!!   Oral Hygiene is also important to reduce your risk of infection.        Remember - BRUSH YOUR TEETH THE MORNING OF SURGERY WITH YOUR REGULAR TOOTHPASTE  Do NOT smoke after Midnight the night before surgery.  STOP TAKING all Vitamins, Herbs and supplements 1 week before your surgery.   Take ONLY these medicines the morning of surgery with A SIP OF WATER : none  DO NOT TAKE Chlorthalidone the morning of your surgery.                    You may not have any metal on your body including hair pins, jewelry, and body piercing  Do not wear make-up, lotions, powders, perfumes or deodorant  Do not wear nail polish including gel and S&S, artificial / acrylic nails, or any other type of covering on natural nails including finger and toenails. If you have artificial nails, gel coating, etc., that needs to be removed by a nail salon, Please have this removed prior to surgery. Not doing so may mean that your surgery could be cancelled or delayed if the Surgeon or anesthesia staff feels like they are unable to monitor you safely.   Do not shave 48 hours prior to surgery to avoid nicks in your skin which may contribute to postoperative infections.    Contacts, Hearing Aids, dentures or bridgework may not be worn into surgery. DENTURES  WILL BE REMOVED PRIOR TO SURGERY PLEASE DO NOT APPLY Poly grip OR ADHESIVES!!!  Patients discharged on the day of surgery will not be allowed to drive home.  Someone NEEDS to stay with you for the first 24 hours after anesthesia.  Do not bring your home medications to the hospital. The Pharmacy will dispense medications listed on your medication list to you during your admission in the Hospital.  Please read over the following fact sheets you were given: IF YOU HAVE QUESTIONS ABOUT YOUR PRE-OP INSTRUCTIONS, PLEASE CALL [phone].     Pre-operative 5 CHG Bath Instructions   You can play a key role in reducing the risk of infection after surgery. Your skin needs to be as free of germs as possible. You can reduce the number of germs on your skin by washing with CHG (chlorhexidine  gluconate) soap before surgery. CHG is an antiseptic soap that kills germs and continues to kill germs even after washing.   DO NOT use if you have an allergy to chlorhexidine /CHG or antibacterial soaps. If your skin becomes reddened or irritated, stop using the CHG and notify one of our RNs at [phone]  Please shower with the CHG soap starting 4 days before surgery using the following schedule: START SHOWERS ON   MONDAY  December 21, 2023  Please keep in mind the following:  DO NOT shave, including legs and underarms, starting the day of your first shower.   You may shave your face at any point before/day of surgery.   Place clean sheets on your bed the day you start using CHG soap. Use a clean washcloth (not used since being washed) for each shower. DO NOT sleep with pets once you start using the CHG.   CHG Shower Instructions:  If you choose to wash your hair and private area, wash first with your normal shampoo/soap.  After you use  shampoo/soap, rinse your hair and body thoroughly to remove shampoo/soap residue.  Turn the water  OFF and apply about 3 tablespoons (45 ml) of CHG soap to a CLEAN washcloth.  Apply CHG soap ONLY FROM YOUR NECK DOWN TO YOUR TOES (washing for 3-5 minutes)  DO NOT use CHG soap on face, private areas, open wounds, or sores.  Pay special attention to the area where your surgery is being performed.  If you are having back surgery, having someone wash your back for you may be helpful.  Wait 2 minutes after CHG soap is applied, then you may rinse off the CHG soap.  Pat dry with a clean towel  Put on clean clothes/pajamas   If you choose to wear lotion, please use ONLY the CHG-compatible lotions on the back of this paper.     Additional instructions for the day of surgery: DO NOT APPLY any lotions, deodorants, cologne, or perfumes.   Put on clean/comfortable clothes.  Brush your teeth.  Ask your nurse before applying any prescription medications to the skin.      CHG Compatible Lotions   Aveeno Moisturizing lotion  Cetaphil Moisturizing Cream  Cetaphil Moisturizing Lotion  Clairol Herbal Essence Moisturizing Lotion, Dry Skin  Clairol Herbal Essence Moisturizing Lotion, Extra Dry Skin  Clairol Herbal Essence Moisturizing Lotion, Normal Skin  Curel Age Defying Therapeutic Moisturizing Lotion with Alpha Hydroxy  Curel Extreme Care Body Lotion  Curel Soothing Hands Moisturizing Hand Lotion  Curel Therapeutic Moisturizing Cream, Fragrance-Free  Curel Therapeutic Moisturizing Lotion, Fragrance-Free  Curel Therapeutic Moisturizing Lotion, Original Formula  Eucerin Daily Replenishing Lotion  Eucerin Dry Skin Therapy Plus Alpha Hydroxy Crme  Eucerin Dry Skin Therapy Plus Alpha Hydroxy Lotion  Eucerin Original Crme  Eucerin Original Lotion  Eucerin Plus Crme Eucerin Plus Lotion  Eucerin TriLipid Replenishing Lotion  Keri Anti-Bacterial Hand Lotion  Keri Deep Conditioning Original Lotion  Dry Skin Formula Softly Scented  Keri Deep Conditioning Original Lotion, Fragrance Free Sensitive Skin Formula  Keri Lotion Fast Absorbing Fragrance Free Sensitive Skin Formula  Keri Lotion Fast Absorbing Softly Scented Dry Skin Formula  Keri Original Lotion  Keri Skin Renewal Lotion Keri Silky Smooth Lotion  Keri Silky Smooth Sensitive Skin Lotion  Nivea Body Creamy Conditioning Oil  Nivea Body Extra Enriched Lotion  Nivea Body Original Lotion  Nivea Body Sheer Moisturizing Lotion Nivea Crme  Nivea Skin Firming Lotion  NutraDerm 30 Skin Lotion  NutraDerm Skin Lotion  NutraDerm Therapeutic Skin Cream  NutraDerm Therapeutic Skin Lotion  ProShield Protective Hand Cream  Provon moisturizing lotion   FAILURE TO FOLLOW THESE INSTRUCTIONS MAY RESULT IN THE CANCELLATION OF YOUR SURGERY  PATIENT SIGNATURE_________________________________  NURSE SIGNATURE__________________________________  ________________________________________________________________________        Dawn Haley    An incentive spirometer is a tool that can help keep your lungs clear and active. This tool measures how well you are filling your lungs with each breath.  Taking long deep breaths may help reverse or decrease the chance of developing breathing (pulmonary) problems (especially infection) following: A long period of time when you are unable to move or be active. BEFORE THE PROCEDURE  If the spirometer includes an indicator to show your best effort, your nurse or respiratory therapist will set it to a desired goal. If possible, sit up straight or lean slightly forward. Try not to slouch. Hold the incentive spirometer in an upright position. INSTRUCTIONS FOR USE  Sit on the edge of your bed if possible, or sit up as far as you can in bed or on a chair. Hold the incentive spirometer in an upright position. Breathe out normally. Place the mouthpiece in your mouth and seal your lips tightly around  it. Breathe in slowly and as deeply as possible, raising the piston or the ball toward the top of the column. Hold your breath for 3-5 seconds or for as long as possible. Allow the piston or ball to fall to the bottom of the column. Remove the mouthpiece from your mouth and breathe out normally. Rest for a few seconds and repeat Steps 1 through 7 at least 10 times every 1-2 hours when you are awake. Take your time and take a few normal breaths between deep breaths. The spirometer may include an indicator to show your best effort. Use the indicator as a goal to work toward during each repetition. After each set of 10 deep breaths, practice coughing to be sure your lungs are clear. If you have an incision (the cut made at the time of surgery), support your incision when coughing by placing a pillow or rolled up towels firmly against it. Once you are able to get out of bed, walk around indoors and cough well. You may stop using the incentive spirometer when instructed by your caregiver.  RISKS AND COMPLICATIONS Take your time so you do not get dizzy or light-headed. If you are in pain, you may need to take or ask for pain medication before doing incentive spirometry. It is harder to take a deep breath if you are having pain. AFTER USE Rest and breathe slowly and easily. It can be helpful to keep track of a log of your progress. Your caregiver can provide you with a simple table to help with this. If you are using the spirometer at home, follow these instructions: SEEK MEDICAL CARE IF:  You are having difficultly using the spirometer. You have trouble using the spirometer as often as instructed. Your pain medication is not giving enough relief while using the spirometer. You develop fever of 100.5 F (38.1 C) or higher.                                                                                                    SEEK IMMEDIATE MEDICAL CARE IF:  You cough up bloody sputum that had not been  present before. You develop fever of 102 F (38.9 C) or greater. You develop worsening pain at or near the incision site. MAKE SURE YOU:  Understand these instructions. Will watch your  condition. Will get help right away if you are not doing well or get worse. Document Released: 09/08/2006 Document Revised: 07/21/2011 Document Reviewed: 11/09/2006 [facility] Patient Information 2014 [place], [place].        If you would like to see a video about joint replacement:   [email or web address]

## 2023-12-13 NOTE — Progress Notes (Signed)
 COVID Vaccine received:  []  No [x]  Yes Date of any COVID positive Test in last 90 days:  PCP - Bernardino Boone, DO at [facility] , [place] Sharps, [place] [phone]    clearance in 10-19-23 [facility] note Cardiologist - Medford Nanas, MD, Rosaline Bane, NP cardiac clearance in 10-21-23 Epic note.   Chest x-ray - 07-16-2022  2v  Epic EKG -  02-17-2023  Epic Stress Test -  ECHO - 02-21-2022  Epic Cardiac Cath -  CT Coronary Calcium  score:   0 on 07-30-23  Epic  Pacemaker / ICD device [x]  No []  Yes   Spinal Cord Stimulator:[x]  No []  Yes       History of Sleep Apnea? [x]  No []  Yes   CPAP used?- [x]  No []  Yes    Does the patient monitor blood sugar?   []  N/A   []  No []  Yes  Patient has: []  NO Hx DM   [x]  Pre-DM   []  DM1  []   DM2 Last A1c was: 6.1 on 02-04-2023         Blood Thinner / Instructions:  none Aspirin  Instructions:  nonr  ERAS Protocol Ordered: []  No  [x]  Yes PRE-SURGERY []  ENSURE  [x]  G2   Patient is to be NPO after: 06:00  Dental hx: []  Dentures:  []  N/A      []  Bridge or Partial:                   []  Loose or Damaged teeth:   Comments: Patient was given the 5 CHG shower / bath instructions for  TKA  surgery along with 2 bottles of the CHG soap. Patient will start this on: 12-21-2023  Monday             Activity level: Able to walk up 2 flights of stairs without becoming significantly short of breath or having chest pain?  []  No   []    Yes  Patient can perform ADLs without assistance. []  No   []   Yes  Anesthesia review: PAF (stopped Metoprolol - no BTs), HTN, Meniere's- vertigo, Pre-DM no meds,  s/p colectomy for colovaginal fistula repair.   Patient denies any S&S of respiratory illness or Covid - no shortness of breath, fever, cough or chest pain at PAT appointment.  Patient verbalized understanding and agreement to the Pre-Surgical Instructions that were given to them at this PAT appointment. Patient was also educated of the need to review these PAT instructions again  prior to her surgery.I reviewed the appropriate phone numbers to call if they have any and questions or concerns.

## 2023-12-15 ENCOUNTER — Encounter (HOSPITAL_COMMUNITY): Payer: Self-pay

## 2023-12-15 ENCOUNTER — Encounter (HOSPITAL_COMMUNITY)
Admission: RE | Admit: 2023-12-15 | Discharge: 2023-12-15 | Disposition: A | Discharge status: Home / Self Care | Source: Ambulatory Visit | Attending: Orthopedic Surgery | Admitting: Orthopedic Surgery

## 2023-12-15 ENCOUNTER — Other Ambulatory Visit: Payer: Self-pay

## 2023-12-15 VITALS — BP 122/68 | HR 55 | Temp 97.9°F | Resp 16 | Ht 70.0 in | Wt 218.0 lb

## 2023-12-15 DIAGNOSIS — Z8616 Personal history of COVID-19: Secondary | ICD-10-CM | POA: Diagnosis not present

## 2023-12-15 DIAGNOSIS — I10 Essential (primary) hypertension: Secondary | ICD-10-CM | POA: Diagnosis not present

## 2023-12-15 DIAGNOSIS — I480 Paroxysmal atrial fibrillation: Secondary | ICD-10-CM | POA: Insufficient documentation

## 2023-12-15 DIAGNOSIS — Z01818 Encounter for other preprocedural examination: Secondary | ICD-10-CM

## 2023-12-15 DIAGNOSIS — R7303 Prediabetes: Secondary | ICD-10-CM | POA: Diagnosis not present

## 2023-12-15 DIAGNOSIS — H8101 Meniere's disease, right ear: Secondary | ICD-10-CM | POA: Insufficient documentation

## 2023-12-15 DIAGNOSIS — M1711 Unilateral primary osteoarthritis, right knee: Secondary | ICD-10-CM | POA: Insufficient documentation

## 2023-12-15 DIAGNOSIS — I4891 Unspecified atrial fibrillation: Secondary | ICD-10-CM

## 2023-12-15 DIAGNOSIS — Z01812 Encounter for preprocedural laboratory examination: Secondary | ICD-10-CM | POA: Diagnosis not present

## 2023-12-15 HISTORY — DX: Meniere's disease, right ear: H81.01

## 2023-12-15 HISTORY — DX: Prediabetes: R73.03

## 2023-12-15 HISTORY — DX: Cardiac arrhythmia, unspecified: I49.9

## 2023-12-15 HISTORY — DX: Anxiety disorder, unspecified: F41.9

## 2023-12-15 HISTORY — DX: Essential (primary) hypertension: I10

## 2023-12-15 HISTORY — DX: Pneumonia, unspecified organism: J18.9

## 2023-12-15 HISTORY — DX: Unspecified osteoarthritis, unspecified site: M19.90

## 2023-12-15 LAB — CBC
HCT: 45.5 % (ref 36.0–46.0)
Hemoglobin: 14.7 g/dL (ref 12.0–15.0)
MCH: 30.1 pg (ref 26.0–34.0)
MCHC: 32.3 g/dL (ref 30.0–36.0)
MCV: 93.2 fL (ref 80.0–100.0)
Platelets: 268 K/uL (ref 150–400)
RBC: 4.88 MIL/uL (ref 3.87–5.11)
RDW: 12.3 % (ref 11.5–15.5)
WBC: 5.2 K/uL (ref 4.0–10.5)
nRBC: 0 % (ref 0.0–0.2)

## 2023-12-15 LAB — BASIC METABOLIC PANEL WITH GFR
Anion gap: 7 (ref 5–15)
BUN: 17 mg/dL (ref 8–23)
CO2: 27 mmol/L (ref 22–32)
Calcium: 9.5 mg/dL (ref 8.9–10.3)
Chloride: 105 mmol/L (ref 98–111)
Creatinine, Ser: 0.9 mg/dL (ref 0.44–1.00)
GFR, Estimated: >60 mL/min (ref >60)
Glucose, Bld: 107 mg/dL — ABNORMAL HIGH (ref 70–99)
Potassium: 4.3 mmol/L (ref 3.5–5.1)
Sodium: 139 mmol/L (ref 135–145)

## 2023-12-15 LAB — SURGICAL PCR SCREEN
MRSA, PCR: NEGATIVE
Staphylococcus aureus: NEGATIVE

## 2023-12-16 ENCOUNTER — Encounter (HOSPITAL_COMMUNITY): Payer: Self-pay

## 2023-12-16 NOTE — Anesthesia Preprocedure Evaluation (Addendum)
 Anesthesia Evaluation  Patient identified by MRN, date of birth, ID band Patient awake    Reviewed: Allergy & Precautions, NPO status , Patient's Chart, lab work & pertinent test results  History of Anesthesia Complications Negative for: history of anesthetic complications  Airway Mallampati: II  TM Distance: >3 FB Neck ROM: Full    Dental no notable dental hx. (+) Teeth Intact   Pulmonary neg pulmonary ROS, neg sleep apnea, neg COPD, Patient abstained from smoking.Not current smoker   Pulmonary exam normal breath sounds clear to auscultation       Cardiovascular Exercise Tolerance: Good METShypertension, Pt. on medications (-) CAD and (-) Past MI (-) dysrhythmias  Rhythm:Regular Rate:Normal - Systolic murmurs Remote hx of afib.   Neuro/Psych  PSYCHIATRIC DISORDERS Anxiety     negative neurological ROS     GI/Hepatic ,GERD  Medicated,,(+)     (-) substance abuse    Endo/Other  neg diabetes    Renal/GU negative Renal ROS     Musculoskeletal   Abdominal   Peds  Hematology   Anesthesia Other Findings Past Medical History: No date: Anxiety No date: Arthritis No date: Diverticulitis No date: Dyspnea     Comment:  walking up hill No date: Dysrhythmia     Comment:  PAF No date: Hypertension No date: Meniere disease of right ear No date: Pneumonia No date: Pre-diabetes  Reproductive/Obstetrics                              Anesthesia Physical Anesthesia Plan  ASA: 2  Anesthesia Plan: Spinal   Post-op Pain Management: Regional block* and Tylenol  PO (pre-op)*   Induction: Intravenous  PONV Risk Score and Plan: 1 and Ondansetron , Dexamethasone , Propofol  infusion, TIVA and Midazolam   Airway Management Planned: Natural Airway  Additional Equipment: None  Intra-op Plan:   Post-operative Plan:   Informed Consent: I have reviewed the patients History and Physical, chart, labs  and discussed the procedure including the risks, benefits and alternatives for the proposed anesthesia with the patient or authorized representative who has indicated his/her understanding and acceptance.       Plan Discussed with: CRNA and Surgeon  Anesthesia Plan Comments: (Discussed R/B/A of neuraxial anesthesia technique with patient: - rare risks of spinal/epidural hematoma, nerve damage, infection - Risk of PDPH - Risk of nausea and vomiting - Risk of conversion to general anesthesia and its associated risks, including sore throat, damage to lips/eyes/teeth/oropharynx, and rare risks such as cardiac and respiratory events. - Risk of allergic reactions  Discussed r/b/a of adductor canal nerve block, including:  - bleeding, infection, nerve damage - poor or non functioning block. - reactions and toxicity to local anesthetic Patient understands.   Discussed the role of CRNA in patient's perioperative care.  Patient voiced understanding.)         Anesthesia Quick Evaluation

## 2023-12-16 NOTE — Progress Notes (Signed)
 Case: [id number] Date/Time: 12/25/23 0700   Procedure: ARTHROPLASTY, KNEE, TOTAL (Right: Knee)   Anesthesia type: Choice   Diagnosis: Primary osteoarthritis of right knee [M17.11]   Pre-op diagnosis: Primary osteoarthritis of right knee   Location: [facility] ROOM 06 / [facility]   Surgeons: Kay Kemps, MD       DISCUSSION: Dawn Haley is a 68 yo female with PMH of HTN, A.fib (not anticoagulated), DOE, Meniere's disease, prediabetes, arthritis, anxiety.  Patient follows with Cardiology for hx of PAF in the setting of COVID infection. She is not anticoagulated since she only had one episode with Chads vasc score of 2. She has not had recurrence. She had tele visit for clearance on 10/21/23.She denied any cardiac symptoms and was cleared:  Preoperative Cardiovascular Risk Assessment: According to the Revised Cardiac Risk Index (RCRI), her Perioperative Risk of Major Cardiac Event is (%): 0.4. Her Functional Capacity in METs is: 7.59 according to the Duke Activity Status Index (DASI). The patient is doing well from a cardiac perspective. Therefore, based on ACC/AHA guidelines, the patient would be at acceptable risk for the planned procedure without further cardiovascular testing.  Seen by PCP on 10/18/33 for pre op clearance. Medically cleared as low risk and medically optimized.  VS: BP 122/68 Comment: right arm sitting  Pulse (!) 55   Temp 36.6 C (Oral)   Resp 16   Ht 5' 10 (1.778 m)   Wt 98.9 kg   SpO2 97%   BMI 31.28 kg/m   PROVIDERS: Dayna Motto, DO   LABS: Labs reviewed: Acceptable for surgery. (all labs ordered are listed, but only abnormal results are displayed)  Labs Reviewed  BASIC METABOLIC PANEL WITH GFR - Abnormal; Notable for the following components:      Result Value   Glucose, Bld 107 (*)    All other components within normal limits  SURGICAL PCR SCREEN  CBC     IMAGES:   EKG:   CV:  CT Calcium  score 07/30/23:   IMPRESSION: Coronary calcium  score of  0.  Echo 02/21/2022:  IMPRESSIONS    1. Left ventricular ejection fraction, by estimation, is 60 to 65%. The left ventricle has normal function. The left ventricle has no regional wall motion abnormalities. Left ventricular diastolic parameters were normal.  2. Right ventricular systolic function is normal. The right ventricular size is normal. Tricuspid regurgitation signal is inadequate for assessing PA pressure.  3. The mitral valve is normal in structure. No evidence of mitral valve regurgitation.  4. The aortic valve is tricuspid. Aortic valve regurgitation is not visualized. No aortic stenosis is present. Past Medical History:  Diagnosis Date   Anxiety    Arthritis    Diverticulitis    Dyspnea    walking up hill   Dysrhythmia    PAF   Hypertension    Meniere disease of right ear    Pneumonia    Pre-diabetes     Past Surgical History:  Procedure Laterality Date   ABDOMINAL HYSTERECTOMY     APPENDECTOMY     COLON SURGERY  2022   Colectomy for colovaginal fistula & diverticulitis   CYST EXCISION Right 11/02/2015   Procedure: RIGHT INDEX EXCISION MASS ;  Surgeon: Franky Curia, MD;  Location: [facility];  Service: Orthopedics;  Laterality: Right;   CYSTOSCOPY Bilateral 11/09/2019   Procedure: CYSTOSCOPY WITH BILATERAL FIREFLY  INJECTION;  Surgeon: Devere Lonni Righter, MD;  Location: [facility];  Service: Urology;  Laterality: Bilateral;   DIAGNOSTIC  LAPAROSCOPY     GYN   DILATION AND CURETTAGE OF UTERUS     TONSILLECTOMY     URETHRAL DILATION     WISDOM TOOTH EXTRACTION      MEDICATIONS:  ASHWAGANDHA PO   Black Pepper-Turmeric (TURMERIC PLUS BLACK PEPPER EXT PO)   Calcium -Magnesium-Zinc-Vit D3 (CALCIUM -MAGNESIUM-ZINC-D3 PO)   chlorthalidone (HYGROTON) 25 MG tablet   GARLIC PO   LORazepam  (ATIVAN ) 0.5 MG tablet   meloxicam (MOBIC) 15 MG tablet   metoprolol  tartrate (LOPRESSOR ) 25 MG tablet   Misc Natural Products (GLUCOSAMINE CHOND MSM  FORMULA) TABS   Multiple Vitamin (MULTIVITAMIN WITH MINERALS) TABS tablet   NON FORMULARY   Omega-3 Fatty Acids (FISH OIL) 1200 MG CAPS   OVER THE COUNTER MEDICATION   potassium chloride  (KLOR-CON ) 10 MEQ tablet   Probiotic Product (PROBIOTIC PO)   Ubiquinol 100 MG CAPS   VITAMIN D-VITAMIN K PO   No current facility-administered medications for this encounter.    Burnard CHRISTELLA Odis DEVONNA [facility] Short Stay/Anesthesiology [facility] Phone [phone] 12/16/2023 1:35 PM

## 2023-12-25 ENCOUNTER — Other Ambulatory Visit: Payer: Self-pay

## 2023-12-25 ENCOUNTER — Ambulatory Visit (HOSPITAL_COMMUNITY): Admitting: Registered Nurse

## 2023-12-25 ENCOUNTER — Encounter (HOSPITAL_COMMUNITY): Payer: Self-pay | Admitting: Orthopedic Surgery

## 2023-12-25 ENCOUNTER — Encounter (HOSPITAL_COMMUNITY)
Admission: RE | Disposition: A | Discharge status: Home / Self Care | Payer: Self-pay | Source: Ambulatory Visit | Attending: Orthopedic Surgery

## 2023-12-25 ENCOUNTER — Ambulatory Visit (HOSPITAL_COMMUNITY): Payer: Self-pay | Admitting: Medical

## 2023-12-25 ENCOUNTER — Observation Stay (HOSPITAL_COMMUNITY)
Admission: RE | Admit: 2023-12-25 | Discharge: 2023-12-27 | Disposition: A | Discharge status: Home / Self Care | Source: Ambulatory Visit | Attending: Orthopedic Surgery | Admitting: Orthopedic Surgery

## 2023-12-25 DIAGNOSIS — I10 Essential (primary) hypertension: Secondary | ICD-10-CM | POA: Insufficient documentation

## 2023-12-25 DIAGNOSIS — Z96659 Presence of unspecified artificial knee joint: Principal | ICD-10-CM

## 2023-12-25 DIAGNOSIS — M1711 Unilateral primary osteoarthritis, right knee: Principal | ICD-10-CM | POA: Insufficient documentation

## 2023-12-25 DIAGNOSIS — F1090 Alcohol use, unspecified, uncomplicated: Secondary | ICD-10-CM | POA: Diagnosis not present

## 2023-12-25 DIAGNOSIS — G8918 Other acute postprocedural pain: Secondary | ICD-10-CM | POA: Diagnosis not present

## 2023-12-25 DIAGNOSIS — Z7982 Long term (current) use of aspirin: Secondary | ICD-10-CM | POA: Diagnosis not present

## 2023-12-25 SURGERY — ARTHROPLASTY, KNEE, TOTAL
Anesthesia: Spinal | Site: Knee | Laterality: Right

## 2023-12-25 MED ORDER — ACETAMINOPHEN 10 MG/ML IV SOLN
INTRAVENOUS | Status: AC
  Filled 2023-12-25: qty 100

## 2023-12-25 MED ORDER — PROBIOTIC 250 MG PO CAPS: ORAL_CAPSULE | Freq: Every day | ORAL | Status: DC

## 2023-12-25 MED ORDER — ONDANSETRON HCL 4 MG/2ML IJ SOLN: 4.0000 mg | Freq: Four times a day (QID) | INTRAMUSCULAR | Status: DC | PRN

## 2023-12-25 MED ORDER — FENTANYL CITRATE (PF) 100 MCG/2ML IJ SOLN
INTRAMUSCULAR | Status: AC
  Filled 2023-12-25: qty 2

## 2023-12-25 MED ORDER — SODIUM CHLORIDE 0.9 % IR SOLN
Status: DC | PRN
  Administered 2023-12-25: 1000 mL

## 2023-12-25 MED ORDER — HYDROMORPHONE HCL 1 MG/ML IJ SOLN
INTRAMUSCULAR | Status: AC
  Filled 2023-12-25: qty 1

## 2023-12-25 MED ORDER — ACETAMINOPHEN 500 MG PO TABS: 1000.0000 mg | ORAL_TABLET | Freq: Once | ORAL | Status: DC

## 2023-12-25 MED ORDER — HYDROMORPHONE HCL 1 MG/ML IJ SOLN
0.5000 mg | INTRAMUSCULAR | Status: DC | PRN
  Administered 2023-12-25: 1 mg via INTRAVENOUS
  Administered 2023-12-25: 0.5 mg via INTRAVENOUS
  Administered 2023-12-26: 1 mg via INTRAVENOUS
  Filled 2023-12-25 (×2): qty 1

## 2023-12-25 MED ORDER — ORAL CARE MOUTH RINSE: 15.0000 mL | Freq: Once | OROMUCOSAL | Status: AC

## 2023-12-25 MED ORDER — ONDANSETRON HCL 4 MG PO TABS: 4.0000 mg | ORAL_TABLET | Freq: Three times a day (TID) | ORAL | 1 refills | Status: AC | PRN

## 2023-12-25 MED ORDER — TRANEXAMIC ACID-NACL 1000-0.7 MG/100ML-% IV SOLN
INTRAVENOUS | Status: AC
  Filled 2023-12-25: qty 100

## 2023-12-25 MED ORDER — OXYCODONE HCL 5 MG/5ML PO SOLN: 5.0000 mg | Freq: Once | ORAL | Status: AC | PRN

## 2023-12-25 MED ORDER — WATER FOR IRRIGATION, STERILE IR SOLN
Status: DC | PRN
  Administered 2023-12-25: 2000 mL

## 2023-12-25 MED ORDER — PHENYLEPHRINE HCL-NACL 20-0.9 MG/250ML-% IV SOLN
INTRAVENOUS | Status: DC | PRN
Start: 2023-12-25 — End: 2023-12-25
  Administered 2023-12-25: 30 ug/min via INTRAVENOUS

## 2023-12-25 MED ORDER — OXYCODONE HCL 5 MG PO TABS
ORAL_TABLET | ORAL | Status: AC
  Filled 2023-12-25: qty 1

## 2023-12-25 MED ORDER — CHLORTHALIDONE 25 MG PO TABS
25.0000 mg | ORAL_TABLET | ORAL | Status: DC
  Filled 2023-12-25: qty 1

## 2023-12-25 MED ORDER — 0.9 % SODIUM CHLORIDE (POUR BTL) OPTIME
TOPICAL | Status: DC | PRN
  Administered 2023-12-25: 1000 mL

## 2023-12-25 MED ORDER — FENTANYL CITRATE (PF) 100 MCG/2ML IJ SOLN
INTRAMUSCULAR | Status: DC | PRN
  Administered 2023-12-25 (×2): 50 ug via INTRAVENOUS

## 2023-12-25 MED ORDER — CHLORHEXIDINE GLUCONATE 0.12 % MT SOLN
15.0000 mL | Freq: Once | OROMUCOSAL | Status: AC
  Administered 2023-12-25: 15 mL via OROMUCOSAL

## 2023-12-25 MED ORDER — ADULT MULTIVITAMIN W/MINERALS CH
1.0000 | ORAL_TABLET | Freq: Every day | ORAL | Status: DC
  Administered 2023-12-25 – 2023-12-27 (×3): 1 via ORAL
  Filled 2023-12-25 (×3): qty 1

## 2023-12-25 MED ORDER — FENTANYL CITRATE PF 50 MCG/ML IJ SOSY
PREFILLED_SYRINGE | INTRAMUSCULAR | Status: AC
  Filled 2023-12-25: qty 1

## 2023-12-25 MED ORDER — GLUCOSAMINE CHOND MSM FORMULA PO TABS: 1.0000 | ORAL_TABLET | Freq: Every day | ORAL | Status: DC

## 2023-12-25 MED ORDER — ACETAMINOPHEN 10 MG/ML IV SOLN
1000.0000 mg | Freq: Once | INTRAVENOUS | Status: DC | PRN
  Administered 2023-12-25: 1000 mg via INTRAVENOUS

## 2023-12-25 MED ORDER — UBIQUINOL 100 MG PO CAPS: 100.0000 mg | ORAL_CAPSULE | ORAL | Status: DC

## 2023-12-25 MED ORDER — ASPIRIN 81 MG PO CHEW: 81.0000 mg | CHEWABLE_TABLET | Freq: Two times a day (BID) | ORAL | 0 refills | Status: AC

## 2023-12-25 MED ORDER — DOCUSATE SODIUM 100 MG PO CAPS
100.0000 mg | ORAL_CAPSULE | Freq: Two times a day (BID) | ORAL | Status: DC
  Administered 2023-12-26 – 2023-12-27 (×3): 100 mg via ORAL
  Filled 2023-12-25 (×3): qty 1

## 2023-12-25 MED ORDER — TRANEXAMIC ACID-NACL 1000-0.7 MG/100ML-% IV SOLN
1000.0000 mg | INTRAVENOUS | Status: AC
  Administered 2023-12-25: 1000 mg via INTRAVENOUS
  Filled 2023-12-25: qty 100

## 2023-12-25 MED ORDER — LACTATED RINGERS IV SOLN: INTRAVENOUS | Status: DC

## 2023-12-25 MED ORDER — RISAQUAD PO CAPS
1.0000 | ORAL_CAPSULE | Freq: Every day | ORAL | Status: DC
  Administered 2023-12-26 – 2023-12-27 (×2): 1 via ORAL
  Filled 2023-12-25 (×2): qty 1

## 2023-12-25 MED ORDER — OMEGA-3-ACID ETHYL ESTERS 1 G PO CAPS
2.0000 g | ORAL_CAPSULE | Freq: Two times a day (BID) | ORAL | Status: DC
  Administered 2023-12-25 – 2023-12-27 (×4): 2 g via ORAL
  Filled 2023-12-25 (×5): qty 2

## 2023-12-25 MED ORDER — SODIUM CHLORIDE 0.9 % IV SOLN: INTRAVENOUS | Status: AC

## 2023-12-25 MED ORDER — VITAMIN K2-VITAMIN D3 90-125 MCG PO CAPS: 1.0000 | ORAL_CAPSULE | Freq: Every day | ORAL | Status: DC

## 2023-12-25 MED ORDER — BUPIVACAINE-EPINEPHRINE (PF) 0.25% -1:200000 IJ SOLN
INTRAMUSCULAR | Status: DC | PRN
  Administered 2023-12-25: 20 mL via PERINEURAL

## 2023-12-25 MED ORDER — LORAZEPAM 0.5 MG PO TABS
0.5000 mg | ORAL_TABLET | Freq: Every evening | ORAL | Status: DC | PRN
  Administered 2023-12-27: 0.5 mg via ORAL
  Filled 2023-12-25: qty 1

## 2023-12-25 MED ORDER — ACETAMINOPHEN 325 MG PO TABS
325.0000 mg | ORAL_TABLET | Freq: Four times a day (QID) | ORAL | Status: DC | PRN
  Filled 2023-12-25 (×2): qty 2

## 2023-12-25 MED ORDER — ONDANSETRON HCL 4 MG/2ML IJ SOLN
INTRAMUSCULAR | Status: AC
  Filled 2023-12-25: qty 2

## 2023-12-25 MED ORDER — TRANEXAMIC ACID-NACL 1000-0.7 MG/100ML-% IV SOLN
1000.0000 mg | Freq: Once | INTRAVENOUS | Status: AC
  Administered 2023-12-25: 1000 mg via INTRAVENOUS

## 2023-12-25 MED ORDER — METOCLOPRAMIDE HCL 5 MG/ML IJ SOLN: 5.0000 mg | Freq: Three times a day (TID) | INTRAMUSCULAR | Status: DC | PRN

## 2023-12-25 MED ORDER — FENTANYL CITRATE PF 50 MCG/ML IJ SOSY
25.0000 ug | PREFILLED_SYRINGE | INTRAMUSCULAR | Status: DC | PRN
  Administered 2023-12-25: 50 ug via INTRAVENOUS

## 2023-12-25 MED ORDER — SODIUM CHLORIDE (PF) 0.9 % IJ SOLN
INTRAMUSCULAR | Status: AC
  Filled 2023-12-25: qty 30

## 2023-12-25 MED ORDER — CEFAZOLIN SODIUM-DEXTROSE 2-4 GM/100ML-% IV SOLN
2.0000 g | INTRAVENOUS | Status: AC
  Administered 2023-12-25: 2 g via INTRAVENOUS
  Filled 2023-12-25: qty 100

## 2023-12-25 MED ORDER — ASPIRIN 81 MG PO CHEW
81.0000 mg | CHEWABLE_TABLET | Freq: Two times a day (BID) | ORAL | Status: DC
  Administered 2023-12-25 – 2023-12-27 (×4): 81 mg via ORAL
  Filled 2023-12-25 (×4): qty 1

## 2023-12-25 MED ORDER — PHENOL 1.4 % MT LIQD: 1.0000 | OROMUCOSAL | Status: DC | PRN

## 2023-12-25 MED ORDER — BUPIVACAINE LIPOSOME 1.3 % IJ SUSP: 20.0000 mL | Freq: Once | INTRAMUSCULAR | Status: DC

## 2023-12-25 MED ORDER — MIDAZOLAM HCL 5 MG/5ML IJ SOLN
INTRAMUSCULAR | Status: DC | PRN
  Administered 2023-12-25: 2 mg via INTRAVENOUS

## 2023-12-25 MED ORDER — BUPIVACAINE LIPOSOME 1.3 % IJ SUSP
INTRAMUSCULAR | Status: AC
  Filled 2023-12-25: qty 20

## 2023-12-25 MED ORDER — ONDANSETRON HCL 4 MG PO TABS: 4.0000 mg | ORAL_TABLET | Freq: Four times a day (QID) | ORAL | Status: DC | PRN

## 2023-12-25 MED ORDER — MENTHOL 3 MG MT LOZG: 1.0000 | LOZENGE | OROMUCOSAL | Status: DC | PRN

## 2023-12-25 MED ORDER — DEXAMETHASONE SODIUM PHOSPHATE 10 MG/ML IJ SOLN
INTRAMUSCULAR | Status: AC
  Filled 2023-12-25: qty 1

## 2023-12-25 MED ORDER — HYDROMORPHONE HCL 2 MG PO TABS
2.0000 mg | ORAL_TABLET | Freq: Four times a day (QID) | ORAL | Status: DC | PRN
  Administered 2023-12-25 – 2023-12-27 (×7): 2 mg via ORAL
  Filled 2023-12-25 (×8): qty 1

## 2023-12-25 MED ORDER — ONDANSETRON HCL 4 MG/2ML IJ SOLN: 4.0000 mg | Freq: Once | INTRAMUSCULAR | Status: DC | PRN

## 2023-12-25 MED ORDER — VITAMIN D-VITAMIN K 20-120 MCG/0.1ML PO LIQD: Freq: Every day | ORAL | Status: DC

## 2023-12-25 MED ORDER — HYDROMORPHONE HCL 2 MG PO TABS: 2.0000 mg | ORAL_TABLET | Freq: Two times a day (BID) | ORAL | 0 refills | Status: AC | PRN

## 2023-12-25 MED ORDER — ONDANSETRON HCL 4 MG/2ML IJ SOLN
INTRAMUSCULAR | Status: DC | PRN
  Administered 2023-12-25: 4 mg via INTRAVENOUS

## 2023-12-25 MED ORDER — METHOCARBAMOL 500 MG PO TABS
ORAL_TABLET | ORAL | Status: AC
  Filled 2023-12-25: qty 1

## 2023-12-25 MED ORDER — DEXAMETHASONE SODIUM PHOSPHATE 10 MG/ML IJ SOLN
INTRAMUSCULAR | Status: DC | PRN
  Administered 2023-12-25: 10 mg via INTRAVENOUS

## 2023-12-25 MED ORDER — METHOCARBAMOL 500 MG PO TABS: 500.0000 mg | ORAL_TABLET | Freq: Three times a day (TID) | ORAL | 1 refills | Status: AC | PRN

## 2023-12-25 MED ORDER — NAPROXEN 250 MG PO TABS
250.0000 mg | ORAL_TABLET | Freq: Three times a day (TID) | ORAL | Status: DC
  Administered 2023-12-26 – 2023-12-27 (×3): 250 mg via ORAL
  Filled 2023-12-25 (×4): qty 1

## 2023-12-25 MED ORDER — METHOCARBAMOL 1000 MG/10ML IJ SOLN: 500.0000 mg | Freq: Four times a day (QID) | INTRAMUSCULAR | Status: DC | PRN

## 2023-12-25 MED ORDER — POVIDONE-IODINE 10 % EX SWAB: 2.0000 | Freq: Once | CUTANEOUS | Status: DC

## 2023-12-25 MED ORDER — METOPROLOL TARTRATE 25 MG PO TABS
25.0000 mg | ORAL_TABLET | Freq: Two times a day (BID) | ORAL | Status: DC
  Filled 2023-12-25 (×3): qty 1

## 2023-12-25 MED ORDER — MIDAZOLAM HCL 2 MG/2ML IJ SOLN
INTRAMUSCULAR | Status: AC
  Filled 2023-12-25: qty 2

## 2023-12-25 MED ORDER — CHLORTHALIDONE 25 MG PO TABS
25.0000 mg | ORAL_TABLET | ORAL | Status: DC
  Administered 2023-12-26: 25 mg via ORAL
  Filled 2023-12-25: qty 1

## 2023-12-25 MED ORDER — BUPIVACAINE-EPINEPHRINE (PF) 0.25% -1:200000 IJ SOLN
INTRAMUSCULAR | Status: AC
  Filled 2023-12-25: qty 30

## 2023-12-25 MED ORDER — BUPIVACAINE-EPINEPHRINE (PF) 0.25% -1:200000 IJ SOLN
INTRAMUSCULAR | Status: DC | PRN
  Administered 2023-12-25: 80 mL

## 2023-12-25 MED ORDER — POTASSIUM CHLORIDE ER 10 MEQ PO TBCR: 10.0000 meq | EXTENDED_RELEASE_TABLET | ORAL | Status: DC

## 2023-12-25 MED ORDER — METOCLOPRAMIDE HCL 5 MG PO TABS: 5.0000 mg | ORAL_TABLET | Freq: Three times a day (TID) | ORAL | Status: DC | PRN

## 2023-12-25 MED ORDER — OXYCODONE HCL 5 MG PO TABS
5.0000 mg | ORAL_TABLET | Freq: Once | ORAL | Status: AC | PRN
  Administered 2023-12-25: 5 mg via ORAL

## 2023-12-25 MED ORDER — PROPOFOL 500 MG/50ML IV EMUL
INTRAVENOUS | Status: DC | PRN
  Administered 2023-12-25: 50 ug/kg/min via INTRAVENOUS
  Administered 2023-12-25 (×2): 20 mg via INTRAVENOUS

## 2023-12-25 MED ORDER — BUPIVACAINE IN DEXTROSE 0.75-8.25 % IT SOLN
INTRATHECAL | Status: DC | PRN
Start: 2023-12-25 — End: 2023-12-25
  Administered 2023-12-25: 1.8 mL via INTRATHECAL

## 2023-12-25 MED ORDER — METHOCARBAMOL 500 MG PO TABS
500.0000 mg | ORAL_TABLET | Freq: Four times a day (QID) | ORAL | Status: DC | PRN
  Administered 2023-12-25 – 2023-12-27 (×8): 500 mg via ORAL
  Filled 2023-12-25 (×7): qty 1

## 2023-12-25 SURGICAL SUPPLY — 44 items
ATTUNE MED DOME PAT 38 KNEE (Knees) IMPLANT
ATTUNE PS FEM RT SZ 7 CEM KNEE (Femur) IMPLANT
ATTUNE PSRP INSR SZ7 5 KNEE (Insert) IMPLANT
BAG COUNTER SPONGE SURGICOUNT (BAG) IMPLANT
BAG ZIPLOCK 12X15 (MISCELLANEOUS) ×1 IMPLANT
BASE TIBIAL ROT PLAT SZ 7 KNEE (Knees) IMPLANT
BLADE SAG 18X100X1.27 (BLADE) ×1 IMPLANT
BLADE SAW SGTL 13X75X1.27 (BLADE) ×1 IMPLANT
BNDG ELASTIC 6X10 VLCR STRL LF (GAUZE/BANDAGES/DRESSINGS) ×1 IMPLANT
BNDG GAUZE DERMACEA FLUFF 4 (GAUZE/BANDAGES/DRESSINGS) ×1 IMPLANT
BOWL SMART MIX CTS (DISPOSABLE) ×1 IMPLANT
CEMENT HV SMART SET (Cement) ×2 IMPLANT
COVER SURGICAL LIGHT HANDLE (MISCELLANEOUS) ×1 IMPLANT
CUFF TRNQT CYL 34X4.125X (TOURNIQUET CUFF) ×1 IMPLANT
DRAPE SHEET LG 3/4 BI-LAMINATE (DRAPES) ×1 IMPLANT
DRAPE U-SHAPE 47X51 STRL (DRAPES) ×1 IMPLANT
DRSG ADAPTIC 3X8 NADH LF (GAUZE/BANDAGES/DRESSINGS) ×1 IMPLANT
DURAPREP 26ML APPLICATOR (WOUND CARE) ×1 IMPLANT
ELECT PENCIL ROCKER SW 15FT (MISCELLANEOUS) ×1 IMPLANT
ELECT REM PT RETURN 15FT ADLT (MISCELLANEOUS) ×1 IMPLANT
GAUZE PAD ABD 8X10 STRL (GAUZE/BANDAGES/DRESSINGS) ×1 IMPLANT
GAUZE SPONGE 4X4 12PLY STRL (GAUZE/BANDAGES/DRESSINGS) ×1 IMPLANT
GLOVE BIOGEL PI IND STRL 7.5 (GLOVE) ×1 IMPLANT
GLOVE BIOGEL PI IND STRL 8.5 (GLOVE) ×1 IMPLANT
GLOVE ORTHO TXT STRL SZ7.5 (GLOVE) ×1 IMPLANT
GLOVE SURG ORTHO 8.5 STRL (GLOVE) ×1 IMPLANT
GOWN STRL REUS W/ TWL XL LVL3 (GOWN DISPOSABLE) ×2 IMPLANT
IMMOBILIZER KNEE 20 THIGH 36 (SOFTGOODS) ×1 IMPLANT
KIT TURNOVER KIT A (KITS) ×1 IMPLANT
MANIFOLD NEPTUNE II (INSTRUMENTS) ×1 IMPLANT
NS IRRIG 1000ML POUR BTL (IV SOLUTION) ×1 IMPLANT
PACK TOTAL KNEE CUSTOM (KITS) ×1 IMPLANT
PIN STEINMAN FIXATION KNEE (PIN) IMPLANT
PROTECTOR NERVE ULNAR (MISCELLANEOUS) ×1 IMPLANT
SET HNDPC FAN SPRY TIP SCT (DISPOSABLE) ×1 IMPLANT
STRIP CLOSURE SKIN 1/2X4 (GAUZE/BANDAGES/DRESSINGS) ×2 IMPLANT
SUT MNCRL AB 3-0 PS2 18 (SUTURE) ×1 IMPLANT
SUT VIC AB 0 CT1 36 (SUTURE) ×1 IMPLANT
SUT VIC AB 1 CT1 36 (SUTURE) ×2 IMPLANT
SUT VIC AB 2-0 CT1 TAPERPNT 27 (SUTURE) ×1 IMPLANT
TOWEL GREEN STERILE FF (TOWEL DISPOSABLE) ×1 IMPLANT
TRAY CATH INTERMITTENT SS 16FR (CATHETERS) ×1 IMPLANT
WATER STERILE IRR 1000ML POUR (IV SOLUTION) ×2 IMPLANT
YANKAUER SUCT BULB TIP NO VENT (SUCTIONS) ×1 IMPLANT

## 2023-12-25 NOTE — Transfer of Care (Signed)
 Immediate Anesthesia Transfer of Care Note  Patient: Dawn Haley  Procedure(s) Performed: ARTHROPLASTY, KNEE, TOTAL (Right: Knee)  Patient Location: PACU  Anesthesia Type:MAC combined with regional for post-op pain  Level of Consciousness: awake, alert , and oriented  Airway & Oxygen Therapy: Patient Spontanous Breathing and Patient connected to nasal cannula oxygen  Post-op Assessment: Report given to RN and Post -op Vital signs reviewed and stable  Post vital signs: Reviewed and stable  Last Vitals:  Vitals Value Taken Time  BP 117/54 12/25/23 09:12  Temp    Pulse 79 12/25/23 09:13  Resp 12 12/25/23 09:11  SpO2 99 % 12/25/23 09:13  Vitals shown include unfiled device data.  Last Pain:  Vitals:   12/25/23 0545  TempSrc:   PainSc: 0-No pain         Complications: No notable events documented.

## 2023-12-25 NOTE — Anesthesia Postprocedure Evaluation (Signed)
 Anesthesia Post Note  Patient: Dawn Haley  Procedure(s) Performed: ARTHROPLASTY, KNEE, TOTAL (Right: Knee)     Patient location during evaluation: PACU Anesthesia Type: Spinal Level of consciousness: oriented and awake and alert Pain management: pain level controlled Vital Signs Assessment: post-procedure vital signs reviewed and stable Respiratory status: spontaneous breathing, respiratory function stable and patient connected to nasal cannula oxygen Cardiovascular status: blood pressure returned to baseline and stable Postop Assessment: no headache, no backache, no apparent nausea or vomiting and spinal receding Anesthetic complications: no   No notable events documented.  Last Vitals:  Vitals:   12/25/23 1030 12/25/23 1044  BP: 124/72 (!) 123/90  Pulse: 65 69  Resp: 16 20  Temp:  (!) 36.2 C  SpO2: 98% 99%    Last Pain:  Vitals:   12/25/23 1044  TempSrc: Axillary  PainSc: 0-No pain                 Rome Ade

## 2023-12-25 NOTE — Anesthesia Procedure Notes (Signed)
 Spinal  Patient location during procedure: OR Start time: 12/25/2023 7:28 AM End time: 12/25/2023 7:31 AM Reason for block: surgical anesthesia Staffing Performed: anesthesiologist  Anesthesiologist: Boone Fess, MD Performed by: Boone Fess, MD Authorized by: Boone Fess, MD   Preanesthetic Checklist Completed: patient identified, IV checked, site marked, risks and benefits discussed, surgical consent, monitors and equipment checked, pre-op evaluation and timeout performed Spinal Block Patient position: sitting Prep: ChloraPrep and site prepped and draped Patient monitoring: heart rate, continuous pulse ox, blood pressure and cardiac monitor Approach: midline Location: L3-4 Injection technique: single-shot Needle Needle type: Whitacre and Introducer  Needle gauge: 24 G Needle length: 9 cm Assessment Sensory level: T10 Events: CSF return Additional Notes Meticulous sterile technique used throughout (CHG prep, sterile gloves, sterile drape). Negative paresthesia. Negative blood return. Positive free-flowing CSF. Expiration date of kit checked and confirmed. Patient tolerated procedure well, without complications.

## 2023-12-25 NOTE — Addendum Note (Signed)
 Addendum  created 12/25/23 1057 by Boone Fess, MD   Intraprocedure Meds edited

## 2023-12-25 NOTE — Evaluation (Signed)
 Physical Therapy Evaluation Patient Details Name: Dawn Haley MRN: [id number] DOB: July 06, 1955 Today's Date: 12/25/2023  History of Present Illness  68 yo female presents to therapy s/p R TKA on 12/25/2023 due to failure of conservative measures. Pt PMH includes but is not limited to: diverticulitis,  R miniscal tear (2/205), DOE, HA and abdominal surgeries.  Clinical Impression    Dawn Haley is a 68 y.o. female POD 0 s/p R TKA. Patient reports IND with mobility at baseline. Patient is now limited by functional impairments (see PT problem list below). PT guiding pt through R LE TE for HEP in supine, pt reported feeling light headed and as if she was going to pass out. Bp assessed and 84/47 (58 PR) semi reclined and then once in supine 90/50 (63 PR), nursing made aware and suspect pain medication. PT to return later in the day to assess pt readiness and safety for same day d/c.  Patient will benefit from continued skilled PT interventions to address impairments and progress towards PLOF. Acute PT will follow to progress mobility and stair training in preparation for safe discharge home with family support and OPPT services.       If plan is discharge home, recommend the following: A lot of help with walking and/or transfers;A lot of help with bathing/dressing/bathroom;Assist for transportation;Help with stairs or ramp for entrance;Assistance with cooking/housework   Can travel by private vehicle        Equipment Recommendations None recommended by PT  Recommendations for Other Services       Functional Status Assessment Patient has had a recent decline in their functional status and demonstrates the ability to make significant improvements in function in a reasonable and predictable amount of time.     Precautions / Restrictions Precautions Precautions: Knee;Fall Restrictions Weight Bearing Restrictions Per Provider Order: No      Mobility  Bed Mobility                     Transfers                        Ambulation/Gait                  Stairs            Wheelchair Mobility     Tilt Bed    Modified Rankin (Stroke Patients Only)       Balance                                             Pertinent Vitals/Pain Pain Assessment Pain Assessment: 0-10 Pain Score: 8  (pt provided with pain medication just prior to PT arrival.) Pain Location: R knee and LE Pain Descriptors / Indicators: Aching, Constant, Burning, Operative site guarding, Dull, Grimacing Pain Intervention(s): Limited activity within patient's tolerance, Premedicated before session, Monitored during session, Repositioned, Ice applied    Home Living Family/patient expects to be discharged to:: Private residence Living Arrangements: Spouse/significant other Available Help at Discharge: Family Type of Home: House Home Access: Stairs to enter Entrance Stairs-Rails: None Entrance Stairs-Number of Steps: 1   Home Layout: One level Home Equipment: Agricultural consultant (2 wheels);Shower seat;Cane - single point;Crutches      Prior Function Prior Level of Function : Independent/Modified Independent;Driving  Mobility Comments: IND no AD for all ADLs, self care tasks and IADLs       Extremity/Trunk Assessment        Lower Extremity Assessment Lower Extremity Assessment: RLE deficits/detail RLE Deficits / Details: ankle DF/PF 5/5; SLR < 10 degree lag RLE Sensation: WNL    Cervical / Trunk Assessment Cervical / Trunk Assessment: Normal  Communication   Communication Communication: No apparent difficulties    Cognition Arousal: Alert Behavior During Therapy: WFL for tasks assessed/performed   PT - Cognitive impairments: No apparent impairments                         Following commands: Intact       Cueing       General Comments      Exercises Total Joint Exercises Ankle Circles/Pumps: AROM,  Both, 10 reps Quad Sets: AROM, Right, 5 reps Heel Slides: AROM, Right, 5 reps Hip ABduction/ADduction: AROM, Right, 5 reps Straight Leg Raises: AROM, Right, 5 reps   Assessment/Plan    PT Assessment Patient needs continued PT services  PT Problem List Decreased strength;Decreased range of motion;Decreased activity tolerance;Decreased balance;Decreased mobility;Decreased coordination;Pain       PT Treatment Interventions DME instruction;Gait training;Stair training;Functional mobility training;Therapeutic activities;Therapeutic exercise;Balance training;Neuromuscular re-education;Patient/family education;Modalities    PT Goals (Current goals can be found in the Care Plan section)  Acute Rehab PT Goals Patient Stated Goal: back to doing everything without thinking about it PT Goal Formulation: With patient Time For Goal Achievement: 01/08/24 Potential to Achieve Goals: Good    Frequency 7X/week     Co-evaluation               AM-PAC PT 6 Clicks Mobility  Outcome Measure                  End of Session Equipment Utilized During Treatment: Gait belt Activity Tolerance: Patient limited by pain;Treatment limited secondary to medical complications (Comment) (hypotension) Patient left: in bed;with call bell/phone within reach;with nursing/sitter in room;with family/visitor present Nurse Communication: Other (comment) (Bp findings suspect medicaiton) PT Visit Diagnosis: Unsteadiness on feet (R26.81);Other abnormalities of gait and mobility (R26.89);Muscle weakness (generalized) (M62.81);Difficulty in walking, not elsewhere classified (R26.2);Pain Pain - Right/Left: Right Pain - part of body: Knee;Leg    Time: 8884-8858 PT Time Calculation (min) (ACUTE ONLY): 26 min   Charges:   PT Evaluation $PT Eval Low Complexity: 1 Low PT Treatments $Therapeutic Exercise: 8-22 mins PT General Charges $$ ACUTE PT VISIT: 1 Visit         [place], PT Acute  Rehab   [place] VEAR Drone 12/25/2023, 11:47 AM

## 2023-12-25 NOTE — Discharge Instructions (Signed)
 Ice to the knee constantly. Change to the Aquacel bandage on Sunday.  Keep the incision covered and clean and dry for one week,then remove the bandage and leave open to air.   Do exercise as instructed every hour, please to prevent stiffness.    DO NOT prop anything under the knee, it will make your knee stiff.  Prop under the ankle to encourage your knee to go straight.   Use the walker while you are up and around for balance.  Wear your support stockings 24/7 to prevent blood clots and take baby aspirin  twice daily for 30 days also to prevent blood clots  Follow up with Dr Kay in two weeks in the office, call [phone] for appt  Please call Dr Kay (cell) [phone] with any questions or concerns  INSTRUCTIONS AFTER JOINT REPLACEMENT   Remove items at home which could result in a fall. This includes throw rugs or furniture in walking pathways ICE to the affected joint every three hours while awake for 30 minutes at a time, for at least the first 3-5 days, and then as needed for pain and swelling.  Continue to use ice for pain and swelling. You may notice swelling that will progress down to the foot and ankle.  This is normal after surgery.  Elevate your leg when you are not up walking on it.   Continue to use the breathing machine you got in the hospital (incentive spirometer) which will help keep your temperature down.  It is common for your temperature to cycle up and down following surgery, especially at night when you are not up moving around and exerting yourself.  The breathing machine keeps your lungs expanded and your temperature down.   DIET:  As you were doing prior to hospitalization, we recommend a well-balanced diet.  DRESSING / WOUND CARE / SHOWERING  CHange to Aquacel bandage on Sunday and then ok to get it wet in the shower. Leave that one for one week then remove and leave open to air ACTIVITY  Increase activity slowly as tolerated, but follow the weight bearing  instructions below.   No driving for 6 weeks or until further direction given by your physician.  You cannot drive while taking narcotics.  No lifting or carrying greater than 10 lbs. until further directed by your surgeon. Avoid periods of inactivity such as sitting longer than an hour when not asleep. This helps prevent blood clots.  You may return to work once you are authorized by your doctor.     WEIGHT BEARING   Weight bearing as tolerated with assist device (walker, cane, etc) as directed, use it as long as suggested by your surgeon or therapist, typically at least 4-6 weeks.   EXERCISES  Results after joint replacement surgery are often greatly improved when you follow the exercise, range of motion and muscle strengthening exercises prescribed by your doctor. Safety measures are also important to protect the joint from further injury. Any time any of these exercises cause you to have increased pain or swelling, decrease what you are doing until you are comfortable again and then slowly increase them. If you have problems or questions, call your caregiver or physical therapist for advice.   Rehabilitation is important following a joint replacement. After just a few days of immobilization, the muscles of the leg can become weakened and shrink (atrophy).  These exercises are designed to build up the tone and strength of the thigh and leg muscles and  to improve motion. Often times heat used for twenty to thirty minutes before working out will loosen up your tissues and help with improving the range of motion but do not use heat for the first two weeks following surgery (sometimes heat can increase post-operative swelling).   These exercises can be done on a training (exercise) mat, on the floor, on a table or on a bed. Use whatever works the best and is most comfortable for you.    Use music or television while you are exercising so that the exercises are a pleasant break in your day. This  will make your life better with the exercises acting as a break in your routine that you can look forward to.   Perform all exercises about fifteen times, three times per day or as directed.  You should exercise both the operative leg and the other leg as well.  Exercises include:   Quad Sets - Tighten up the muscle on the front of the thigh (Quad) and hold for 5-10 seconds.   Straight Leg Raises - With your knee straight (if you were given a brace, keep it on), lift the leg to 60 degrees, hold for 3 seconds, and slowly lower the leg.  Perform this exercise against resistance later as your leg gets stronger.  Leg Slides: Lying on your back, slowly slide your foot toward your buttocks, bending your knee up off the floor (only go as far as is comfortable). Then slowly slide your foot back down until your leg is flat on the floor again.  Angel Wings: Lying on your back spread your legs to the side as far apart as you can without causing discomfort.  Hamstring Strength:  Lying on your back, push your heel against the floor with your leg straight by tightening up the muscles of your buttocks.  Repeat, but this time bend your knee to a comfortable angle, and push your heel against the floor.  You may put a pillow under the heel to make it more comfortable if necessary.   A rehabilitation program following joint replacement surgery can speed recovery and prevent re-injury in the future due to weakened muscles. Contact your doctor or a physical therapist for more information on knee rehabilitation.    CONSTIPATION  Constipation is defined medically as fewer than three stools per week and severe constipation as less than one stool per week.  Even if you have a regular bowel pattern at home, your normal regimen is likely to be disrupted due to multiple reasons following surgery.  Combination of anesthesia, postoperative narcotics, change in appetite and fluid intake all can affect your bowels.   YOU MUST use  at least one of the following options; they are listed in order of increasing strength to get the job done.  They are all available over the counter, and you may need to use some, POSSIBLY even all of these options:    Drink plenty of fluids (prune juice may be helpful) and high fiber foods Colace 100 mg by mouth twice a day  Senokot for constipation as directed and as needed Dulcolax (bisacodyl), take with full glass of water   Miralax (polyethylene glycol) once or twice a day as needed.  If you have tried all these things and are unable to have a bowel movement in the first 3-4 days after surgery call either your surgeon or your primary doctor.    If you experience loose stools or diarrhea, hold the medications until you stool forms  back up.  If your symptoms do not get better within 1 week or if they get worse, check with your doctor.  If you experience the worst abdominal pain ever or develop nausea or vomiting, please contact the office immediately for further recommendations for treatment.   ITCHING:  If you experience itching with your medications, try taking only a single pain pill, or even half a pain pill at a time.  You can also use Benadryl  over the counter for itching or also to help with sleep.   TED HOSE STOCKINGS:  Use stockings on both legs until for at least 2 weeks or as directed by physician office. They may be removed at night for sleeping.  MEDICATIONS:  See your medication summary on the "After Visit Summary" that nursing will review with you.  You may have some home medications which will be placed on hold until you complete the course of blood thinner medication.  It is important for you to complete the blood thinner medication as prescribed.  PRECAUTIONS:  If you experience chest pain or shortness of breath - call 911 immediately for transfer to the hospital emergency department.   If you develop a fever greater that 101 F, purulent drainage from wound, increased  redness or drainage from wound, foul odor from the wound/dressing, or calf pain - CONTACT YOUR SURGEON.                                                   FOLLOW-UP APPOINTMENTS:  If you do not already have a post-op appointment, please call the office for an appointment to be seen by your surgeon.  Guidelines for how soon to be seen are listed in your "After Visit Summary", but are typically between 1-4 weeks after surgery.  OTHER INSTRUCTIONS:   Knee Replacement:  Do not place pillow under knee, focus on keeping the knee straight while resting. CPM instructions: 0-90 degrees, 2 hours in the morning, 2 hours in the afternoon, and 2 hours in the evening. Place foam block, curve side up under heel at all times except when in CPM or when walking.  DO NOT modify, tear, cut, or change the foam block in any way.  POST-OPERATIVE OPIOID TAPER INSTRUCTIONS: It is important to wean off of your opioid medication as soon as possible. If you do not need pain medication after your surgery it is ok to stop day one. Opioids include: Codeine, Hydrocodone(Norco, Vicodin), Oxycodone (Percocet, oxycontin ) and hydromorphone  amongst others.  Long term and even short term use of opiods can cause: Increased pain response Dependence Constipation Depression Respiratory depression And more.  Withdrawal symptoms can include Flu like symptoms Nausea, vomiting And more Techniques to manage these symptoms Hydrate well Eat regular healthy meals Stay active Use relaxation techniques(deep breathing, meditating, yoga) Do Not substitute Alcohol to help with tapering If you have been on opioids for less than two weeks and do not have pain than it is ok to stop all together.  Plan to wean off of opioids This plan should start within one week post op of your joint replacement. Maintain the same interval or time between taking each dose and first decrease the dose.  Cut the total daily intake of opioids by one tablet each  day Next start to increase the time between doses. The last dose that should be eliminated  is the evening dose.   MAKE SURE YOU:  Understand these instructions.  Get help right away if you are not doing well or get worse.    Thank you for letting us  be a part of your medical care team.  It is a privilege we respect greatly.  We hope these instructions will help you stay on track for a fast and full recovery!

## 2023-12-25 NOTE — Brief Op Note (Signed)
 12/25/2023  9:12 AM  PATIENT:  Dawn Haley  68 y.o. female  PRE-OPERATIVE DIAGNOSIS:  Primary osteoarthritis of right knee  POST-OPERATIVE DIAGNOSIS:  Primary osteoarthritis of right knee  PROCEDURE:  Procedure(s): ARTHROPLASTY, KNEE, TOTAL (Right) DePuy Attune  SURGEON:  Surgeons and Role:    DEWAINE Kay Kemps, MD - Primary  PHYSICIAN ASSISTANT:   ASSISTANTS: Debby KATHEE Fireman, PA-C   ANESTHESIA:   regional and spinal  EBL:  minimal  BLOOD ADMINISTERED:none  DRAINS: none   LOCAL MEDICATIONS USED:  MARCAINE      SPECIMEN:  No Specimen  DISPOSITION OF SPECIMEN:  N/A  COUNTS:  YES  TOURNIQUET:   Total Tourniquet Time Documented: Thigh (Right) - 74 minutes Total: Thigh (Right) - 74 minutes   DICTATION: .Other Dictation: Dictation Number [id number]  PLAN OF CARE: Discharge to home after PACU  PATIENT DISPOSITION:  PACU - hemodynamically stable.   Delay start of Pharmacological VTE agent (>24hrs) due to surgical blood loss or risk of bleeding: no

## 2023-12-25 NOTE — Progress Notes (Signed)
 PHARMACIST - PHYSICIAN ORDER COMMUNICATION  CONCERNING: P&T Medication Policy on Herbal Medications  DESCRIPTION:  This patient's order for:  Ubiquinol and Glucosamine  have been noted.  This product(s) is classified as an "herbal" or natural product. Due to a lack of definitive safety studies or FDA approval, nonstandard manufacturing practices, plus the potential risk of unknown drug-drug interactions while on inpatient medications, the Pharmacy and Therapeutics Committee does not permit the use of "herbal" or natural products of this type within [facility].   ACTION TAKEN: The pharmacy department is unable to verify this order at this time and your patient has been informed of this safety policy. Please reevaluate patient's clinical condition at discharge and address if the herbal or natural product(s) should be resumed at that time.  Iantha Batch, PharmD, BCPS 12/25/2023 3:09 PM

## 2023-12-25 NOTE — Anesthesia Procedure Notes (Addendum)
 Anesthesia Regional Block: Adductor canal block   Pre-Anesthetic Checklist: , timeout performed,  Correct Patient, Correct Site, Correct Laterality,  Correct Procedure, Correct Position, site marked,  Risks and benefits discussed,  Surgical consent,  Pre-op evaluation,  At surgeon's request and post-op pain management  Laterality: Lower and Right  Prep: chloraprep       Needles:  Injection technique: Single-shot  Needle Type: Echogenic Needle     Needle Length: 9cm  Needle Gauge: 21     Additional Needles:   Procedures:,,,, ultrasound used (permanent image in chart),,    Narrative:  Start time: 12/25/2023 7:05 AM End time: 12/25/2023 7:08 AM Injection made incrementally with aspirations every 5 mL.  Performed by: Personally  Anesthesiologist: Boone Fess, MD  Additional Notes: Patient's chart reviewed and they were deemed appropriate candidate for procedure, per surgeon's request. Patient educated about risks, benefits, and alternatives of the block including but not limited to: temporary or permanent nerve damage, bleeding, infection, damage to surround tissues, block failure, local anesthetic toxicity. Patient expressed understanding. A formal time-out was conducted consistent with institution rules.  Monitors were applied, and minimal sedation used (see nursing record). The site was prepped with skin prep and allowed to dry, and sterile gloves were used. A high frequency linear ultrasound probe with probe cover was utilized throughout. Femoral artery visualized at mid-thigh level, local anesthetic injected anterolateral to it, and echogenic block needle trajectory was monitored throughout. Hydrodissection of saphenous nerve visualized and appeared anatomically normal. Aspiration performed every 5ml. Blood vessels were avoided. All injections were performed without resistance and free of blood and paresthesias. The patient tolerated the procedure well.  Injectate: 20ml 0.25%  bupivacaine  with epi

## 2023-12-25 NOTE — Interval H&P Note (Signed)
 History and Physical Interval Note:  12/25/2023 7:10 AM  Dawn Haley  has presented today for surgery, with the diagnosis of Primary osteoarthritis of right knee.  The various methods of treatment have been discussed with the patient and family. After consideration of risks, benefits and other options for treatment, the patient has consented to  Procedure(s): ARTHROPLASTY, KNEE, TOTAL (Right) as a surgical intervention.  The patient's history has been reviewed, patient examined, no change in status, stable for surgery.  I have reviewed the patient's chart and labs.  Questions were answered to the patient's satisfaction.     [name]

## 2023-12-25 NOTE — Progress Notes (Signed)
 Physical Therapy Treatment Patient Details Name: Dawn Haley MRN: [id number] DOB: [birth year] Today's Date: 12/25/2023   History of Present Illness 68 yo female presents to therapy s/p R TKA on 12/25/2023 due to failure of conservative measures. Pt PMH includes but is not limited to: diverticulitis,  R miniscal tear (06/2023), DOE, HA and abdominal surgeries.    PT Comments   Dawn Haley is a 68 y.o. female POD 0 s/p R TKA. Patient reports IND with mobility at baseline. Patient is now limited by functional impairments (see PT problem list below) and requires CGA for supine to sit and min A for sit to supine for bed mobility and is unable to complete transfers nor gait tasks at time of intervention due to escalated pain response and reports of feeling hot and light headed.  Patient will benefit from continued skilled PT interventions to address impairments and progress towards PLOF. Acute PT will follow to progress mobility and stair training in preparation for safe discharge home. PT returned to re-assess pt for safe transition home with same day d/c 1.5 hrs later.  Bp at rest semi reclined prior to PT return 112/63(67 PR) Bp seated EOB 102/54 (68 PR) Bp in supine 114/64 (80 PR) Bp returning to semi reclined position in stretcher 115/63 (73 PR)   If plan is discharge home, recommend the following: A lot of help with walking and/or transfers;A lot of help with bathing/dressing/bathroom;Assist for transportation;Help with stairs or ramp for entrance;Assistance with cooking/housework   Can travel by private vehicle        Equipment Recommendations  None recommended by PT    Recommendations for Other Services       Precautions / Restrictions Precautions Precautions: Knee;Fall Restrictions Weight Bearing Restrictions Per Provider Order: No     Mobility  Bed Mobility Overal bed mobility: Needs Assistance Bed Mobility: Supine to Sit, Sit to Supine     Supine to sit: Contact guard,  HOB elevated, Used rails Sit to supine: Min assist   General bed mobility comments: min cues    Transfers                   General transfer comment: NT due to pain    Ambulation/Gait               General Gait Details: NT   Stairs             Wheelchair Mobility     Tilt Bed    Modified Rankin (Stroke Patients Only)       Balance                                            Communication Communication Communication: No apparent difficulties  Cognition Arousal: Alert Behavior During Therapy: WFL for tasks assessed/performed   PT - Cognitive impairments: No apparent impairments                         Following commands: Intact      Cueing    Exercises Total Joint Exercises Ankle Circles/Pumps: AROM, Both, 10 reps Quad Sets: AROM, Right, 5 reps Short Arc Quad: AROM, Right, 5 reps (pt reports pain increased from 7/10 to 9/10 when completing SAQ) Heel Slides: AROM, Right, 5 reps Hip ABduction/ADduction: AROM, Right, 5 reps Straight Leg Raises: AROM, Right, 5 reps  General Comments        Pertinent Vitals/Pain Pain Assessment Pain Assessment: 0-10 Pain Score: 9  Pain Location: R knee and LE Pain Descriptors / Indicators: Aching, Constant, Burning, Operative site guarding, Dull, Grimacing Pain Intervention(s): Limited activity within patient's tolerance, Monitored during session, Repositioned, Premedicated before session, Ice applied    Home Living Family/patient expects to be discharged to:: Private residence Living Arrangements: Spouse/significant other Available Help at Discharge: Family Type of Home: House Home Access: Stairs to enter Entrance Stairs-Rails: None Entrance Stairs-Number of Steps: 1   Home Layout: One level Home Equipment: Agricultural consultant (2 wheels);Shower seat;Cane - single point;Crutches      Prior Function            PT Goals (current goals can now be found in the care  plan section) Acute Rehab PT Goals Patient Stated Goal: back to doing everything without thinking about it PT Goal Formulation: With patient Time For Goal Achievement: 01/08/24 Potential to Achieve Goals: Good Progress towards PT goals: Progressing toward goals (limited due to pain and reports of light headedness)    Frequency    7X/week      PT Plan      Co-evaluation              AM-PAC PT 6 Clicks Mobility   Outcome Measure                   End of Session Equipment Utilized During Treatment: Gait belt Activity Tolerance: Patient limited by pain;Treatment limited secondary to medical complications (Comment) (hypotension) Patient left: in bed;with call bell/phone within reach;with nursing/sitter in room;with family/visitor present Nurse Communication: Other (comment) (Bp findings suspect medicaiton) PT Visit Diagnosis: Unsteadiness on feet (R26.81);Other abnormalities of gait and mobility (R26.89);Muscle weakness (generalized) (M62.81);Difficulty in walking, not elsewhere classified (R26.2);Pain Pain - Right/Left: Right Pain - part of body: Knee;Leg     Time: 8689-8671 PT Time Calculation (min) (ACUTE ONLY): 18 min  Charges:    $Therapeutic Exercise: 8-22 mins $Therapeutic Activity: 8-22 mins PT General Charges $$ ACUTE PT VISIT: 1 Visit                     [place], PT Acute Rehab    [place] VEAR Drone 12/25/2023, 1:40 PM

## 2023-12-25 NOTE — Op Note (Signed)
 NAME: Dawn Haley, Dawn Haley MEDICAL RECORD NO: [id number] ACCOUNT NO: [id number] DATE OF BIRTH: 1955-07-20 FACILITY: [facility] LOCATION: [facility] PHYSICIAN: Elspeth SAUNDERS. Kay, MD  Operative Report   DATE OF PROCEDURE: 12/25/2023  PREOPERATIVE DIAGNOSIS:  Right knee end-stage arthritis.  POSTOPERATIVE DIAGNOSIS:  Right knee end-stage arthritis.  PROCEDURE PERFORMED:  Right total knee arthroplasty using DePuy Attune prosthesis.  ATTENDING SURGEON:  Elspeth SAUNDERS. Kay, MD  ASSISTANT:  Debby Crock [place], [place], who was scrubbed during the entire procedure, and necessary for satisfactory completion of surgery.  ANESTHESIA:  Spinal anesthesia was used plus adductor canal block.  ESTIMATED BLOOD LOSS:  Minimal.  FLUID REPLACEMENT:  1000 mL crystalloid.  INSTRUMENT COUNTS:  Correct.  COMPLICATIONS: No complications.  ANTIBIOTICS:  Perioperative antibiotics were given.  TOURNIQUET TIME:  1 hour and 14 minutes at 275 mmHg.  INDICATIONS:  The patient is a 67 year old female with worsening knee pain secondary to end-stage arthritis.  The patient has failed conservative management and desires operative treatment to eliminate pain and restore function.  Informed consent  obtained.  DESCRIPTION OF PROCEDURE:  After an adequate level of spinal anesthesia and the adductor canal block was placed, the patient was positioned supine on the operating room table.  A nonsterile tourniquet was placed on the proximal thigh.  Sterile prep and  drape of the right knee performed.  Timeout called verifying correct patient, correct site.  We elevated the leg and exsanguinated with an Esmarch bandage, inflated the tourniquet to 275 mmHg.  We then placed the knee in flexion and performed a  longitudinal midline incision with a 10 blade scalpel.  We used a fresh 10 blade scalpel for the medial parapatellar arthrotomy.  We divided the lateral patellofemoral ligaments, everted the patella, exposing the distal femur, which was  devoid of  cartilage.  We entered the distal femur with a step-cut drill.  We then placed our intramedullary guide, set on 10 degrees with a 5-degree valgus setting, so we resected 10 mm off the distal femur as the patient had a slight flexion contracture.  We then  sized the femur to size 7 anterior down, performing anterior, posterior, and chamfer for cuts with a 4-in-1 block.  We had a small notch anteriorly, which was less than 1 mm.  At this point, after completing our femoral preparation, we went ahead and  removed the ACL and PCL meniscal tissue, subluxing the tibia anteriorly.  We then used the external cutting jig to cut the tibia 90 degrees perpendicular to the long axis of the tibia with minimal posterior slope, resecting 2 mm off the affected medial  side.  Once we had our tibia cut done, we used a lamina spreader and removed posterior femoral condyle osteophytes with a curved osteotome.  We injected the posterior capsule with a combination of Marcaine , Exparel  and saline.  Next, we checked our gaps,  which were symmetric at 5 mm.  We then completed our tibial preparation for the 7 tibia setting the midpoint of the tibial component even with the medial one-third of the tubercle.  Once we did the modular drill and keel punch for the tibia and that was  in place, we went to the femur and cut the box for the 7 right femur.  We drilled the lug holes once we put the trial femur in place.  We reduced the knee with a size 5 poly.  We were happy with that soft tissue balancing.  We then resurfaced the  patella going from  22 mm thickness down to 14 mm thickness and drilling lug holes for the 38 patellar button.  We ranged the knee and had excellent patellar tracking with no touch technique.  We irrigated thoroughly.  We then removed all the trial  components, pulse irrigated the bone and dried really well and vacuum mixed high viscosity cement on the back table.  We then cemented the components into place  along one step with the tibia, femur, and the patella, placed the knee in extension with a  size 7, 5 mm poly, and held that with good compression while the cement set up.  We also had a patellar clamp holding the patella compressed.  Once all cement was hardened on the back table, we removed excess cement with a quarter-inch curved osteotome  inspecting the entire knee.  We also injected the anterior capsule with a combination of Marcaine , Exparel  and saline.  We selected the real size 7 5-mm poly, removed the trial, placed the real in place, and had a nice little pop as that medial condyle  reduced.  Excellent patellar tracking and good stability throughout a full arc of motion.  We irrigated thoroughly and repaired the parapatellar arthrotomy with #1 Vicryl suture interrupted followed by 0 and 2-0 layered subcutaneous closure and 4-0  running Monocryl skin.  Steri-Strips applied followed by a sterile dressing.  The patient tolerated surgery well.   PUS D: 12/25/2023 9:17:32 am T: 12/25/2023 9:46:00 am  JOB: [id number]/ [id number]

## 2023-12-25 NOTE — Progress Notes (Signed)
 Orthopedic Tech Progress Note Patient Details:  Dawn Haley [birth year] [id number] Bone foam was delivered to bedside. Patient is being transferred to a hospital bed at the moment. RN will apply bone foam after transfer. Ortho Devices Type of Ortho Device: Bone foam zero knee Ortho Device/Splint Location: Right knee Ortho Device/Splint Interventions: Ordered      Dawn Haley [name] 12/25/2023, 1:39 PM

## 2023-12-26 DIAGNOSIS — Z7982 Long term (current) use of aspirin: Secondary | ICD-10-CM | POA: Diagnosis not present

## 2023-12-26 DIAGNOSIS — M1711 Unilateral primary osteoarthritis, right knee: Secondary | ICD-10-CM | POA: Diagnosis not present

## 2023-12-26 DIAGNOSIS — F1090 Alcohol use, unspecified, uncomplicated: Secondary | ICD-10-CM | POA: Diagnosis not present

## 2023-12-26 DIAGNOSIS — I10 Essential (primary) hypertension: Secondary | ICD-10-CM | POA: Diagnosis not present

## 2023-12-26 MED ORDER — ACETAMINOPHEN 500 MG PO TABS
1000.0000 mg | ORAL_TABLET | Freq: Three times a day (TID) | ORAL | Status: DC
  Administered 2023-12-26 – 2023-12-27 (×3): 1000 mg via ORAL
  Filled 2023-12-26 (×3): qty 2

## 2023-12-26 NOTE — Progress Notes (Signed)
 Physical Therapy Treatment Patient Details Name: Dawn Haley MRN: [id number] DOB: Sep 13, 1955 Today's Date: 12/26/2023   History of Present Illness 68 yo female presents to therapy s/p R TKA on 12/25/2023 due to failure of conservative measures. Pt PMH includes but is not limited to: diverticulitis,  R miniscal tear (2/205), DOE, HA and abdominal surgeries.    PT Comments  Pt progressing, able to amb ~ 25' however limited by  pain, dizziness. BP 134/60 after seated rest/reclined. Pt is mobilizing well enough to d/c however may have to hold d/t medical issues pending progress this pm    If plan is discharge home, recommend the following: A little help with walking and/or transfers;A little help with bathing/dressing/bathroom;Help with stairs or ramp for entrance   Can travel by private vehicle        Equipment Recommendations  None recommended by PT    Recommendations for Other Services       Precautions / Restrictions Precautions Precautions: Knee;Fall Recall of Precautions/Restrictions: Intact Restrictions Weight Bearing Restrictions Per Provider Order: No Other Position/Activity Restrictions: WBAT     Mobility  Bed Mobility Overal bed mobility: Needs Assistance             General bed mobility comments: sitting on EOB on PT arrival    Transfers Overall transfer level: Needs assistance Equipment used: Rolling walker (2 wheels) Transfers: Sit to/from Stand Sit to Stand: Contact guard assist           General transfer comment: cues for hand placement and safety    Ambulation/Gait Ambulation/Gait assistance: Contact guard assist, Supervision Gait Distance (Feet): 25 Feet Assistive device: Rolling walker (2 wheels) Gait Pattern/deviations: Step-to pattern       General Gait Details: cues for sequence, limited by pain, dizziness   Stairs             Wheelchair Mobility     Tilt Bed    Modified Rankin (Stroke Patients Only)        Balance                                            Communication Communication Communication: No apparent difficulties  Cognition Arousal: Alert Behavior During Therapy: WFL for tasks assessed/performed   PT - Cognitive impairments: No apparent impairments                         Following commands: Intact      Cueing Cueing Techniques: Verbal cues  Exercises Total Joint Exercises Ankle Circles/Pumps: AROM, Both, 10 reps Quad Sets: AROM, Right, 5 reps Heel Slides: AROM, Right, 5 reps, AAROM    General Comments        Pertinent Vitals/Pain Pain Assessment Pain Assessment: Faces Faces Pain Scale: Hurts whole lot Pain Location: R knee and LE Pain Descriptors / Indicators: Aching, Constant, Burning, Operative site guarding, Dull, Grimacing Pain Intervention(s): Limited activity within patient's tolerance, Monitored during session, Premedicated before session, Repositioned    Home Living                          Prior Function            PT Goals (current goals can now be found in the care plan section) Acute Rehab PT Goals Patient Stated Goal: back to doing everything without thinking  about it PT Goal Formulation: With patient Time For Goal Achievement: 01/08/24 Potential to Achieve Goals: Good Progress towards PT goals: Progressing toward goals    Frequency    7X/week      PT Plan      Co-evaluation              AM-PAC PT 6 Clicks Mobility   Outcome Measure  Help needed turning from your back to your side while in a flat bed without using bedrails?: A Little Help needed moving from lying on your back to sitting on the side of a flat bed without using bedrails?: A Little Help needed moving to and from a bed to a chair (including a wheelchair)?: A Little Help needed standing up from a chair using your arms (e.g., wheelchair or bedside chair)?: A Little Help needed to walk in hospital room?: A Little Help  needed climbing 3-5 steps with a railing? : A Little 6 Click Score: 18    End of Session Equipment Utilized During Treatment: Gait belt Activity Tolerance: Patient limited by pain;Other (comment) (dizziness/diaphoresis) Patient left: in chair;with call bell/phone within reach;with family/visitor present   PT Visit Diagnosis: Unsteadiness on feet (R26.81);Other abnormalities of gait and mobility (R26.89);Muscle weakness (generalized) (M62.81);Difficulty in walking, not elsewhere classified (R26.2);Pain Pain - Right/Left: Right Pain - part of body: Knee;Leg     Time: 1000-1030 PT Time Calculation (min) (ACUTE ONLY): 30 min  Charges:    $Gait Training: 8-22 mins $Therapeutic Exercise: 8-22 mins PT General Charges $$ ACUTE PT VISIT: 1 Visit                     [place] Pacella, PT  Acute Rehab Dept [facility]) [phone]  12/26/2023    [facility] 12/26/2023, 11:21 AM

## 2023-12-26 NOTE — Progress Notes (Signed)
    Subjective:  Patient reports pain as mild to moderate.  Denies N/V/CP/SOB/Abd pain. She reports pain that is doing well with the pain medication. She denies tingling or numbness in LE bilaterally.  All questions solicited and answered.  Up with PT today.  She is voiding without difficulty.   Objective:   VITALS:   Vitals:   12/25/23 1800 12/25/23 2039 12/26/23 0122 12/26/23 0531  BP: (!) 108/56 128/64 126/64 120/68  Pulse: 75 (!) 58 73 73  Resp:  18 18 17   Temp:  98 F (36.7 C) 98.2 F (36.8 C) 97.6 F (36.4 C)  TempSrc:  Oral Oral Oral  SpO2:  97% 96% 97%  Weight:      Height:        NAD Neurologically intact ABD soft Neurovascular intact Sensation intact distally Intact pulses distally Dorsiflexion/Plantar flexion intact No cellulitis present Compartment soft Incision C/D/I, bulky dressing removed, Aquacel dressing placed.   Lab Results  Component Value Date   WBC 5.2 12/15/2023   HGB 14.7 12/15/2023   HCT 45.5 12/15/2023   MCV 93.2 12/15/2023   PLT 268 12/15/2023   BMET    Component Value Date/Time   NA 139 12/15/2023 0841   K 4.3 12/15/2023 0841   CL 105 12/15/2023 0841   CO2 27 12/15/2023 0841   GLUCOSE 107 (H) 12/15/2023 0841   BUN 17 12/15/2023 0841   CREATININE 0.90 12/15/2023 0841   CALCIUM  9.5 12/15/2023 0841   CALCIUM  8.9 10/28/2019 1652   GFRNONAA >60 12/15/2023 0841     Assessment/Plan: 1 Day Post-Op   Principal Problem:   Total knee replacement status   WBAT with walker DVT ppx: Aspirin , SCDs, TEDS PO pain control PT/OT: to come today.  Dispo:  - D/c home once cleared with PT.    Dawn Haley 12/26/2023, 9:54 AM   [facility]  [place] [address], [place], [place] [address] Phone: [phone] [email or web address] Facebook  [employer]

## 2023-12-26 NOTE — Progress Notes (Signed)
   12/26/23 1601  TOC Brief Assessment  Insurance and Status Reviewed  Patient has primary care physician Yes  Home environment has been reviewed home w/ spouse  Prior level of function: independent  Prior/Current Home Services No current home services  Social Drivers of Health Review SDOH reviewed no interventions necessary  Readmission risk has been reviewed Yes  Transition of care needs no transition of care needs at this time   HEP/has DME

## 2023-12-26 NOTE — Progress Notes (Signed)
 PT TX NOTE   12/26/23 1500  PT Visit Information  Last PT Received On 12/26/23  Assistance Needed Pt  reports incr pain despite meds however willing to work within limits. Able to reviewed TKA exercises, amb incr distance--no dizziness however c/o feeling hot/cold and shaky.  Pt is requesting additional or more frequent pain med dosing -RN aware and checking with MD  History of Present Illness 68 yo female presents to therapy s/p R TKA on 12/25/2023 due to failure of conservative measures. Pt PMH includes but is not limited to: diverticulitis,  R miniscal tear (2/205), DOE, HA and abdominal surgeries.  Subjective Data  Patient Stated Goal back to doing everything without thinking about it  Precautions  Precautions Knee;Fall  Recall of Precautions/Restrictions Intact  Restrictions  Weight Bearing Restrictions Per Provider Order No  Other Position/Activity Restrictions WBAT  Pain Assessment  Pain Assessment Faces  Faces Pain Scale 8  Pain Location R knee and LE  Pain Descriptors / Indicators Aching;Constant;Burning;Operative site guarding;Dull;Grimacing  Pain Intervention(s) Limited activity within patient's tolerance;Monitored during session;Premedicated before session;Repositioned;Ice applied;Patient requesting pain meds-RN notified  Cognition  Arousal Alert  Behavior During Therapy WFL for tasks assessed/performed  PT - Cognitive impairments No apparent impairments  Following Commands  Following commands Intact  Cueing  Cueing Techniques Verbal cues  Communication  Communication No apparent difficulties  Bed Mobility  Overal bed mobility Needs Assistance  Bed Mobility Supine to Sit;Sit to Supine  Supine to sit Supervision;Contact guard  Sit to supine Supervision  General bed mobility comments able to use leg lift to self assist, CGA to lower RLE off bed. bed flat no rails  Transfers  Overall transfer level Needs assistance  Equipment used Rolling walker (2 wheels)  Transfers  Sit to/from Stand  Sit to Stand Contact guard assist;Supervision  General transfer comment cues for hand placement and safety.STS from bed and [facility]  Ambulation/Gait  Ambulation/Gait assistance Contact guard assist;Supervision  Gait Distance (Feet) 60 Feet (10' more)  Assistive device Rolling walker (2 wheels)  Gait Pattern/deviations Step-to pattern  General Gait Details cues for sequence, posture, RW position. wt shift to RLE improved from am session. no dizziness however pt reports being hot, shaky this pm  Total Joint Exercises  Ankle Circles/Pumps AROM;Both;10 reps  Heel Slides AROM;Right;AAROM;10 reps  Straight Leg Raises AROM;Right;5 reps  PT - End of Session  Equipment Utilized During Treatment Gait belt  Activity Tolerance Patient limited by pain;Patient tolerated treatment well  Patient left in chair;with call bell/phone within reach;with family/visitor present   PT - Assessment/Plan  PT Visit Diagnosis Unsteadiness on feet (R26.81);Other abnormalities of gait and mobility (R26.89);Muscle weakness (generalized) (M62.81);Difficulty in walking, not elsewhere classified (R26.2);Pain  Pain - Right/Left Right  Pain - part of body Knee;Leg  PT Frequency (ACUTE ONLY) 7X/week  Follow Up Recommendations Follow physician's recommendations for discharge plan and follow up therapies  Patient can return home with the following A little help with walking and/or transfers;A little help with bathing/dressing/bathroom;Help with stairs or ramp for entrance  PT equipment None recommended by PT  AM-PAC PT 6 Clicks Mobility Outcome Measure (Version 2)  Help needed turning from your back to your side while in a flat bed without using bedrails? 3  Help needed moving from lying on your back to sitting on the side of a flat bed without using bedrails? 3  Help needed moving to and from a bed to a chair (including a wheelchair)? 3  Help needed standing up from  a chair using your arms (e.g., wheelchair  or bedside chair)? 3  Help needed to walk in hospital room? 3  Help needed climbing 3-5 steps with a railing?  3  6 Click Score 18  Consider Recommendation of Discharge To: Home with [facility]  Progressive Mobility  What is the highest level of mobility based on the mobility assessment? Level 4 (Ambulates with assistance) - Balance while stepping forward/back - Complete  PT Goal Progression  Progress towards PT goals Progressing toward goals  Acute Rehab PT Goals  PT Goal Formulation With patient  Time For Goal Achievement 01/08/24  Potential to Achieve Goals Good  PT Time Calculation  PT Start Time (ACUTE ONLY) 1454  PT Stop Time (ACUTE ONLY) 1533  PT Time Calculation (min) (ACUTE ONLY) 39 min  PT General Charges  $$ ACUTE PT VISIT 1 Visit  PT Treatments  $Gait Training 23-37 mins  $Therapeutic Exercise 8-22 mins

## 2023-12-26 NOTE — Care Management Obs Status (Signed)
 MEDICARE OBSERVATION STATUS NOTIFICATION   Patient Details  Name: MARJORIE DEPREY MRN: [id number] Date of Birth: December 02, 1955   Medicare Observation Status Notification Given:  Yes    Sheri ONEIDA Sharps, LCSW 12/26/2023, 3:59 PM

## 2023-12-26 NOTE — Plan of Care (Signed)
  Problem: Health Behavior/Discharge Planning: Goal: Ability to manage health-related needs will improve 12/26/2023 1449 by Sebastian Boyer, RN Outcome: Progressing 12/26/2023 1449 by Sebastian Boyer, RN Outcome: Progressing

## 2023-12-26 NOTE — Plan of Care (Signed)

## 2023-12-27 DIAGNOSIS — M1711 Unilateral primary osteoarthritis, right knee: Secondary | ICD-10-CM | POA: Diagnosis not present

## 2023-12-27 DIAGNOSIS — Z7982 Long term (current) use of aspirin: Secondary | ICD-10-CM | POA: Diagnosis not present

## 2023-12-27 DIAGNOSIS — I10 Essential (primary) hypertension: Secondary | ICD-10-CM | POA: Diagnosis not present

## 2023-12-27 DIAGNOSIS — F1090 Alcohol use, unspecified, uncomplicated: Secondary | ICD-10-CM | POA: Diagnosis not present

## 2023-12-27 NOTE — Progress Notes (Signed)
   Subjective: 2 Days Post-Op Procedure(s) (LRB): ARTHROPLASTY, KNEE, TOTAL (Right) Patient reports pain as mild.   Patient seen in rounds for Dr. Kay . Patient is well, and has had no acute complaints or problems. No acute events overnight. Patient ambulated well with PT today.  We will start therapy today.   Objective: Vital signs in last 24 hours: Temp:  [98 F (36.7 C)-98.6 F (37 C)] 98.4 F (36.9 C) (08/17 0529) Pulse Rate:  [72-75] 75 (08/17 0529) Resp:  [15-18] 15 (08/17 0529) BP: (120-145)/(58-71) 120/61 (08/17 0529) SpO2:  [90 %-100 %] 98 % (08/17 0529)  Intake/Output from previous day:  Intake/Output Summary (Last 24 hours) at 12/27/2023 1144 Last data filed at 12/27/2023 1001 Gross per 24 hour  Intake 720 ml  Output 400 ml  Net 320 ml     Intake/Output this shift: Total I/O In: 240 [P.O.:240] Out: -   Labs: No results for input(s): HGB in the last 72 hours. No results for input(s): WBC, RBC, HCT, PLT in the last 72 hours. No results for input(s): NA, K, CL, CO2, BUN, CREATININE, GLUCOSE, CALCIUM  in the last 72 hours. No results for input(s): LABPT, INR in the last 72 hours.  Exam: General - Patient is Alert and Oriented Extremity - Neurologically intact Sensation intact distally Intact pulses distally Dorsiflexion/Plantar flexion intact Dressing - dressing C/D/I Motor Function - intact, moving foot and toes well on exam.   Past Medical History:  Diagnosis Date   Anxiety    Arthritis    Diverticulitis    Dyspnea    walking up hill   Dysrhythmia    PAF   Hypertension    Meniere disease of right ear    Pneumonia    Pre-diabetes     Assessment/Plan: 2 Days Post-Op Procedure(s) (LRB): ARTHROPLASTY, KNEE, TOTAL (Right) Principal Problem:   Total knee replacement status  Estimated body mass index is 31.28 kg/m as calculated from the following:   Height as of this encounter: 5' 10 (1.778 m).   Weight as of  this encounter: 98.9 kg. Advance diet Up with therapy D/C IV fluids   Patient's anticipated LOS is less than 2 midnights, meeting these requirements: - Younger than 48 - Lives within 1 hour of care - Has a competent adult at home to recover with post-op recover - NO history of  - Chronic pain requiring opiods  - Diabetes  - Coronary Artery Disease  - Heart failure  - Heart attack  - Stroke  - DVT/VTE  - Cardiac arrhythmia  - Respiratory Failure/COPD  - Renal failure  - Anemia  - Advanced Liver disease     DVT Prophylaxis - Aspirin  Weight bearing as tolerated.   Plan is to go Home after hospital stay. Plan for discharge today following 1-2 sessions of PT as long as they are meeting their goals. Patient is scheduled for OPPT. Follow up in the office in 2 weeks.   Rosina Calin, PA-C Orthopedic Surgery [phone] 12/27/2023, 11:44 AM

## 2023-12-27 NOTE — Progress Notes (Signed)
 Reviewed written d/c instructions w pt and all questions answered. She verbalized understanding. D/C via w/c w all belongings in stable condition.

## 2023-12-27 NOTE — Therapy (Signed)
 OUTPATIENT PHYSICAL THERAPY LOWER EXTREMITY EVALUATION   Patient Name: Dawn Haley MRN: [id number] DOB:02/13/1956, 68 y.o., female Today's Date: 12/29/2023  END OF SESSION:  PT End of Session - 12/28/23 1115     Visit Number 1    Number of Visits 22    Date for PT Re-Evaluation 03/07/24    Authorization Type BCBS MCR    PT Start Time 1106    PT Stop Time 1225    PT Time Calculation (min) 79 min    Activity Tolerance Patient tolerated treatment well    Behavior During Therapy WFL for tasks assessed/performed          Past Medical History:  Diagnosis Date   Anxiety    Arthritis    Diverticulitis    Dyspnea    walking up hill   Dysrhythmia    PAF   Hypertension    Meniere disease of right ear    Pneumonia    Pre-diabetes    Past Surgical History:  Procedure Laterality Date   ABDOMINAL HYSTERECTOMY     APPENDECTOMY     COLON SURGERY  2022   Colectomy for colovaginal fistula & diverticulitis   CYST EXCISION Right 11/02/2015   Procedure: RIGHT INDEX EXCISION MASS ;  Surgeon: Franky Curia, MD;  Location: [facility];  Service: Orthopedics;  Laterality: Right;   CYSTOSCOPY Bilateral 11/09/2019   Procedure: CYSTOSCOPY WITH BILATERAL FIREFLY  INJECTION;  Surgeon: Devere Lonni Righter, MD;  Location: [facility];  Service: Urology;  Laterality: Bilateral;   DIAGNOSTIC LAPAROSCOPY     GYN   DILATION AND CURETTAGE OF UTERUS     TONSILLECTOMY     TOTAL KNEE ARTHROPLASTY Right 12/25/2023   Procedure: ARTHROPLASTY, KNEE, TOTAL;  Surgeon: Kay Kemps, MD;  Location: [facility];  Service: Orthopedics;  Laterality: Right;   URETHRAL DILATION     WISDOM TOOTH EXTRACTION     Patient Active Problem List   Diagnosis Date Noted   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   Paroxysmal atrial fibrillation (HCC) 02/17/2023   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   Hypocalcemia 10/27/2019   Sigmoid diverticulitis 10/26/2019   Chest pain  06/06/2015   GERD (gastroesophageal reflux disease) 06/06/2015   Costochondritis 06/06/2015     REFERRING PROVIDER: Kay Kemps, MD   REFERRING DIAG: M17.11 (ICD-10-CM) - Unilateral primary osteoarthritis, right knee  S/p R TKA   THERAPY DIAG:  Right knee pain, unspecified chronicity  Stiffness of right knee, not elsewhere classified  Muscle weakness (generalized)  Difficulty in walking, not elsewhere classified  Rationale for Evaluation and Treatment: Rehabilitation  ONSET DATE: DOS  12/25/23  SUBJECTIVE:   SUBJECTIVE STATEMENT: Pt reports having pain in bilat knees beginning last summer.  Her meniscus tore in February when stepping backwards on a bar that was on the floor.  She was on crutches for months.  Pt underwent R TKA on 12/25/23 and stayed in the hospital until yesterday.  Pt states she had a reaction to oxycodone .  Pt received PT while in the hospital.   She came home yesterday.    Pt is limited with her normal functional mobility skills including ambulation, transfers, and bed mobility.  Pt is unable to drive.  She is unable to perform the household chores including cooking.  Pt has the bone foam pad at home.  Pt is using the Ice[employer] at home.    PERTINENT HISTORY: R TKA  12/25/23 Arthritis including L knee, A-fib, anxiety, HTN  Pt reports having a fx in R hip 20 years ago.  PAIN:  NPRS:  6.5/10 current, 9.5/10 worst, 3/10 best Location:  R knee  PRECAUTIONS: Other: per surgical protocol for TKA    WEIGHT BEARING RESTRICTIONS: none indicated  FALLS:  Has patient fallen in last 6 months? No  LIVING ENVIRONMENT: Lives with: lives with their spouse Lives in: 1 story home Stairs: small step to enter/exit home without rail Has following equipment at home: Single point cane, Walker - 2 wheeled, and Crutches, bedside toilet, shower stool  OCCUPATION: Pt is retired.   PLOF: Independent.  Pt ambulating without an AD.   PATIENT GOALS:  Pt wants to be able to  do everything.  Be able to garden.  She wants to be able to get up and down with ease and play with her grandson.     NEXT MD VISIT: 01/07/2024  OBJECTIVE:  Note: Objective measures were completed at Evaluation unless otherwise noted.  DIAGNOSTIC FINDINGS: Pt is post op.  PATIENT SURVEYS:  LEFS:  6/80  COGNITION: Overall cognitive status: Within functional limits for tasks assessed     SENSATION: WFL in bilat knees and distal LE's  OBSERVATION:  Pt has an ACE bandage on R LE.  PT removed the ACE bandage and applied both compression stockings to bilat LE's.  Pt has an aquacell bandage over incision with a couple of small spots of drainage, nothing abnormal.      PALPATION: Slightly tender to palpate medial and lateral R knee  LOWER EXTREMITY ROM:   ROM Right eval Left eval  Hip flexion    Hip extension    Hip abduction    Hip adduction    Hip internal rotation    Hip external rotation    Knee flexion 60 deg PROM   Knee extension 5/4  AROM/PROM   Ankle dorsiflexion    Ankle plantarflexion    Ankle inversion    Ankle eversion     (Blank rows = not tested)  LOWER EXTREMITY MMT:  Strength not tested   GAIT: Comments: slow gait, step to pattern with FWW.  Decreased Wb'ing through R LE and increased Wb'ing through UE's.                                                                                                                                TREATMENT:   Pt performed supine quad sets with 5 sec hold x 10 reps, glute sets with 5 sec hold x 10 reps,  AP's x 20 reps, supine knee extension stretch with heel propped on half roll approx 2 mins, and knee extension stretch with heel propped on stool.  Educated pt with sitting in a chair with knee comfortably bent for 3-5 mins at a time at various times of the day. Pt received a HEP handout and was educated in correct form and appropriate frequency.    PATIENT EDUCATION:  Education details:  Instructed pt to not put a  pillow under knee.  Educated pt in post op protocol restrictions and expectations.  HEP, objective findings, dx, prognosis, and POC.  PT answered pt's questions.  Person educated: Patient Education method: Explanation, Demonstration, Tactile cues, Verbal cues, and Handouts Education comprehension: verbalized understanding, returned demonstration, verbal cues required, and tactile cues required  HOME EXERCISE PROGRAM: Exercises - Supine Quadricep Sets  - 2 x daily - 7 x weekly - 2 sets - 10 reps - 5 seconds hold - ANKLE PUMPS  - 7 x weekly - Seated Passive Knee Extension  - 2-3 x daily - 7 x weekly - 2 reps - 2 minutes hold -Sitting with knee flexed for 3-5 mins comfortably at various times for 6-7 times/day  Access Code: [email or web address] URL: [email or web address] Date: 12/28/2023 Prepared by: Mose Minerva  Exercises - Supine Gluteal Sets  - 2 x daily - 7 x weekly - 2 sets - 10 reps - 5 seconds hold  ASSESSMENT:  CLINICAL IMPRESSION: Patient is a 69 y.o. female 3 days s/p R TKA presenting to the clinic with expected post op findings of R knee pain, limited ROM in R knee, muscle weakness in R LE, and difficulty in walking.  Pt is limited with her normal functional mobility skills including ambulation, transfers, and bed mobility.  Pt is ambulating with a FWW.  She is unable to perform the household chores including cooking.  Pt should benefit from skilled PT per protocol to address impairments and improve overall function.      OBJECTIVE IMPAIRMENTS: Abnormal gait, decreased activity tolerance, decreased endurance, decreased mobility, difficulty walking, decreased ROM, decreased strength, hypomobility, increased edema, impaired flexibility, and pain.   ACTIVITY LIMITATIONS: bending, standing, squatting, stairs, transfers, bed mobility, dressing, and locomotion level  PARTICIPATION LIMITATIONS: meal prep, cleaning, laundry, driving, shopping, and community activity  PERSONAL  FACTORS: 1-2 comorbidities: L knee arthritis, A-fib are also affecting patient's functional outcome.   REHAB POTENTIAL: Good  CLINICAL DECISION MAKING: Stable/uncomplicated  EVALUATION COMPLEXITY: Low   GOALS:   SHORT TERM GOALS:  Pt will be independent and compliant with HEP for improved pain, ROM, strength, and function.  Baseline: Goal status: INITIAL Target date:  01/18/24   2.  Pt will demo a good quad set and be able to perform a supine SLR independently for improved strength and stability with gait. Baseline:  Goal status: INITIAL Target date:  01/11/24  3.  Pt will demo improved R knee AROM to 0 - 95 deg for improved mobility, stiffness, and gait.   Baseline:  Goal status: INITIAL Target date:  01/25/24  4.  Pt will demo improved quality of gait and ambulate with a SPC with good stability.  Baseline:  Goal status: INITIAL Target date:  01/25/24  5.  Pt will able to perform her normal daily transfers with no > than minimal difficulty.   Baseline:  Goal status: INITIAL Target date: 02/22/2024    LONG TERM GOALS: Target date: 03/07/24  Pt will demo improved R knee AROM to 0 - 115 deg for improved mobility and stiffness and for improved performance of stairs.  Baseline:  Goal status: INITIAL  2.  Pt will ambulate with a normalized heel to toe gait without limping.  Baseline:  Goal status: INITIAL  3.  Pt will ambulate community distance without an AD without significant pain and difficulty.  Baseline:  Goal status: INITIAL  4.  Pt will be able to perform household chores without significant pain  and difficulty.   Baseline:  Goal status: INITIAL  5.  Pt will be able to perform 4-5 steps with a reciprocal gait with the rail for improved performance of stairs.    Baseline:  Goal status:  INITIAL  6.  Pt will demo improved strength to 4+/5 in R hip abd, 4+ to 5/5 in Hip flexion, and 5/5 in knee ext and flexion for improved performance of daily functional  mobility and to assist with returning to recreational activities.  Baseline:  Goal status: INITIAL   PLAN:  PT FREQUENCY: 2-3x/wk x 3 weeks and 2x/wk afterwards  PT DURATION: 10 weeks  PLANNED INTERVENTIONS: 97164- PT Re-evaluation, 97750- Physical Performance Testing, 97110-Therapeutic exercises, 97530- Therapeutic activity, 97112- Neuromuscular re-education, 97535- Self Care, 02859- Manual therapy, [phone]- Gait training, [phone]- Aquatic Therapy, Patient/Family education, Balance training, Stair training, Taping, Joint mobilization, Spinal mobilization, Scar mobilization, DME instructions, Cryotherapy, and Moist heat  PLAN FOR NEXT SESSION: Cont per TKA protocol.  Review and perform HEP.  Knee ROM.   Leigh Minerva III PT, DPT 12/29/23 9:30 PM

## 2023-12-27 NOTE — Progress Notes (Signed)
 Physical Therapy Treatment Patient Details Name: Dawn Haley MRN: [id number] DOB: [birth year] Today's Date: 12/27/2023   History of Present Illness 68 yo female presents to therapy s/p R TKA on 12/25/2023 due to failure of conservative measures. Pt PMH includes but is not limited to: diverticulitis,  R miniscal tear (2/205), DOE, HA and abdominal surgeries.    PT Comments  Pt making excellent progress today. Amb incr distance, no dizziness. Pt reports feeling much better today. Spouse present for session and able to return demo assist and cue as needed. Pt and spouse feel ready to d/c today. PT in agreement. RN and PA updated.    If plan is discharge home, recommend the following: A little help with walking and/or transfers;A little help with bathing/dressing/bathroom;Help with stairs or ramp for entrance   Can travel by private vehicle        Equipment Recommendations  None recommended by PT    Recommendations for Other Services       Precautions / Restrictions Precautions Precautions: Knee;Fall Recall of Precautions/Restrictions: Intact Restrictions Weight Bearing Restrictions Per Provider Order: No Other Position/Activity Restrictions: WBAT     Mobility  Bed Mobility Overal bed mobility: Needs Assistance Bed Mobility: Supine to Sit, Sit to Supine     Supine to sit: Supervision Sit to supine: Supervision   General bed mobility comments: able to use leg lift to self assist,    Transfers Overall transfer level: Needs assistance Equipment used: Rolling walker (2 wheels) Transfers: Sit to/from Stand Sit to Stand: Supervision           General transfer comment: cues for hand placement. spouse present and instructed in stabilizing RW for safety    Ambulation/Gait Ambulation/Gait assistance: Contact guard assist, Supervision Gait Distance (Feet): 100 Feet Assistive device: Rolling walker (2 wheels) Gait Pattern/deviations: Step-to pattern       General Gait  Details: ongoing education on sequence, RW position. improved wt shift to RLE, steady gait, husband able to return demo hands on assist/CGA   Stairs             Wheelchair Mobility     Tilt Bed    Modified Rankin (Stroke Patients Only)       Balance                                            Communication Communication Communication: No apparent difficulties  Cognition Arousal: Alert Behavior During Therapy: WFL for tasks assessed/performed   PT - Cognitive impairments: No apparent impairments                         Following commands: Intact      Cueing Cueing Techniques: Verbal cues  Exercises Total Joint Exercises Heel Slides: AROM, Right, AAROM, 10 reps    General Comments        Pertinent Vitals/Pain Pain Assessment Pain Assessment: Faces Faces Pain Scale: Hurts a little bit Pain Location: R knee and LE Pain Descriptors / Indicators: Constant, Burning, Operative site guarding, Dull, Grimacing Pain Intervention(s): Limited activity within patient's tolerance, Monitored during session, Premedicated before session    Home Living                          Prior Function  PT Goals (current goals can now be found in the care plan section) Acute Rehab PT Goals Patient Stated Goal: back to doing everything without thinking about it PT Goal Formulation: With patient Time For Goal Achievement: 01/08/24 Potential to Achieve Goals: Good Progress towards PT goals: Progressing toward goals    Frequency    7X/week      PT Plan      Co-evaluation              AM-PAC PT 6 Clicks Mobility   Outcome Measure  Help needed turning from your back to your side while in a flat bed without using bedrails?: A Little Help needed moving from lying on your back to sitting on the side of a flat bed without using bedrails?: A Little Help needed moving to and from a bed to a chair (including a  wheelchair)?: A Little Help needed standing up from a chair using your arms (e.g., wheelchair or bedside chair)?: A Little Help needed to walk in hospital room?: A Little Help needed climbing 3-5 steps with a railing? : A Little 6 Click Score: 18    End of Session Equipment Utilized During Treatment: Gait belt Activity Tolerance: Patient tolerated treatment well Patient left: in bed;with call bell/phone within reach;with bed alarm set;with family/visitor present Nurse Communication: Mobility status;Other (comment) (ready for d/c) PT Visit Diagnosis: Unsteadiness on feet (R26.81);Other abnormalities of gait and mobility (R26.89);Muscle weakness (generalized) (M62.81);Difficulty in walking, not elsewhere classified (R26.2);Pain Pain - Right/Left: Right Pain - part of body: Knee;Leg     Time: 8979-8957 PT Time Calculation (min) (ACUTE ONLY): 22 min  Charges:    $Gait Training: 8-22 mins PT General Charges $$ ACUTE PT VISIT: 1 Visit                     Lexington Devine, PT  Acute Rehab Dept [facility]) [phone]  12/27/2023    [facility] 12/27/2023, 1:16 PM

## 2023-12-27 NOTE — Plan of Care (Signed)

## 2023-12-28 ENCOUNTER — Other Ambulatory Visit: Payer: Self-pay

## 2023-12-28 ENCOUNTER — Encounter (HOSPITAL_COMMUNITY): Payer: Self-pay | Admitting: Orthopedic Surgery

## 2023-12-28 ENCOUNTER — Ambulatory Visit (HOSPITAL_BASED_OUTPATIENT_CLINIC_OR_DEPARTMENT_OTHER): Attending: Orthopedic Surgery | Admitting: Physical Therapy

## 2023-12-28 DIAGNOSIS — M6281 Muscle weakness (generalized): Secondary | ICD-10-CM | POA: Insufficient documentation

## 2023-12-28 DIAGNOSIS — R262 Difficulty in walking, not elsewhere classified: Secondary | ICD-10-CM | POA: Diagnosis not present

## 2023-12-28 DIAGNOSIS — M1711 Unilateral primary osteoarthritis, right knee: Secondary | ICD-10-CM | POA: Insufficient documentation

## 2023-12-28 DIAGNOSIS — M25561 Pain in right knee: Secondary | ICD-10-CM | POA: Insufficient documentation

## 2023-12-28 DIAGNOSIS — M25661 Stiffness of right knee, not elsewhere classified: Secondary | ICD-10-CM

## 2023-12-29 ENCOUNTER — Encounter (HOSPITAL_BASED_OUTPATIENT_CLINIC_OR_DEPARTMENT_OTHER): Payer: Self-pay | Admitting: Physical Therapy

## 2023-12-29 NOTE — Discharge Summary (Signed)
 Patient ID: SYDNY SCHNITZLER MRN: [id number] DOB/AGE: [birth year] 69 y.o.  Admit date: 12/25/2023 Discharge date: 12/27/2023  Admission Diagnoses:  Right knee osteoarthritis  Discharge Diagnoses:  Principal Problem:   Total knee replacement status   Past Medical History:  Diagnosis Date   Anxiety    Arthritis    Diverticulitis    Dyspnea    walking up hill   Dysrhythmia    PAF   Hypertension    Meniere disease of right ear    Pneumonia    Pre-diabetes     Surgeries: Procedure(s): ARTHROPLASTY, KNEE, TOTAL on 12/25/2023   Consultants:   Discharged Condition: Improved  Hospital Course: LANNA LABELLA is an 68 y.o. female who was admitted 12/25/2023 for operative treatment ofTotal knee replacement status. Patient has severe unremitting pain that affects sleep, daily activities, and work/hobbies. After pre-op clearance the patient was taken to the operating room on 12/25/2023 and underwent  Procedure(s): ARTHROPLASTY, KNEE, TOTAL.    Patient was given perioperative antibiotics:  Anti-infectives (From admission, onward)    Start     Dose/Rate Route Frequency Ordered Stop   12/25/23 0600  ceFAZolin  (ANCEF ) IVPB 2g/100 mL premix        2 g 200 mL/hr over 30 Minutes Intravenous On call to O.R. 12/25/23 0535 12/25/23 0805        Patient was given sequential compression devices, early ambulation, and chemoprophylaxis to prevent DVT. Patient worked with PT and was meeting their goals regarding safe ambulation and transfers.  Patient benefited maximally from hospital stay and there were no complications.    Recent vital signs: No data found.   Recent laboratory studies: No results for input(s): WBC, HGB, HCT, PLT, NA, K, CL, CO2, BUN, CREATININE, GLUCOSE, INR, CALCIUM  in the last 72 hours.  Invalid input(s): PT, 2   Discharge Medications:   Allergies as of 12/27/2023       Reactions   Codeine Hives, Rash, Other (See Comments)   Intensifies  pain instead of relieving   Adhesive [tape] Rash   Other Other (See Comments)   Certain cholesterol medications give her cramps, she can't remember which ones   Statins    Pentothal [thiopental] Rash        Medication List     STOP taking these medications    CALCIUM -MAGNESIUM-ZINC-D3 PO   OVER THE COUNTER MEDICATION       TAKE these medications    ASHWAGANDHA PO Take 800 mg by mouth at bedtime.   aspirin  81 MG chewable tablet Commonly known as: Aspirin  Childrens Chew 1 tablet (81 mg total) by mouth 2 (two) times daily.   chlorthalidone  25 MG tablet Commonly known as: HYGROTON  Take 25 mg by mouth every 3 (three) days.   Fish Oil 1200 MG Caps Take 1,200 mg by mouth in the morning and at bedtime.   GARLIC PO Take 1 capsule by mouth daily.   Glucosamine Chond MSM Formula Tabs Take 1 capsule by mouth daily.   HYDROmorphone  2 MG tablet Commonly known as: Dilaudid  Take 1 tablet (2 mg total) by mouth every 12 (twelve) hours as needed for up to 14 days for severe pain (pain score 7-10).   LORazepam  0.5 MG tablet Commonly known as: ATIVAN  Take 0.5 mg by mouth at bedtime as needed for sleep.   meloxicam 15 MG tablet Commonly known as: MOBIC Take 15 mg by mouth daily.   methocarbamol  500 MG tablet Commonly known as: ROBAXIN  Take 1 tablet (500 mg total) by  mouth every 8 (eight) hours as needed for muscle spasms.   metoprolol  tartrate 25 MG tablet Commonly known as: LOPRESSOR  Take 1 tablet (25 mg total) by mouth 2 (two) times daily as needed.   multivitamin with minerals Tabs tablet Take 1 tablet by mouth daily.   NON FORMULARY Take 1 Scoop by mouth daily. Multi Peptide Collagen with Vitamin C, Biotin, and Hydroloric acid   ondansetron  4 MG tablet Commonly known as: Zofran  Take 1 tablet (4 mg total) by mouth every 8 (eight) hours as needed for nausea, vomiting or refractory nausea / vomiting.   potassium chloride  10 MEQ tablet Commonly known as:  KLOR-CON  Take 10 mEq by mouth 2 (two) times a week.   PROBIOTIC PO Take 1 capsule by mouth daily.   TURMERIC PLUS BLACK PEPPER EXT PO Take 1 tablet by mouth daily. With Curcuminoids   Ubiquinol 100 MG Caps Take 100 mg by mouth every 3 (three) days.   VITAMIN D -VITAMIN K  PO Take 1 tablet by mouth daily.        Diagnostic Studies: No results found.  Disposition: Discharge disposition: 01-Home or Self Care       Discharge Instructions     Call MD / Call 911   Complete by: As directed    If you experience chest pain or shortness of breath, CALL 911 and be transported to the hospital emergency room.  If you develope a fever above 101 F, pus (white drainage) or increased drainage or redness at the wound, or calf pain, call your surgeon's office.   Constipation Prevention   Complete by: As directed    Drink plenty of fluids.  Prune juice may be helpful.  You may use a stool softener, such as Colace (over the counter) 100 mg twice a day.  Use MiraLax (over the counter) for constipation as needed.   Diet - low sodium heart healthy   Complete by: As directed    Increase activity slowly as tolerated   Complete by: As directed    Post-operative opioid taper instructions:   Complete by: As directed    POST-OPERATIVE OPIOID TAPER INSTRUCTIONS: It is important to wean off of your opioid medication as soon as possible. If you do not need pain medication after your surgery it is ok to stop day one. Opioids include: Codeine, Hydrocodone(Norco, Vicodin), Oxycodone (Percocet, oxycontin ) and hydromorphone  amongst others.  Long term and even short term use of opiods can cause: Increased pain response Dependence Constipation Depression Respiratory depression And more.  Withdrawal symptoms can include Flu like symptoms Nausea, vomiting And more Techniques to manage these symptoms Hydrate well Eat regular healthy meals Stay active Use relaxation techniques(deep breathing,  meditating, yoga) Do Not substitute Alcohol to help with tapering If you have been on opioids for less than two weeks and do not have pain than it is ok to stop all together.  Plan to wean off of opioids This plan should start within one week post op of your joint replacement. Maintain the same interval or time between taking each dose and first decrease the dose.  Cut the total daily intake of opioids by one tablet each day Next start to increase the time between doses. The last dose that should be eliminated is the evening dose.           Follow-up Information     Kay Kemps, MD. Call in 1 week(s).   Specialty: Orthopedic Surgery Why: please call the office at [phone] for appt in  two weeks Contact information: [address] [place] [address] [phone]                  Signed: Rosina JONELLE Calin 12/29/2023, 9:03 AM

## 2023-12-30 ENCOUNTER — Ambulatory Visit (HOSPITAL_BASED_OUTPATIENT_CLINIC_OR_DEPARTMENT_OTHER): Admitting: Physical Therapy

## 2023-12-30 DIAGNOSIS — M25661 Stiffness of right knee, not elsewhere classified: Secondary | ICD-10-CM

## 2023-12-30 DIAGNOSIS — M6281 Muscle weakness (generalized): Secondary | ICD-10-CM | POA: Diagnosis not present

## 2023-12-30 DIAGNOSIS — M1711 Unilateral primary osteoarthritis, right knee: Secondary | ICD-10-CM | POA: Diagnosis not present

## 2023-12-30 DIAGNOSIS — M25561 Pain in right knee: Secondary | ICD-10-CM | POA: Diagnosis not present

## 2023-12-30 DIAGNOSIS — R262 Difficulty in walking, not elsewhere classified: Secondary | ICD-10-CM | POA: Diagnosis not present

## 2023-12-30 NOTE — Therapy (Signed)
 OUTPATIENT PHYSICAL THERAPY LOWER EXTREMITY TREATMENT   Patient Name: Dawn Haley MRN: [id number] DOB:[birth year], 68 y.o., female Today's Date: 12/31/2023  END OF SESSION:  PT End of Session - 12/31/23 1345     Visit Number 2    Number of Visits 22    Date for PT Re-Evaluation 03/07/24    Authorization Type [employer] MCR    PT Start Time 1018    PT Stop Time 1103    PT Time Calculation (min) 45 min    Activity Tolerance Patient tolerated treatment well    Behavior During Therapy WFL for tasks assessed/performed           Past Medical History:  Diagnosis Date   Anxiety    Arthritis    Diverticulitis    Dyspnea    walking up hill   Dysrhythmia    PAF   Hypertension    Meniere disease of right ear    Pneumonia    Pre-diabetes    Past Surgical History:  Procedure Laterality Date   ABDOMINAL HYSTERECTOMY     APPENDECTOMY     COLON SURGERY  2022   Colectomy for colovaginal fistula & diverticulitis   CYST EXCISION Right 11/02/2015   Procedure: RIGHT INDEX EXCISION MASS ;  Surgeon: Franky Curia, MD;  Location: [facility];  Service: Orthopedics;  Laterality: Right;   CYSTOSCOPY Bilateral 11/09/2019   Procedure: CYSTOSCOPY WITH BILATERAL FIREFLY  INJECTION;  Surgeon: Devere Lonni Righter, MD;  Location: [facility];  Service: Urology;  Laterality: Bilateral;   DIAGNOSTIC LAPAROSCOPY     GYN   DILATION AND CURETTAGE OF UTERUS     TONSILLECTOMY     TOTAL KNEE ARTHROPLASTY Right 12/25/2023   Procedure: ARTHROPLASTY, KNEE, TOTAL;  Surgeon: Kay Kemps, MD;  Location: [facility];  Service: Orthopedics;  Laterality: Right;   URETHRAL DILATION     WISDOM TOOTH EXTRACTION     Patient Active Problem List   Diagnosis Date Noted   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   Paroxysmal atrial fibrillation (HCC) 02/17/2023   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   Hypocalcemia 10/27/2019   Sigmoid diverticulitis 10/26/2019   Chest pain  06/06/2015   GERD (gastroesophageal reflux disease) 06/06/2015   Costochondritis 06/06/2015     REFERRING PROVIDER: Kay Kemps, MD   REFERRING DIAG: M17.11 (ICD-10-CM) - Unilateral primary osteoarthritis, right knee  S/p R TKA   THERAPY DIAG:  Right knee pain, unspecified chronicity  Stiffness of right knee, not elsewhere classified  Muscle weakness (generalized)  Difficulty in walking, not elsewhere classified  Rationale for Evaluation and Treatment: Rehabilitation  ONSET DATE: DOS  12/25/23  SUBJECTIVE:   SUBJECTIVE STATEMENT: Pt states she is hurting today.  Pt took her pain meds and mm relaxer this AM.  Pt reports it's tender to touch and burning.  Pt states she was exhausted after prior treatment.  Pt reports she is performing her HEP.   Pt is limited with her normal functional mobility skills including ambulation, transfers, and bed mobility.  Pt is unable to drive.  She is unable to perform the household chores including cooking.  Pt has the bone foam pad at home.  Pt is using the ice machine at home.  PERTINENT HISTORY: R TKA  12/25/23 Arthritis including L knee, A-fib, anxiety, HTN   Pt reports having a fx in R hip 20 years ago.  PAIN:  NPRS:  4/10 current in sitting, 7/10 with ambulation, 9.5/10 worst, 3/10 best  Location:  R knee  PRECAUTIONS: Other: per surgical protocol for TKA    WEIGHT BEARING RESTRICTIONS: none indicated  FALLS:  Has patient fallen in last 6 months? No  LIVING ENVIRONMENT: Lives with: lives with their spouse Lives in: 1 story home Stairs: small step to enter/exit home without rail Has following equipment at home: Single point cane, Walker - 2 wheeled, and Crutches, bedside toilet, shower stool  OCCUPATION: Pt is retired.   PLOF: Independent.  Pt ambulating without an AD.   PATIENT GOALS:  Pt wants to be able to do everything.  Be able to garden.  She wants to be able to get up and down with ease and play with her grandson.      NEXT MD VISIT: 01/07/2024  OBJECTIVE:  Note: Objective measures were completed at Evaluation unless otherwise noted.  DIAGNOSTIC FINDINGS: Pt is post op.   OBSERVATION:  Pt wearing bilat compression stockings.  Pt has an aquacell bandage over incision with a couple of small spots of drainage, nothing abnormal.  Pt has bruising in medial and posterior thigh.   LOWER EXTREMITY ROM:   ROM Right eval Left eval  Hip flexion    Hip extension    Hip abduction    Hip adduction    Hip internal rotation    Hip external rotation    Knee flexion 60 deg PROM   Knee extension 5/4  AROM/PROM   Ankle dorsiflexion    Ankle plantarflexion    Ankle inversion    Ankle eversion     (Blank rows = not tested)   GAIT: Comments: slow gait, step to pattern with FWW.  Decreased Wb'ing through R LE and increased Wb'ing through UE's.                                                                                                                                TREATMENT:   Reviewed pt presentation, pain levels, response to prior Rx, and HEP compliance.    See Observation above.  Pt performed: quad sets with 5 sec hold 2x10 reps in longsiting glute sets with 5 sec hold 2x10 reps in longsitting  Supine hip abd 2x10 Supine SLR x 10 reps with min assist from PT Heel slides in longsitting x 10 and in supine x 10 Pt unable to perform SAQ  Pt received R knee extension PROM per pt and tissue tolerance in supine and R knee flexion PROM per pt and tissue tolerance seated at edge of table. Seated knee extension static stretch with heel propped on stool x 2 mins  Reviewed and updated HEP.  Pt received a HEP handout and was educated in correct form and appropriate frequency.      PATIENT EDUCATION:  Education details:  Instructed pt to not put a pillow under knee.  Educated pt in post op protocol restrictions and expectations.  HEP, objective findings, dx, prognosis, gait, and POC.  PT answered  pt's  questions.  Person educated: Patient Education method: Explanation, Demonstration, Tactile cues, Verbal cues, and Handouts Education comprehension: verbalized understanding, returned demonstration, verbal cues required, and tactile cues required  HOME EXERCISE PROGRAM: Exercises - Supine Quadricep Sets  - 2 x daily - 7 x weekly - 2 sets - 10 reps - 5 seconds hold - ANKLE PUMPS  - 7 x weekly - Seated Passive Knee Extension  - 2-3 x daily - 7 x weekly - 2 reps - 2 minutes hold -Sitting with knee flexed for 3-5 mins comfortably at various times for 6-7 times/day  Access Code: [email or web address] URL: [email or web address] Date: 12/28/2023 Prepared by: Mose Minerva  Exercises - Supine Gluteal Sets  - 2 x daily - 7 x weekly - 2 sets - 10 reps - 5 seconds hold  Updated HEP: - Supine Heel Slide  - 2 - 3 x daily - 7 x weekly - 1 sets - 10 reps  ASSESSMENT:  CLINICAL IMPRESSION: Patient is 5 days s/p R TKA.  PT reviewed HEP and updated HEP.  PT gave pt a handout of supine heel slides and educated pt in correct form, appropriate frequency, and appropriate ROM.  PT instructed pt in correct form and positioning with exercises per protocol.  Pt tolerated AROM heel slides well and was instructed in appropriate ROM.  Pt requires assistance from PT to perform supine SLR and is unable to perform SAQ.  She tolerated knee PROM well.  Pt responded well to Rx stating she was tired after Rx.  PT instructed pt to ice knee when she returned home and pt verbalized agreement.  Pt will benefit from skilled PT per protocol to address impairments and improve overall function.         OBJECTIVE IMPAIRMENTS: Abnormal gait, decreased activity tolerance, decreased endurance, decreased mobility, difficulty walking, decreased ROM, decreased strength, hypomobility, increased edema, impaired flexibility, and pain.   ACTIVITY LIMITATIONS: bending, standing, squatting, stairs, transfers, bed mobility, dressing, and  locomotion level  PARTICIPATION LIMITATIONS: meal prep, cleaning, laundry, driving, shopping, and community activity  PERSONAL FACTORS: 1-2 comorbidities: L knee arthritis, A-fib are also affecting patient's functional outcome.   REHAB POTENTIAL: Good  CLINICAL DECISION MAKING: Stable/uncomplicated  EVALUATION COMPLEXITY: Low   GOALS:   SHORT TERM GOALS:  Pt will be independent and compliant with HEP for improved pain, ROM, strength, and function.  Baseline: Goal status: INITIAL Target date:  01/18/24   2.  Pt will demo a good quad set and be able to perform a supine SLR independently for improved strength and stability with gait. Baseline:  Goal status: INITIAL Target date:  01/11/24  3.  Pt will demo improved R knee AROM to 0 - 95 deg for improved mobility, stiffness, and gait.   Baseline:  Goal status: INITIAL Target date:  01/25/24  4.  Pt will demo improved quality of gait and ambulate with a SPC with good stability.  Baseline:  Goal status: INITIAL Target date:  01/25/24  5.  Pt will able to perform her normal daily transfers with no > than minimal difficulty.   Baseline:  Goal status: INITIAL Target date: 02/22/2024    LONG TERM GOALS: Target date: 03/07/24  Pt will demo improved R knee AROM to 0 - 115 deg for improved mobility and stiffness and for improved performance of stairs.  Baseline:  Goal status: INITIAL  2.  Pt will ambulate with a normalized heel to toe gait without limping.  Baseline:  Goal status: INITIAL  3.  Pt will ambulate community distance without an AD without significant pain and difficulty.  Baseline:  Goal status: INITIAL  4.  Pt will be able to perform household chores without significant pain and difficulty.   Baseline:  Goal status: INITIAL  5.  Pt will be able to perform 4-5 steps with a reciprocal gait with the rail for improved performance of stairs.    Baseline:  Goal status:  INITIAL  6.  Pt will demo improved strength  to 4+/5 in R hip abd, 4+ to 5/5 in Hip flexion, and 5/5 in knee ext and flexion for improved performance of daily functional mobility and to assist with returning to recreational activities.  Baseline:  Goal status: INITIAL   PLAN:  PT FREQUENCY: 2-3x/wk x 3 weeks and 2x/wk afterwards  PT DURATION: 10 weeks  PLANNED INTERVENTIONS: [id number]- PT Re-evaluation, 97750- Physical Performance Testing, 97110-Therapeutic exercises, 97530- Therapeutic activity, 97112- Neuromuscular re-education, 97535- Self Care, [id number]- Manual therapy, [phone]- Gait training, [phone]- Aquatic Therapy, Patient/Family education, Balance training, Stair training, Taping, Joint mobilization, Spinal mobilization, Scar mobilization, DME instructions, Cryotherapy, and Moist heat  PLAN FOR NEXT SESSION: Cont per TKA protocol.  Review and perform HEP.  Knee ROM.   Leigh Minerva III PT, DPT 12/31/23 2:03 PM

## 2023-12-31 ENCOUNTER — Encounter (HOSPITAL_BASED_OUTPATIENT_CLINIC_OR_DEPARTMENT_OTHER): Payer: Self-pay | Admitting: Physical Therapy

## 2024-01-01 DIAGNOSIS — K5900 Constipation, unspecified: Secondary | ICD-10-CM | POA: Diagnosis not present

## 2024-01-01 DIAGNOSIS — Z96651 Presence of right artificial knee joint: Secondary | ICD-10-CM | POA: Diagnosis not present

## 2024-01-01 DIAGNOSIS — Z5181 Encounter for therapeutic drug level monitoring: Secondary | ICD-10-CM | POA: Diagnosis not present
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
## 2024-01-03 ENCOUNTER — Emergency Department (HOSPITAL_COMMUNITY): Admission: EM | Admit: 2024-01-03 | Discharge: 2024-01-03 | Disposition: A | Discharge status: Home / Self Care

## 2024-01-03 ENCOUNTER — Encounter (HOSPITAL_COMMUNITY): Payer: Self-pay | Admitting: Emergency Medicine

## 2024-01-03 ENCOUNTER — Other Ambulatory Visit: Payer: Self-pay

## 2024-01-03 ENCOUNTER — Emergency Department (HOSPITAL_COMMUNITY)

## 2024-01-03 DIAGNOSIS — R112 Nausea with vomiting, unspecified: Secondary | ICD-10-CM | POA: Diagnosis not present

## 2024-01-03 DIAGNOSIS — I10 Essential (primary) hypertension: Secondary | ICD-10-CM | POA: Insufficient documentation

## 2024-01-03 DIAGNOSIS — Z7982 Long term (current) use of aspirin: Secondary | ICD-10-CM | POA: Insufficient documentation

## 2024-01-03 DIAGNOSIS — G459 Transient cerebral ischemic attack, unspecified: Secondary | ICD-10-CM | POA: Diagnosis not present

## 2024-01-03 DIAGNOSIS — R42 Dizziness and giddiness: Secondary | ICD-10-CM | POA: Diagnosis not present

## 2024-01-03 LAB — CBC WITH DIFFERENTIAL/PLATELET
Abs Immature Granulocytes: 0.03 K/uL (ref 0.00–0.07)
Basophils Absolute: 0 K/uL (ref 0.0–0.1)
Basophils Relative: 0 %
Eosinophils Absolute: 0 K/uL (ref 0.0–0.5)
Eosinophils Relative: 0 %
HCT: 38.5 % (ref 36.0–46.0)
Hemoglobin: 12.9 g/dL (ref 12.0–15.0)
Immature Granulocytes: 0 %
Lymphocytes Relative: 11 %
Lymphs Abs: 1 K/uL (ref 0.7–4.0)
MCH: 30.9 pg (ref 26.0–34.0)
MCHC: 33.5 g/dL (ref 30.0–36.0)
MCV: 92.1 fL (ref 80.0–100.0)
Monocytes Absolute: 0.6 K/uL (ref 0.1–1.0)
Monocytes Relative: 6 %
Neutro Abs: 7.8 K/uL — ABNORMAL HIGH (ref 1.7–7.7)
Neutrophils Relative %: 83 %
Platelets: 273 K/uL (ref 150–400)
RBC: 4.18 MIL/uL (ref 3.87–5.11)
RDW: 12.4 % (ref 11.5–15.5)
WBC: 9.5 K/uL (ref 4.0–10.5)
nRBC: 0 % (ref 0.0–0.2)

## 2024-01-03 LAB — BASIC METABOLIC PANEL WITH GFR
Anion gap: 13 (ref 5–15)
BUN: 15 mg/dL (ref 8–23)
CO2: 24 mmol/L (ref 22–32)
Calcium: 9.5 mg/dL (ref 8.9–10.3)
Chloride: 101 mmol/L (ref 98–111)
Creatinine, Ser: 0.69 mg/dL (ref 0.44–1.00)
GFR, Estimated: >60 mL/min (ref >60)
Glucose, Bld: 156 mg/dL — ABNORMAL HIGH (ref 70–99)
Potassium: 4.1 mmol/L (ref 3.5–5.1)
Sodium: 138 mmol/L (ref 135–145)

## 2024-01-03 MED ORDER — MECLIZINE HCL 25 MG PO TABS: 25.0000 mg | ORAL_TABLET | Freq: Three times a day (TID) | ORAL | 0 refills | Status: AC | PRN

## 2024-01-03 MED ORDER — DIPHENHYDRAMINE HCL 50 MG/ML IJ SOLN
12.5000 mg | Freq: Once | INTRAMUSCULAR | Status: AC
  Administered 2024-01-03: 12.5 mg via INTRAVENOUS
  Filled 2024-01-03: qty 1

## 2024-01-03 MED ORDER — LACTATED RINGERS IV BOLUS
1000.0000 mL | Freq: Once | INTRAVENOUS | Status: AC
Start: 2024-01-03 — End: 2024-01-03
  Administered 2024-01-03: 1000 mL via INTRAVENOUS

## 2024-01-03 MED ORDER — METOCLOPRAMIDE HCL 5 MG/ML IJ SOLN
10.0000 mg | Freq: Once | INTRAMUSCULAR | Status: AC
Start: 2024-01-03 — End: 2024-01-03
  Administered 2024-01-03: 10 mg via INTRAVENOUS
  Filled 2024-01-03: qty 2

## 2024-01-03 MED ORDER — MECLIZINE HCL 25 MG PO TABS
25.0000 mg | ORAL_TABLET | Freq: Once | ORAL | Status: AC
Start: 2024-01-03 — End: 2024-01-03
  Administered 2024-01-03: 25 mg via ORAL
  Filled 2024-01-03: qty 1

## 2024-01-03 NOTE — ED Triage Notes (Signed)
 Pt via EMS from home c/o vertigo since 1330pm. Pt reports feeling nauseated and dizzy. Pt has an eye mask in place at time of arrival to mitigate symptoms. Received 4mg  zofran  IM en route. Right TKA 9 days ago with immobilizer in place.

## 2024-01-03 NOTE — Discharge Instructions (Addendum)
 Your blood work was reassuring.  CT scan of the head did not show any concerning findings.  Your dizziness did not improve.  I have prescribed you meclizine  as well.  If you have any worsening symptoms or new symptoms such as difficulty with speech, numbness, weakness in any of your extremities please return for reevaluation.  Follow-up with your primary care doctor sometime this week to ensure that your symptoms are improving.

## 2024-01-03 NOTE — ED Provider Notes (Signed)
 [place] EMERGENCY DEPARTMENT AT [facility] Provider Note   CSN: [id number] Arrival date & time: 01/03/24  [id number]     Patient presents with: Dizziness   Dawn Haley is a 68 y.o. female.   68 year old female presents today for concern of sudden onset of dizziness that started around 1:30 PM.  She states the room around her spinning.  This is similar to her previous episode of vertigo.  Denies any headache, paresthesias, numbness, weakness in her extremity, speech change or vision change.  No chest pain or shortness of breath.  She is status post right knee replacement 9 days ago.  States that is recovering well.  Typically her husband does the Epley maneuver however because of the knee immobilizer he was unable to.  The history is provided by the patient and the spouse. No language interpreter was used.       Prior to Admission medications   Medication Sig Start Date End Date Taking? Authorizing Provider  meclizine  (ANTIVERT ) 25 MG tablet Take 1 tablet (25 mg total) by mouth 3 (three) times daily as needed for dizziness. 01/03/24  Yes Bryor Rami, PA-C  ASHWAGANDHA PO Take 800 mg by mouth at bedtime.    [provider]  aspirin  (ASPIRIN  CHILDRENS) 81 MG chewable tablet Chew 1 tablet (81 mg total) by mouth 2 (two) times daily. 12/25/23 01/24/24  Kay Kemps, MD  Black Pepper-Turmeric (TURMERIC PLUS BLACK PEPPER EXT PO) Take 1 tablet by mouth daily. With Curcuminoids    [provider]  chlorthalidone  (HYGROTON ) 25 MG tablet Take 25 mg by mouth every 3 (three) days. 10/03/22   [provider]  GARLIC PO Take 1 capsule by mouth daily.    [provider]  HYDROmorphone  (DILAUDID ) 2 MG tablet Take 1 tablet (2 mg total) by mouth every 12 (twelve) hours as needed for up to 14 days for severe pain (pain score 7-10). 12/25/23 01/08/24  Kay Kemps, MD  LORazepam  (ATIVAN ) 0.5 MG tablet Take 0.5 mg by mouth at bedtime as needed for sleep.  05/30/15    [provider]  meloxicam (MOBIC) 15 MG tablet Take 15 mg by mouth daily. 01/09/23   [provider]  methocarbamol  (ROBAXIN ) 500 MG tablet Take 1 tablet (500 mg total) by mouth every 8 (eight) hours as needed for muscle spasms. 12/25/23   Kay Kemps, MD  metoprolol  tartrate (LOPRESSOR ) 25 MG tablet Take 1 tablet (25 mg total) by mouth 2 (two) times daily as needed. Patient not taking: Reported on 12/29/2023 01/25/22   Bernis Ernst, PA-C  Misc Natural Products (GLUCOSAMINE CHOND MSM FORMULA) TABS Take 1 capsule by mouth daily.    [provider]  Multiple Vitamin (MULTIVITAMIN WITH MINERALS) TABS tablet Take 1 tablet by mouth daily.    [provider]  NON FORMULARY Take 1 Scoop by mouth daily. Multi Peptide Collagen with Vitamin C, Biotin, and Hydroloric acid    [provider]  Omega-3 Fatty Acids (FISH OIL) 1200 MG CAPS Take 1,200 mg by mouth in the morning and at bedtime.    [provider]  ondansetron  (ZOFRAN ) 4 MG tablet Take 1 tablet (4 mg total) by mouth every 8 (eight) hours as needed for nausea, vomiting or refractory nausea / vomiting. 12/25/23 12/24/24  Kay Kemps, MD  potassium chloride  (KLOR-CON ) 10 MEQ tablet Take 10 mEq by mouth 2 (two) times a week. 10/03/22   [provider]  Probiotic Product (PROBIOTIC PO) Take 1 capsule by mouth  daily.     [provider]  Ubiquinol 100 MG CAPS Take 100 mg by mouth every 3 (three) days.    [provider]  VITAMIN D -VITAMIN K  PO Take 1 tablet by mouth daily.    [provider]    Allergies: Codeine, Adhesive [tape], Oxycodone , Other, Statins, and Pentothal [thiopental]    Review of Systems  Constitutional:  Negative for chills and fever.  Respiratory:  Negative for shortness of breath.   Cardiovascular:  Negative for chest pain.  Neurological:  Positive for dizziness. Negative for light-headedness and headaches.  All other systems reviewed and are  negative.   Updated Vital Signs BP (!) 144/79   Pulse 77   Temp 98.1 F (36.7 C) (Oral)   Resp 16   Ht 5' 10 (1.778 m)   Wt 99.8 kg   SpO2 97%   BMI 31.57 kg/m   Physical Exam Vitals and nursing note reviewed.  Constitutional:      General: She is not in acute distress.    Appearance: Normal appearance. She is not ill-appearing.  HENT:     Head: Normocephalic and atraumatic.     Nose: Nose normal.  Eyes:     Conjunctiva/sclera: Conjunctivae normal.  Cardiovascular:     Rate and Rhythm: Normal rate and regular rhythm.  Pulmonary:     Effort: Pulmonary effort is normal. No respiratory distress.  Abdominal:     Palpations: Abdomen is soft.  Musculoskeletal:        General: No deformity. Normal range of motion.     Cervical back: Normal range of motion.  Skin:    Findings: No rash.  Neurological:     Mental Status: She is alert.     Comments: Range of motion in bilateral upper and lower extremities.  Cranial nerves III through XII intact.  Sensation intact and symmetrical.  No pronator drift. Epley maneuver performed.  There was horizontal nystagmus with this.     (all labs ordered are listed, but only abnormal results are displayed) Labs Reviewed  CBC WITH DIFFERENTIAL/PLATELET - Abnormal; Notable for the following components:      Result Value   Neutro Abs 7.8 (*)    All other components within normal limits  BASIC METABOLIC PANEL WITH GFR - Abnormal; Notable for the following components:   Glucose, Bld 156 (*)    All other components within normal limits    EKG: EKG Interpretation Date/Time:  Sunday January 03 2024 17:34:17 EDT Ventricular Rate:  56 PR Interval:  144 QRS Duration:  112 QT Interval:  466 QTC Calculation: 450 R Axis:   69  Text Interpretation: Sinus rhythm Borderline intraventricular conduction delay Baseline wander in lead(s) V1 V2 V3 V5 V6 Compared with previous EKG from 02/17/2023 Confirmed by Gennaro Bouchard ([id number]) on 01/03/2024 5:37:05  PM  Radiology: CT Head Wo Contrast Result Date: 01/03/2024 CLINICAL DATA:  Transient ischemic attack (TIA) vertigo since 1330pm. Pt reports feeling nauseated and dizzy. Pt has an eye mask in place at time of arrival to mitigate symptoms. EXAM: CT HEAD WITHOUT CONTRAST TECHNIQUE: Contiguous axial images were obtained from the base of the skull through the vertex without intravenous contrast. RADIATION DOSE REDUCTION: This exam was performed according to the departmental dose-optimization program which includes automated exposure control, adjustment of the mA and/or kV according to patient size and/or use of iterative reconstruction technique. COMPARISON:  CT temporal bone 09/24/2022 FINDINGS: Brain: No evidence of large-territorial acute infarction. No parenchymal hemorrhage. No mass  lesion. No extra-axial collection. No mass effect or midline shift. No hydrocephalus. Basilar cisterns are patent. Vascular: No hyperdense vessel. Skull: No acute fracture or focal lesion. Sinuses/Orbits: Paranasal sinuses and mastoid air cells are clear. The orbits are unremarkable. Other: None. IMPRESSION: No acute intracranial abnormality. Electronically Signed   By: Morgane  Naveau M.D.   On: 01/03/2024 19:39     Procedures   Medications Ordered in the ED  metoCLOPramide  (REGLAN ) injection 10 mg (has no administration in time range)  diphenhydrAMINE  (BENADRYL ) injection 12.5 mg (has no administration in time range)  lactated ringers  bolus 1,000 mL (0 mLs Intravenous Stopped 01/03/24 1926)  meclizine  (ANTIVERT ) tablet 25 mg (25 mg Oral Given 01/03/24 1755)                                    Medical Decision Making Amount and/or Complexity of Data Reviewed Labs: ordered. Radiology: ordered.  Risk Prescription drug management.   Medical Decision Making / ED Course   This patient presents to the ED for concern of dizziness, this involves an extensive number of treatment options, and is a complaint that  carries with it a high risk of complications and morbidity.  The differential diagnosis includes vertigo, CVA, TIA, Mnire's disease  MDM: 68 year old female presents today for concern of dizziness. Neurological exam without any focal deficits. Epley maneuver performed.  Will give meclizine , fluids and reevaluate.  Minimal improvement on reevaluation.  CT head obtained.  No acute intracranial finding.  Reevaluated patient again.  Reports significant improvement. Will give dose of Reglan  and Benadryl  but she feels comfortable with plan for discharge.  Will prescribe meclizine .  Later requested to have another Epley maneuver performed. Epley maneuver performed.  Feels significantly improved but states has some residual off sensation.  Offered her further evaluation with MRI.  She states that she does not believe this is a stroke and she feels 100% better than when I came in.  She would like to go home.  She states her husband can help after her.  Discussed close follow-up with PCP and strict return precautions.  She is in agreement.  Discharged in stable condition.   Additional history obtained: -Additional history obtained from husband at bedside -External records from outside source obtained and reviewed including: Chart review including previous notes, labs, imaging, consultation notes   Lab Tests: -I ordered, reviewed, and interpreted labs.   The pertinent results include:   Labs Reviewed  CBC WITH DIFFERENTIAL/PLATELET - Abnormal; Notable for the following components:      Result Value   Neutro Abs 7.8 (*)    All other components within normal limits  BASIC METABOLIC PANEL WITH GFR - Abnormal; Notable for the following components:   Glucose, Bld 156 (*)    All other components within normal limits      EKG  EKG Interpretation Date/Time:  Sunday January 03 2024 17:34:17 EDT Ventricular Rate:  56 PR Interval:  144 QRS Duration:  112 QT Interval:  466 QTC  Calculation: 450 R Axis:   69  Text Interpretation: Sinus rhythm Borderline intraventricular conduction delay Baseline wander in lead(s) V1 V2 V3 V5 V6 Compared with previous EKG from 02/17/2023 Confirmed by Gennaro Bouchard ([id number]) on 01/03/2024 5:37:05 PM         Imaging Studies ordered: I ordered imaging studies including CT head without contrast I independently visualized and interpreted imaging. I agree with the radiologist  interpretation   Medicines ordered and prescription drug management: Meds ordered this encounter  Medications   lactated ringers  bolus 1,000 mL   meclizine  (ANTIVERT ) tablet 25 mg   metoCLOPramide  (REGLAN ) injection 10 mg   diphenhydrAMINE  (BENADRYL ) injection 12.5 mg   meclizine  (ANTIVERT ) 25 MG tablet    Sig: Take 1 tablet (25 mg total) by mouth 3 (three) times daily as needed for dizziness.    Dispense:  15 tablet    Refill:  0    Supervising Provider:   CLEOTILDE, BRIAN [[id number]]    -I have reviewed the patients home medicines and have made adjustments as needed   Reevaluation: After the interventions noted above, I reevaluated the patient and found that they have :improved  Co morbidities that complicate the patient evaluation  Past Medical History:  Diagnosis Date   Anxiety    Arthritis    Diverticulitis    Dyspnea    walking up hill   Dysrhythmia    PAF   Hypertension    Meniere disease of right ear    Pneumonia    Pre-diabetes       Dispostion: Discharged in stable condition.  Return precaution discussed.  Patient voices understanding and is in agreement with plan.  Final diagnoses:  Vertigo    ED Discharge Orders          Ordered    meclizine  (ANTIVERT ) 25 MG tablet  3 times daily PRN        01/03/24 2034               Hildegard Loge, PA-C 01/03/24 2337    Kammerer, Megan L, DO 01/04/24 [phone]

## 2024-01-04 ENCOUNTER — Ambulatory Visit (HOSPITAL_BASED_OUTPATIENT_CLINIC_OR_DEPARTMENT_OTHER): Admitting: Physical Therapy

## 2024-01-04 ENCOUNTER — Encounter (HOSPITAL_BASED_OUTPATIENT_CLINIC_OR_DEPARTMENT_OTHER): Payer: Self-pay | Admitting: Physical Therapy

## 2024-01-04 DIAGNOSIS — M6281 Muscle weakness (generalized): Secondary | ICD-10-CM | POA: Diagnosis not present

## 2024-01-04 DIAGNOSIS — M25561 Pain in right knee: Secondary | ICD-10-CM

## 2024-01-04 DIAGNOSIS — R262 Difficulty in walking, not elsewhere classified: Secondary | ICD-10-CM

## 2024-01-04 DIAGNOSIS — M25661 Stiffness of right knee, not elsewhere classified: Secondary | ICD-10-CM

## 2024-01-04 DIAGNOSIS — M1711 Unilateral primary osteoarthritis, right knee: Secondary | ICD-10-CM | POA: Diagnosis not present

## 2024-01-04 NOTE — Therapy (Signed)
 OUTPATIENT PHYSICAL THERAPY LOWER EXTREMITY TREATMENT   Patient Name: Dawn Haley MRN: [id number] DOB:1956/02/27, 68 y.o., female Today's Date: 01/05/2024  END OF SESSION:  PT End of Session - 01/04/24 1207     Visit Number 3    Number of Visits 22    Date for PT Re-Evaluation 03/07/24    Authorization Type BCBS MCR    PT Start Time 1158    PT Stop Time 1233    PT Time Calculation (min) 35 min    Activity Tolerance Patient tolerated treatment well    Behavior During Therapy WFL for tasks assessed/performed            Past Medical History:  Diagnosis Date   Anxiety    Arthritis    Diverticulitis    Dyspnea    walking up hill   Dysrhythmia    PAF   Hypertension    Meniere disease of right ear    Pneumonia    Pre-diabetes    Past Surgical History:  Procedure Laterality Date   ABDOMINAL HYSTERECTOMY     APPENDECTOMY     COLON SURGERY  2022   Colectomy for colovaginal fistula & diverticulitis   CYST EXCISION Right 11/02/2015   Procedure: RIGHT INDEX EXCISION MASS ;  Surgeon: Franky Curia, MD;  Location: [facility];  Service: Orthopedics;  Laterality: Right;   CYSTOSCOPY Bilateral 11/09/2019   Procedure: CYSTOSCOPY WITH BILATERAL FIREFLY  INJECTION;  Surgeon: Devere Lonni Righter, MD;  Location: [facility];  Service: Urology;  Laterality: Bilateral;   DIAGNOSTIC LAPAROSCOPY     GYN   DILATION AND CURETTAGE OF UTERUS     TONSILLECTOMY     TOTAL KNEE ARTHROPLASTY Right 12/25/2023   Procedure: ARTHROPLASTY, KNEE, TOTAL;  Surgeon: Kay Kemps, MD;  Location: [facility];  Service: Orthopedics;  Laterality: Right;   URETHRAL DILATION     WISDOM TOOTH EXTRACTION     Patient Active Problem List   Diagnosis Date Noted   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   Paroxysmal atrial fibrillation (HCC) 02/17/2023   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   Hypocalcemia 10/27/2019   Sigmoid diverticulitis 10/26/2019   Chest pain  06/06/2015   GERD (gastroesophageal reflux disease) 06/06/2015   Costochondritis 06/06/2015     REFERRING PROVIDER: Kay Kemps, MD   REFERRING DIAG: M17.11 (ICD-10-CM) - Unilateral primary osteoarthritis, right knee  S/p R TKA   THERAPY DIAG:  Right knee pain, unspecified chronicity  Stiffness of right knee, not elsewhere classified  Muscle weakness (generalized)  Difficulty in walking, not elsewhere classified  Rationale for Evaluation and Treatment: Rehabilitation  ONSET DATE: DOS  12/25/23  SUBJECTIVE:   SUBJECTIVE STATEMENT: Pt is 1 week and 3 days s/p R TKA.  Pt had the worst vertigo attack she's ever had yesterday at 1:30 PM.  Pt was vomiting and unable to get out of the chair.  Pt was taken to [facility]  ED by ambulance.  Pt received shots and had an IV.  Pt returned home by the evening.  Pt is doing much better now.  She is not dizzy now, but does feel  a little off-kilter.  Pt is fatigued. Pt states she was sore and exhausted after prior Rx.  Pt reports compliance with HEP.  Pt was able to get in and out of the car without the strap today.  Pt is using the bone foam pad and also using ice at home.   PERTINENT HISTORY: R  TKA  12/25/23 Arthritis including L knee, A-fib, anxiety, HTN   Pt reports having a fx in R hip 20 years ago.  PAIN:  NPRS:  2-3/10 current in sitting, 7/10 with ambulation, 9.5/10 worst, 3/10 best Location:  R knee  PRECAUTIONS: Other: per surgical protocol for TKA    WEIGHT BEARING RESTRICTIONS: none indicated  FALLS:  Has patient fallen in last 6 months? No  LIVING ENVIRONMENT: Lives with: lives with their spouse Lives in: 1 story home Stairs: small step to enter/exit home without rail Has following equipment at home: Single point cane, Walker - 2 wheeled, and Crutches, bedside toilet, shower stool  OCCUPATION: Pt is retired.   PLOF: Independent.  Pt ambulating without an AD.   PATIENT GOALS:  Pt wants to be able to do  everything.  Be able to garden.  She wants to be able to get up and down with ease and play with her grandson.     NEXT MD VISIT: 01/07/2024  OBJECTIVE:  Note: Objective measures were completed at Evaluation unless otherwise noted.  DIAGNOSTIC FINDINGS: Pt is post op.   OBSERVATION:  Pt wearing bilat compression stockings.  Pt has an aquacell bandage over incision with a couple of small spots of drainage, nothing abnormal.  Pt has bruising in medial and posterior thigh.   LOWER EXTREMITY ROM:   ROM Right eval Left eval  Hip flexion    Hip extension    Hip abduction    Hip adduction    Hip internal rotation    Hip external rotation    Knee flexion 60 deg PROM   Knee extension 5/4  AROM/PROM   Ankle dorsiflexion    Ankle plantarflexion    Ankle inversion    Ankle eversion     (Blank rows = not tested)   GAIT: Comments: slow gait, step to pattern with FWW.  Decreased Wb'ing through R LE and increased Wb'ing through UE's.                                                                                                                                TREATMENT:   Reviewed pt presentation, pain levels, response to prior Rx, and HEP compliance.    Pt received R knee flexion and extension PROM per pt and tissue tolerance seated at edge of table.  Pt performed: quad sets with 5 sec hold x5 reps and with heel propped x10 reps in longsiting  Longsitting hip abd 2x10 Heel slides in longsitting 2x10  SAQ with assistance x10  Seated heel slides x 10 reps Seated knee extension static stretch with heel propped on stool x 2 mins  Reviewed and updated HEP.  Pt received a HEP handout and was educated in correct form and appropriate frequency.      PATIENT EDUCATION:  Education details:  Instructed pt to not put a pillow under knee.  Educated pt in post op protocol restrictions and expectations.  HEP, objective findings, dx, prognosis, gait, and POC.  PT answered pt's questions.   Person educated: Patient Education method: Explanation, Demonstration, Tactile cues, Verbal cues, and Handouts Education comprehension: verbalized understanding, returned demonstration, verbal cues required, and tactile cues required  HOME EXERCISE PROGRAM: Exercises - Supine Quadricep Sets  - 2 x daily - 7 x weekly - 2 sets - 10 reps - 5 seconds hold - ANKLE PUMPS  - 7 x weekly - Seated Passive Knee Extension  - 2-3 x daily - 7 x weekly - 2 reps - 2 minutes hold -Sitting with knee flexed for 3-5 mins comfortably at various times for 6-7 times/day  Access Code: [email or web address] URL: [email or web address] Date: 12/28/2023 Prepared by: Mose Minerva  Exercises - Supine Gluteal Sets  - 2 x daily - 7 x weekly - 2 sets - 10 reps - 5 seconds hold - Supine Heel Slide  - 2 - 3 x daily - 7 x weekly - 1 sets - 10 reps  ASSESSMENT:  CLINICAL IMPRESSION: Pt is doing much better now since going to the ED after having a severe episode of vertigo.  Pt reports she was able to perform car transfers without the strap today.  She is improving with knee flexion ROM.  She was able to sit on the table with her leg hanging down comfortably.  Pt had increased knee flexion PROM based on visual observation.  PT had pt perform exercises in longsitting instead of supine due to recent bout of vertigo.  Pt tolerated knee PROM and exercises per protocol well.  She continues to have quad weakness and has decreased ability to perform SAQ.  Pt performed SAQ with PT assistance.  She responded well to Rx and will benefit from continued skilled PT per protocol to improve pain, strength, ROM, mobility and overall function.    OBJECTIVE IMPAIRMENTS: Abnormal gait, decreased activity tolerance, decreased endurance, decreased mobility, difficulty walking, decreased ROM, decreased strength, hypomobility, increased edema, impaired flexibility, and pain.   ACTIVITY LIMITATIONS: bending, standing, squatting, stairs,  transfers, bed mobility, dressing, and locomotion level  PARTICIPATION LIMITATIONS: meal prep, cleaning, laundry, driving, shopping, and community activity  PERSONAL FACTORS: 1-2 comorbidities: L knee arthritis, A-fib are also affecting patient's functional outcome.   REHAB POTENTIAL: Good  CLINICAL DECISION MAKING: Stable/uncomplicated  EVALUATION COMPLEXITY: Low   GOALS:   SHORT TERM GOALS:  Pt will be independent and compliant with HEP for improved pain, ROM, strength, and function.  Baseline: Goal status: INITIAL Target date:  01/18/24   2.  Pt will demo a good quad set and be able to perform a supine SLR independently for improved strength and stability with gait. Baseline:  Goal status: INITIAL Target date:  01/11/24  3.  Pt will demo improved R knee AROM to 0 - 95 deg for improved mobility, stiffness, and gait.   Baseline:  Goal status: INITIAL Target date:  01/25/24  4.  Pt will demo improved quality of gait and ambulate with a SPC with good stability.  Baseline:  Goal status: INITIAL Target date:  01/25/24  5.  Pt will able to perform her normal daily transfers with no > than minimal difficulty.   Baseline:  Goal status: INITIAL Target date: 02/22/2024    LONG TERM GOALS: Target date: 03/07/24  Pt will demo improved R knee AROM to 0 - 115 deg for improved mobility and stiffness and for improved performance of stairs.  Baseline:  Goal status: INITIAL  2.  Pt will ambulate with  a normalized heel to toe gait without limping.  Baseline:  Goal status: INITIAL  3.  Pt will ambulate community distance without an AD without significant pain and difficulty.  Baseline:  Goal status: INITIAL  4.  Pt will be able to perform household chores without significant pain and difficulty.   Baseline:  Goal status: INITIAL  5.  Pt will be able to perform 4-5 steps with a reciprocal gait with the rail for improved performance of stairs.    Baseline:  Goal status:   INITIAL  6.  Pt will demo improved strength to 4+/5 in R hip abd, 4+ to 5/5 in Hip flexion, and 5/5 in knee ext and flexion for improved performance of daily functional mobility and to assist with returning to recreational activities.  Baseline:  Goal status: INITIAL   PLAN:  PT FREQUENCY: 2-3x/wk x 3 weeks and 2x/wk afterwards  PT DURATION: 10 weeks  PLANNED INTERVENTIONS: 97164- PT Re-evaluation, 97750- Physical Performance Testing, 97110-Therapeutic exercises, 97530- Therapeutic activity, 97112- Neuromuscular re-education, 97535- Self Care, [id number]- Manual therapy, [phone]- Gait training, [phone]- Aquatic Therapy, Patient/Family education, Balance training, Stair training, Taping, Joint mobilization, Spinal mobilization, Scar mobilization, DME instructions, Cryotherapy, and Moist heat  PLAN FOR NEXT SESSION: Cont per TKA protocol.  Review and perform HEP.  Knee ROM.   Leigh Minerva III PT, DPT 01/05/24 5:46 PM

## 2024-01-06 ENCOUNTER — Ambulatory Visit (HOSPITAL_BASED_OUTPATIENT_CLINIC_OR_DEPARTMENT_OTHER): Admitting: Physical Therapy

## 2024-01-06 ENCOUNTER — Encounter (HOSPITAL_BASED_OUTPATIENT_CLINIC_OR_DEPARTMENT_OTHER): Payer: Self-pay | Admitting: Physical Therapy

## 2024-01-06 DIAGNOSIS — M25561 Pain in right knee: Secondary | ICD-10-CM | POA: Diagnosis not present

## 2024-01-06 DIAGNOSIS — H8112 Benign paroxysmal vertigo, left ear: Secondary | ICD-10-CM | POA: Diagnosis not present

## 2024-01-06 DIAGNOSIS — M6281 Muscle weakness (generalized): Secondary | ICD-10-CM | POA: Diagnosis not present

## 2024-01-06 DIAGNOSIS — M1711 Unilateral primary osteoarthritis, right knee: Secondary | ICD-10-CM | POA: Diagnosis not present

## 2024-01-06 DIAGNOSIS — R262 Difficulty in walking, not elsewhere classified: Secondary | ICD-10-CM

## 2024-01-06 DIAGNOSIS — M25661 Stiffness of right knee, not elsewhere classified: Secondary | ICD-10-CM

## 2024-01-06 NOTE — Therapy (Signed)
 OUTPATIENT PHYSICAL THERAPY LOWER EXTREMITY TREATMENT   Patient Name: Dawn Haley MRN: [id number] DOB:[birth year], 68 y.o., female Today's Date: 01/06/2024  END OF SESSION:  PT End of Session - 01/06/24 1134     Visit Number 4    Number of Visits 22    Date for PT Re-Evaluation 03/07/24    Authorization Type BCBS MCR    PT Start Time 1100    PT Stop Time 1143    PT Time Calculation (min) 43 min    Activity Tolerance Patient tolerated treatment well    Behavior During Therapy WFL for tasks assessed/performed             Past Medical History:  Diagnosis Date   Anxiety    Arthritis    Diverticulitis    Dyspnea    walking up hill   Dysrhythmia    PAF   Hypertension    Meniere disease of right ear    Pneumonia    Pre-diabetes    Past Surgical History:  Procedure Laterality Date   ABDOMINAL HYSTERECTOMY     APPENDECTOMY     COLON SURGERY  2022   Colectomy for colovaginal fistula & diverticulitis   CYST EXCISION Right 11/02/2015   Procedure: RIGHT INDEX EXCISION MASS ;  Surgeon: Franky Curia, MD;  Location: [facility];  Service: Orthopedics;  Laterality: Right;   CYSTOSCOPY Bilateral 11/09/2019   Procedure: CYSTOSCOPY WITH BILATERAL FIREFLY  INJECTION;  Surgeon: Devere Lonni Righter, MD;  Location: [facility];  Service: Urology;  Laterality: Bilateral;   DIAGNOSTIC LAPAROSCOPY     GYN   DILATION AND CURETTAGE OF UTERUS     TONSILLECTOMY     TOTAL KNEE ARTHROPLASTY Right 12/25/2023   Procedure: ARTHROPLASTY, KNEE, TOTAL;  Surgeon: Kay Kemps, MD;  Location: [facility];  Service: Orthopedics;  Laterality: Right;   URETHRAL DILATION     WISDOM TOOTH EXTRACTION     Patient Active Problem List   Diagnosis Date Noted   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   Paroxysmal atrial fibrillation (HCC) 02/17/2023   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   Hypocalcemia 10/27/2019   Sigmoid diverticulitis 10/26/2019   Chest  pain 06/06/2015   GERD (gastroesophageal reflux disease) 06/06/2015   Costochondritis 06/06/2015     REFERRING PROVIDER: Kay Kemps, MD   REFERRING DIAG: M17.11 (ICD-10-CM) - Unilateral primary osteoarthritis, right knee  S/p R TKA   THERAPY DIAG:  Right knee pain, unspecified chronicity  Stiffness of right knee, not elsewhere classified  Muscle weakness (generalized)  Difficulty in walking, not elsewhere classified  Rationale for Evaluation and Treatment: Rehabilitation  ONSET DATE: DOS  12/25/23  SUBJECTIVE:   SUBJECTIVE STATEMENT: The patient continues to have difficulty with her achillies area. She reports it is getting worse. She feels it at night.    Pt is using the bone foam pad and also using ice at home.   PERTINENT HISTORY: R TKA  12/25/23 Arthritis including L knee, A-fib, anxiety, HTN   Pt reports having a fx in R hip 20 years ago.  PAIN:  NPRS:  2-3/10 current in sitting, 7/10 with ambulation, 9.5/10 worst, 3/10 best Location:  R knee  PRECAUTIONS: Other: per surgical protocol for TKA    WEIGHT BEARING RESTRICTIONS: none indicated  FALLS:  Has patient fallen in last 6 months? No  LIVING ENVIRONMENT: Lives with: lives with their spouse Lives in: 1 story home Stairs: small step to enter/exit home without rail Has following  equipment at home: Single point cane, Walker - 2 wheeled, and Crutches, bedside toilet, shower stool  OCCUPATION: Pt is retired.   PLOF: Independent.  Pt ambulating without an AD.   PATIENT GOALS:  Pt wants to be able to do everything.  Be able to garden.  She wants to be able to get up and down with ease and play with her grandson.     NEXT MD VISIT: 01/07/2024  OBJECTIVE:  Note: Objective measures were completed at Evaluation unless otherwise noted.  DIAGNOSTIC FINDINGS: Pt is post op.   OBSERVATION:  Pt wearing bilat compression stockings.  Pt has an aquacell bandage over incision with a couple of small spots of  drainage, nothing abnormal.  Pt has bruising in medial and posterior thigh.   LOWER EXTREMITY ROM:   ROM Right eval Left eval  Hip flexion    Hip extension    Hip abduction    Hip adduction    Hip internal rotation    Hip external rotation    Knee flexion 60 deg PROM   Knee extension 5/4  AROM/PROM   Ankle dorsiflexion    Ankle plantarflexion    Ankle inversion    Ankle eversion     (Blank rows = not tested)   GAIT: Comments: slow gait, step to pattern with FWW.  Decreased Wb'ing through R LE and increased Wb'ing through UE's.                                                                                                                                TREATMENT:   8/27 Manual: Trigger point release to gastroc Review of self mobilization  Extension ROM with distraction  Trigger point release to posterior knee  Trigger point release to the quad  Nu-step 5 min L1 for self stretch  SLR 3x5 improved pain with reps  SAQ x7 1 set x3 and had Ian increase in pain  LAQ x attempted but had pain    Last visit:  Reviewed pt presentation, pain levels, response to prior Rx, and HEP compliance.    Pt received R knee flexion and extension PROM per pt and tissue tolerance seated at edge of table.  Pt performed: quad sets with 5 sec hold x5 reps and with heel propped x10 reps in longsiting  Longsitting hip abd 2x10 Heel slides in longsitting 2x10  SAQ with assistance x10  Seated heel slides x 10 reps Seated knee extension static stretch with heel propped on stool x 2 mins  Reviewed and updated HEP.  Pt received a HEP handout and was educated in correct form and appropriate frequency.      PATIENT EDUCATION:  Education details:  Instructed pt to not put a pillow under knee.  Educated pt in post op protocol restrictions and expectations.  HEP, objective findings, dx, prognosis, gait, and POC.  PT answered pt's questions.  Person educated: Patient Education method:  Explanation, Demonstration, Tactile  cues, Verbal cues, and Handouts Education comprehension: verbalized understanding, returned demonstration, verbal cues required, and tactile cues required  HOME EXERCISE PROGRAM: Exercises - Supine Quadricep Sets  - 2 x daily - 7 x weekly - 2 sets - 10 reps - 5 seconds hold - ANKLE PUMPS  - 7 x weekly - Seated Passive Knee Extension  - 2-3 x daily - 7 x weekly - 2 reps - 2 minutes hold -Sitting with knee flexed for 3-5 mins comfortably at various times for 6-7 times/day  Access Code: [email or web address] URL: [email or web address] Date: 12/28/2023 Prepared by: Mose Minerva  Exercises - Supine Gluteal Sets  - 2 x daily - 7 x weekly - 2 sets - 10 reps - 5 seconds hold - Supine Heel Slide  - 2 - 3 x daily - 7 x weekly - 1 sets - 10 reps  ASSESSMENT:  CLINICAL IMPRESSION: Patient was evaluated Achilles pain today.  She is having a hard time putting her weight on her leg.  We kept her exercises on the table today.  She had some pain with base exercises.  She had increased pain with short arc quad and long arc quad.  She was only able to do 5 straight leg raises.  Her straight leg raises improved as she practiced.  Her range of motion is improving well.  Her total arc was measured at 4 to 100 degrees today.  She has significant tightness in her gastroc today.  She was shown how to do self soft tissue mobilization.  She reported improved motion in the dorsiflexion and reduce pain with soft tissue mobilization of her calf.  Her calf does not appear to be red or swollen.  All of her pain is focused in her Achilles area.  There are no signs of a blood clot.   Pt is doing much better now since going to the ED after having a severe episode of vertigo.  Pt reports she was able to perform car transfers without the strap today.  She is improving with knee flexion ROM.  She was able to sit on the table with her leg hanging down comfortably.  Pt had increased knee  flexion PROM based on visual observation.  PT had pt perform exercises in longsitting instead of supine due to recent bout of vertigo.  Pt tolerated knee PROM and exercises per protocol well.  She continues to have quad weakness and has decreased ability to perform SAQ.  Pt performed SAQ with PT assistance.  She responded well to Rx and will benefit from continued skilled PT per protocol to improve pain, strength, ROM, mobility and overall function.    OBJECTIVE IMPAIRMENTS: Abnormal gait, decreased activity tolerance, decreased endurance, decreased mobility, difficulty walking, decreased ROM, decreased strength, hypomobility, increased edema, impaired flexibility, and pain.   ACTIVITY LIMITATIONS: bending, standing, squatting, stairs, transfers, bed mobility, dressing, and locomotion level  PARTICIPATION LIMITATIONS: meal prep, cleaning, laundry, driving, shopping, and community activity  PERSONAL FACTORS: 1-2 comorbidities: L knee arthritis, A-fib are also affecting patient's functional outcome.   REHAB POTENTIAL: Good  CLINICAL DECISION MAKING: Stable/uncomplicated  EVALUATION COMPLEXITY: Low   GOALS:   SHORT TERM GOALS:  Pt will be independent and compliant with HEP for improved pain, ROM, strength, and function.  Baseline: Goal status: INITIAL Target date:  01/18/24   2.  Pt will demo a good quad set and be able to perform a supine SLR independently for improved strength and stability with gait. Baseline:  Goal status: INITIAL Target  date:  01/11/24  3.  Pt will demo improved R knee AROM to 0 - 95 deg for improved mobility, stiffness, and gait.   Baseline:  Goal status: INITIAL Target date:  01/25/24  4.  Pt will demo improved quality of gait and ambulate with a SPC with good stability.  Baseline:  Goal status: INITIAL Target date:  01/25/24  5.  Pt will able to perform her normal daily transfers with no > than minimal difficulty.   Baseline:  Goal status: INITIAL Target  date: 02/22/2024    LONG TERM GOALS: Target date: 03/07/24  Pt will demo improved R knee AROM to 0 - 115 deg for improved mobility and stiffness and for improved performance of stairs.  Baseline:  Goal status: INITIAL  2.  Pt will ambulate with a normalized heel to toe gait without limping.  Baseline:  Goal status: INITIAL  3.  Pt will ambulate community distance without an AD without significant pain and difficulty.  Baseline:  Goal status: INITIAL  4.  Pt will be able to perform household chores without significant pain and difficulty.   Baseline:  Goal status: INITIAL  5.  Pt will be able to perform 4-5 steps with a reciprocal gait with the rail for improved performance of stairs.    Baseline:  Goal status:  INITIAL  6.  Pt will demo improved strength to 4+/5 in R hip abd, 4+ to 5/5 in Hip flexion, and 5/5 in knee ext and flexion for improved performance of daily functional mobility and to assist with returning to recreational activities.  Baseline:  Goal status: INITIAL   PLAN:  PT FREQUENCY: 2-3x/wk x 3 weeks and 2x/wk afterwards  PT DURATION: 10 weeks  PLANNED INTERVENTIONS: 97164- PT Re-evaluation, 97750- Physical Performance Testing, 97110-Therapeutic exercises, 97530- Therapeutic activity, 97112- Neuromuscular re-education, 97535- Self Care, 02859- Manual therapy, [phone]- Gait training, [phone]- Aquatic Therapy, Patient/Family education, Balance training, Stair training, Taping, Joint mobilization, Spinal mobilization, Scar mobilization, DME instructions, Cryotherapy, and Moist heat  PLAN FOR NEXT SESSION: Cont per TKA protocol.  Review and perform HEP.  Knee ROM.   Leigh Minerva III PT, DPT 01/06/24 11:36 AM

## 2024-01-07 DIAGNOSIS — Z4889 Encounter for other specified surgical aftercare: Secondary | ICD-10-CM | POA: Diagnosis not present

## 2024-01-08 ENCOUNTER — Encounter (HOSPITAL_BASED_OUTPATIENT_CLINIC_OR_DEPARTMENT_OTHER): Payer: Self-pay | Admitting: Physical Therapy

## 2024-01-08 ENCOUNTER — Ambulatory Visit (HOSPITAL_BASED_OUTPATIENT_CLINIC_OR_DEPARTMENT_OTHER): Payer: Self-pay | Admitting: Physical Therapy

## 2024-01-08 DIAGNOSIS — M1711 Unilateral primary osteoarthritis, right knee: Secondary | ICD-10-CM | POA: Diagnosis not present

## 2024-01-08 DIAGNOSIS — M25661 Stiffness of right knee, not elsewhere classified: Secondary | ICD-10-CM

## 2024-01-08 DIAGNOSIS — M25561 Pain in right knee: Secondary | ICD-10-CM | POA: Diagnosis not present

## 2024-01-08 DIAGNOSIS — M6281 Muscle weakness (generalized): Secondary | ICD-10-CM | POA: Diagnosis not present

## 2024-01-08 DIAGNOSIS — R262 Difficulty in walking, not elsewhere classified: Secondary | ICD-10-CM
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
## 2024-01-12 ENCOUNTER — Ambulatory Visit (HOSPITAL_BASED_OUTPATIENT_CLINIC_OR_DEPARTMENT_OTHER): Attending: Orthopedic Surgery | Admitting: Physical Therapy

## 2024-01-12 ENCOUNTER — Encounter (HOSPITAL_BASED_OUTPATIENT_CLINIC_OR_DEPARTMENT_OTHER): Payer: Self-pay | Admitting: Physical Therapy

## 2024-01-12 DIAGNOSIS — M25561 Pain in right knee: Secondary | ICD-10-CM | POA: Insufficient documentation

## 2024-01-12 DIAGNOSIS — R262 Difficulty in walking, not elsewhere classified: Secondary | ICD-10-CM | POA: Diagnosis not present

## 2024-01-12 DIAGNOSIS — M25661 Stiffness of right knee, not elsewhere classified: Secondary | ICD-10-CM | POA: Diagnosis not present

## 2024-01-12 DIAGNOSIS — M6281 Muscle weakness (generalized): Secondary | ICD-10-CM | POA: Insufficient documentation

## 2024-01-14 ENCOUNTER — Ambulatory Visit (HOSPITAL_BASED_OUTPATIENT_CLINIC_OR_DEPARTMENT_OTHER): Admitting: Physical Therapy

## 2024-01-14 ENCOUNTER — Encounter (HOSPITAL_BASED_OUTPATIENT_CLINIC_OR_DEPARTMENT_OTHER): Payer: Self-pay | Admitting: Physical Therapy

## 2024-01-14 DIAGNOSIS — M6281 Muscle weakness (generalized): Secondary | ICD-10-CM | POA: Diagnosis not present

## 2024-01-14 DIAGNOSIS — M25561 Pain in right knee: Secondary | ICD-10-CM

## 2024-01-14 DIAGNOSIS — M25661 Stiffness of right knee, not elsewhere classified: Secondary | ICD-10-CM | POA: Diagnosis not present

## 2024-01-14 DIAGNOSIS — R262 Difficulty in walking, not elsewhere classified: Secondary | ICD-10-CM
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
## 2024-01-15 ENCOUNTER — Encounter (HOSPITAL_BASED_OUTPATIENT_CLINIC_OR_DEPARTMENT_OTHER): Payer: Self-pay | Admitting: Physical Therapy

## 2024-01-15 ENCOUNTER — Ambulatory Visit (HOSPITAL_BASED_OUTPATIENT_CLINIC_OR_DEPARTMENT_OTHER): Payer: Self-pay | Admitting: Physical Therapy

## 2024-01-15 DIAGNOSIS — M25561 Pain in right knee: Secondary | ICD-10-CM

## 2024-01-15 DIAGNOSIS — R262 Difficulty in walking, not elsewhere classified: Secondary | ICD-10-CM

## 2024-01-15 DIAGNOSIS — M25661 Stiffness of right knee, not elsewhere classified: Secondary | ICD-10-CM | POA: Diagnosis not present

## 2024-01-15 DIAGNOSIS — M6281 Muscle weakness (generalized): Secondary | ICD-10-CM | POA: Diagnosis not present
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
## 2024-01-18 ENCOUNTER — Ambulatory Visit (HOSPITAL_BASED_OUTPATIENT_CLINIC_OR_DEPARTMENT_OTHER): Admitting: Physical Therapy

## 2024-01-18 ENCOUNTER — Encounter (HOSPITAL_BASED_OUTPATIENT_CLINIC_OR_DEPARTMENT_OTHER): Payer: Self-pay | Admitting: Physical Therapy

## 2024-01-18 DIAGNOSIS — R262 Difficulty in walking, not elsewhere classified: Secondary | ICD-10-CM | POA: Diagnosis not present

## 2024-01-18 DIAGNOSIS — M6281 Muscle weakness (generalized): Secondary | ICD-10-CM | POA: Diagnosis not present

## 2024-01-18 DIAGNOSIS — M25661 Stiffness of right knee, not elsewhere classified: Secondary | ICD-10-CM | POA: Diagnosis not present

## 2024-01-18 DIAGNOSIS — M25561 Pain in right knee: Secondary | ICD-10-CM | POA: Diagnosis not present

## 2024-01-18 NOTE — Therapy (Signed)
 OUTPATIENT PHYSICAL THERAPY LOWER EXTREMITY TREATMENT   Patient Name: Dawn Haley MRN: [id number] DOB:[birth year], 68 y.o., female Today's Date: 01/18/2024  END OF SESSION:  PT End of Session - 01/18/24 1255     Visit Number 9    Number of Visits 22    Date for PT Re-Evaluation 03/07/24    Authorization Type BCBS MCR    PT Start Time 1145    PT Stop Time 1228    PT Time Calculation (min) 43 min    Activity Tolerance Patient tolerated treatment well    Behavior During Therapy WFL for tasks assessed/performed                 Past Medical History:  Diagnosis Date   Anxiety    Arthritis    Diverticulitis    Dyspnea    walking up hill   Dysrhythmia    PAF   Hypertension    Meniere disease of right ear    Pneumonia    Pre-diabetes    Past Surgical History:  Procedure Laterality Date   ABDOMINAL HYSTERECTOMY     APPENDECTOMY     COLON SURGERY  2022   Colectomy for colovaginal fistula & diverticulitis   CYST EXCISION Right 11/02/2015   Procedure: RIGHT INDEX EXCISION MASS ;  Surgeon: Franky Curia, MD;  Location: [facility];  Service: Orthopedics;  Laterality: Right;   CYSTOSCOPY Bilateral 11/09/2019   Procedure: CYSTOSCOPY WITH BILATERAL FIREFLY  INJECTION;  Surgeon: Devere Lonni Righter, MD;  Location: [facility];  Service: Urology;  Laterality: Bilateral;   DIAGNOSTIC LAPAROSCOPY     GYN   DILATION AND CURETTAGE OF UTERUS     TONSILLECTOMY     TOTAL KNEE ARTHROPLASTY Right 12/25/2023   Procedure: ARTHROPLASTY, KNEE, TOTAL;  Surgeon: Kay Kemps, MD;  Location: [facility];  Service: Orthopedics;  Laterality: Right;   URETHRAL DILATION     WISDOM TOOTH EXTRACTION     Patient Active Problem List   Diagnosis Date Noted   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   Paroxysmal atrial fibrillation (HCC) 02/17/2023   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   Hypocalcemia 10/27/2019   Sigmoid diverticulitis 10/26/2019    Chest pain 06/06/2015   GERD (gastroesophageal reflux disease) 06/06/2015   Costochondritis 06/06/2015     REFERRING PROVIDER: Kay Kemps, MD   REFERRING DIAG: M17.11 (ICD-10-CM) - Unilateral primary osteoarthritis, right knee  S/p R TKA   THERAPY DIAG:  Right knee pain, unspecified chronicity  Stiffness of right knee, not elsewhere classified  Muscle weakness (generalized)  Difficulty in walking, not elsewhere classified  Rationale for Evaluation and Treatment: Rehabilitation  ONSET DATE: DOS  12/25/23  SUBJECTIVE:   SUBJECTIVE STATEMENT:  The patient reports her knee felt very good after the last visit but got stiff once she got home.  PERTINENT HISTORY: R TKA  12/25/23 Arthritis including L knee, A-fib, anxiety, HTN   Pt reports having a fx in R hip 20 years ago.  PAIN:  NPRS:  6-7/10  Location:  R knee Type of pain: hot like its burning, incision hurts to touch, tight Trying to move it makes it worse, ice helps   PRECAUTIONS: Other: per surgical protocol for TKA    WEIGHT BEARING RESTRICTIONS: none indicated  FALLS:  Has patient fallen in last 6 months? No  LIVING ENVIRONMENT: Lives with: lives with their spouse Lives in: 1 story home Stairs: small step to enter/exit home without rail Has following equipment  at home: Single point cane, Walker - 2 wheeled, and Crutches, bedside toilet, shower stool  OCCUPATION: Pt is retired.   PLOF: Independent.  Pt ambulating without an AD.   PATIENT GOALS:  Pt wants to be able to do everything.  Be able to garden.  She wants to be able to get up and down with ease and play with her grandson.     NEXT MD VISIT: 01/07/2024  OBJECTIVE:  Note: Objective measures were completed at Evaluation unless otherwise noted.  DIAGNOSTIC FINDINGS: Pt is post op.   OBSERVATION:  Pt wearing bilat compression socks.  Aquacel bandage has been removed.  Incision intact and dry.  No signs of infection.     LOWER EXTREMITY  ROM:   ROM Right eval Right 8/29 Right 01/15/24 09/08 Right   Hip flexion      Hip extension      Hip abduction      Hip adduction      Hip internal rotation      Hip external rotation      Knee flexion 60 deg PROM 98 deg PROM Seated AROM 100* 107 Passive   Knee extension 5/4  AROM/PROM 4 deg AROM Seated LAQ 19*, supine quad set with heel prop 6*   Ankle dorsiflexion      Ankle plantarflexion      Ankle inversion      Ankle eversion       (Blank rows = not tested)   GAIT: Comments: slow gait, step to pattern with FWW.  Decreased Wb'ing through R LE and increased Wb'ing through UE's.                                                                                                                                TREATMENT:   9/8  Manual: Nu-step for range:  4 min L5   Manual: Trigger point release to gastroc Review of self mobilization  Extension ROM with distraction  Trigger point release to posterior knee  Trigger point release to the quad   There-ex:  SLR 2x10  SAQ 3x10 2#   Gait training: reviewed use of a cane using a gait belt for safety. Used a step through pattern until she fatigued.      01/15/24  Scifit bike seat 10 x8 minutes partial rotations for ROM and w/u  ROM and goals  Rolling pin to R quad above level of incision pressure as tolerated   SLRs 2x10 R  Bridges 2x5  SAQs 2# 12x3 second holds HS stretches R LE 3x30 seconds  Shuttle BLE press 50# x12 eccentric lower for quads, holds into flexion and extension stretches       9/4 Scifit bike rocking back and forth x 5 mins Standing heel raises 2x10  Grade I-II sup/inf patellar mobs Pt received R knee flexion and extension PROM in supine. Pt received R knee gentle manual extension stretch in supine.   Pt  unable to perform supine SLR  Supine heel slide with strap x10 Longsitting gastroc stretch with strap 3x20-30 sec LAQ 2x10 Seated heel slides 2x10      PATIENT EDUCATION:  Education  details:  post op protocol restrictions and expectations, sx response and management, HEP, dx, prognosis, gait, and POC.  PT answered pt's questions.  Person educated: Patient Education method: Explanation, Demonstration, Tactile cues, Verbal cues, and Handouts Education comprehension: verbalized understanding, returned demonstration, verbal cues required, and tactile cues required  HOME EXERCISE PROGRAM: Exercises - Supine Quadricep Sets  - 2 x daily - 7 x weekly - 2 sets - 10 reps - 5 seconds hold - ANKLE PUMPS  - 7 x weekly - Seated Passive Knee Extension  - 2-3 x daily - 7 x weekly - 2 reps - 2 minutes hold -Sitting with knee flexed for 3-5 mins comfortably at various times for 6-7 times/day  Access Code: [email or web address] URL: [email or web address] Date: 12/28/2023 Prepared by: Mose Minerva  Exercises - Supine Gluteal Sets  - 2 x daily - 7 x weekly - 2 sets - 10 reps - 5 seconds hold - Supine Heel Slide  - 2 - 3 x daily - 7 x weekly - 1 sets - 10 reps  Updated HEP: - Supine Short Arc Quad  - 1 x daily - 7 x weekly - 2 sets - 10 reps - Supine Active Straight Leg Raise  - 2 x daily - 7 x weekly - 1 sets - 5 reps - Supine Heel Slide with Strap  - 2 x daily - 7 x weekly - 2 sets - 10 reps  ASSESSMENT:  CLINICAL IMPRESSION:   Patient continues to make progress. She had pain and burning today upon entry but was able to complete all exercises. We reviewed cane training today. The patient used a step through pattern until she fatigued. Once she fatigued she used a step to program. She has also used a crutch in the past. She was advised to use which ever she feels safest with including the walker. We will continue to progress as tolerated. Her total arc was measured at 0-107 today. We also worked on standing weight shifts for gait training.    OBJECTIVE IMPAIRMENTS: Abnormal gait, decreased activity tolerance, decreased endurance, decreased mobility, difficulty walking, decreased  ROM, decreased strength, hypomobility, increased edema, impaired flexibility, and pain.   ACTIVITY LIMITATIONS: bending, standing, squatting, stairs, transfers, bed mobility, dressing, and locomotion level  PARTICIPATION LIMITATIONS: meal prep, cleaning, laundry, driving, shopping, and community activity  PERSONAL FACTORS: 1-2 comorbidities: L knee arthritis, A-fib are also affecting patient's functional outcome.   REHAB POTENTIAL: Good  CLINICAL DECISION MAKING: Stable/uncomplicated  EVALUATION COMPLEXITY: Low   GOALS:   SHORT TERM GOALS:  Pt will be independent and compliant with HEP for improved pain, ROM, strength, and function.  Baseline: Goal status: MET 01/15/24 Target date:     2.  Pt will demo a good quad set and be able to perform a supine SLR independently for improved strength and stability with gait. Baseline:  Goal status: ONGOING 01/15/24 Target date:  01/25/24   3.  Pt will demo improved R knee AROM to 0 - 95 deg for improved mobility, stiffness, and gait.   Baseline:  Goal status: ONGOING 01/15/24 Target date:  01/25/24  4.  Pt will demo improved quality of gait and ambulate with a SPC with good stability.  Baseline:  Goal status: ONGOING  01/15/24 Target date:  01/25/24  5.  Pt will able to perform her normal daily transfers with no > than minimal difficulty.   Baseline:  Goal status: ONGOING 01/15/24 Target date: 02/22/2024    LONG TERM GOALS: Target date: 03/07/24  Pt will demo improved R knee AROM to 0 - 115 deg for improved mobility and stiffness and for improved performance of stairs.  Baseline:  Goal status: INITIAL  2.  Pt will ambulate with a normalized heel to toe gait without limping.  Baseline:  Goal status: INITIAL  3.  Pt will ambulate community distance without an AD without significant pain and difficulty.  Baseline:  Goal status: INITIAL  4.  Pt will be able to perform household chores without significant pain and difficulty.    Baseline:  Goal status: INITIAL  5.  Pt will be able to perform 4-5 steps with a reciprocal gait with the rail for improved performance of stairs.    Baseline:  Goal status:  INITIAL  6.  Pt will demo improved strength to 4+/5 in R hip abd, 4+ to 5/5 in Hip flexion, and 5/5 in knee ext and flexion for improved performance of daily functional mobility and to assist with returning to recreational activities.  Baseline:  Goal status: INITIAL   PLAN:  PT FREQUENCY: 2-3x/wk x 3 weeks and 2x/wk afterwards  PT DURATION: 10 weeks  PLANNED INTERVENTIONS: 97164- PT Re-evaluation, 97750- Physical Performance Testing, 97110-Therapeutic exercises, 97530- Therapeutic activity, 97112- Neuromuscular re-education, 97535- Self Care, [id number]- Manual therapy, [phone]- Gait training, [phone]- Aquatic Therapy, Patient/Family education, Balance training, Stair training, Taping, Joint mobilization, Spinal mobilization, Scar mobilization, DME instructions, Cryotherapy, and Moist heat  PLAN FOR NEXT SESSION: Cont per TKA protocol.  Review and perform HEP.  Knee ROM.  Gait training. Manual as desired/tolerated    Alm Don PT DPT 01/18/24 12:57 PM

## 2024-01-20 ENCOUNTER — Ambulatory Visit (HOSPITAL_BASED_OUTPATIENT_CLINIC_OR_DEPARTMENT_OTHER): Admitting: Physical Therapy

## 2024-01-20 ENCOUNTER — Encounter (HOSPITAL_BASED_OUTPATIENT_CLINIC_OR_DEPARTMENT_OTHER): Payer: Self-pay | Admitting: Physical Therapy

## 2024-01-20 DIAGNOSIS — M25561 Pain in right knee: Secondary | ICD-10-CM

## 2024-01-20 DIAGNOSIS — M6281 Muscle weakness (generalized): Secondary | ICD-10-CM | POA: Diagnosis not present

## 2024-01-20 DIAGNOSIS — M25661 Stiffness of right knee, not elsewhere classified: Secondary | ICD-10-CM

## 2024-01-20 DIAGNOSIS — R262 Difficulty in walking, not elsewhere classified: Secondary | ICD-10-CM

## 2024-01-20 NOTE — Therapy (Signed)
 OUTPATIENT PHYSICAL THERAPY LOWER EXTREMITY TREATMENT   Patient Name: Dawn Haley MRN: [id number] DOB:04/07/1956, 68 y.o., female Today's Date: 01/20/2024  END OF SESSION:  PT End of Session - 01/20/24 1639     Visit Number 10    Number of Visits 22    Date for PT Re-Evaluation 03/07/24    Authorization Type BCBS MCR progress note at visit 10    PT Start Time 1145    PT Stop Time 1229    PT Time Calculation (min) 44 min    Activity Tolerance Patient tolerated treatment well    Behavior During Therapy WFL for tasks assessed/performed                  Past Medical History:  Diagnosis Date   Anxiety    Arthritis    Diverticulitis    Dyspnea    walking up hill   Dysrhythmia    PAF   Hypertension    Meniere disease of right ear    Pneumonia    Pre-diabetes    Past Surgical History:  Procedure Laterality Date   ABDOMINAL HYSTERECTOMY     APPENDECTOMY     COLON SURGERY  2022   Colectomy for colovaginal fistula & diverticulitis   CYST EXCISION Right 11/02/2015   Procedure: RIGHT INDEX EXCISION MASS ;  Surgeon: Franky Curia, MD;  Location: [facility];  Service: Orthopedics;  Laterality: Right;   CYSTOSCOPY Bilateral 11/09/2019   Procedure: CYSTOSCOPY WITH BILATERAL FIREFLY  INJECTION;  Surgeon: Devere Lonni Righter, MD;  Location: [facility];  Service: Urology;  Laterality: Bilateral;   DIAGNOSTIC LAPAROSCOPY     GYN   DILATION AND CURETTAGE OF UTERUS     TONSILLECTOMY     TOTAL KNEE ARTHROPLASTY Right 12/25/2023   Procedure: ARTHROPLASTY, KNEE, TOTAL;  Surgeon: Kay Kemps, MD;  Location: [facility];  Service: Orthopedics;  Laterality: Right;   URETHRAL DILATION     WISDOM TOOTH EXTRACTION     Patient Active Problem List   Diagnosis Date Noted   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   Paroxysmal atrial fibrillation (HCC) 02/17/2023   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       REFERRING PROVIDER: Kay Kemps, MD   REFERRING DIAG: M17.11 (ICD-10-CM) - Unilateral primary osteoarthritis, right knee  S/p R TKA   THERAPY DIAG:  Right knee pain, unspecified chronicity  Stiffness of right knee, not elsewhere classified  Muscle weakness (generalized)  Difficulty in walking, not elsewhere classified  Rationale for Evaluation and Treatment: Rehabilitation  ONSET DATE: DOS  12/25/23  SUBJECTIVE:   SUBJECTIVE STATEMENT:  The patient reports her knee felt very good after the last visit but got stiff once she got home.  PERTINENT HISTORY: R TKA  12/25/23 Arthritis including L knee, A-fib, anxiety, HTN   Pt reports having a fx in R hip 20 years ago.  PAIN:  NPRS:  6-7/10  Location:  R knee Type of pain: hot like its burning, incision hurts to touch, tight Trying to move it makes it worse, ice helps   PRECAUTIONS: Other: per surgical protocol for TKA    WEIGHT BEARING RESTRICTIONS: none indicated  FALLS:  Has patient fallen in last 6  months? No  LIVING ENVIRONMENT: Lives with: lives with their spouse Lives in: 1 story home Stairs: small step to enter/exit home without rail Has following equipment at home: Single point cane, Walker - 2 wheeled, and Crutches, bedside toilet, shower stool  OCCUPATION: Pt is retired.   PLOF: Independent.  Pt ambulating without an AD.   PATIENT GOALS:  Pt wants to be able to do everything.  Be able to garden.  She wants to be able to get up and down with ease and play with her grandson.     NEXT MD VISIT: 01/07/2024  OBJECTIVE:  Note: Objective measures were completed at Evaluation unless otherwise noted.  DIAGNOSTIC FINDINGS: Pt is post  op.   OBSERVATION:  Pt wearing bilat compression socks.  Aquacel bandage has been removed.  Incision intact and dry.  No signs of infection.     LOWER EXTREMITY ROM:   ROM Right eval Right 8/29 Right 01/15/24 09/08 Right  09/10  Hip flexion       Hip extension       Hip abduction       Hip adduction       Hip internal rotation       Hip external rotation       Knee flexion 60 deg PROM 98 deg PROM Seated AROM 100* 107 Passive  110  Knee extension 5/4  AROM/PROM 4 deg AROM Seated LAQ 19*, supine quad set with heel prop 6*  -3  Ankle dorsiflexion       Ankle plantarflexion       Ankle inversion       Ankle eversion        (Blank rows = not tested)   GAIT: Comments: slow gait, step to pattern with FWW.  Decreased Wb'ing through R LE and increased Wb'ing through UE's.                                                                                                                                TREATMENT:   9/10 Manual:  PROM into extension with distraction  Trigger point release to gastroc Review of self mobilization  Extension ROM with distraction  Trigger point release to posterior knee  Trigger point release to the quad   There-ex:  Review of HEP  Review of quad sets and their benefit   SAQ 3x10  LAQ 2x10  SLR 3x10   Standing weight shift.  9/8  Manual: Nu-step for range:  4 min L5   Manual: Trigger point release to gastroc Review of self mobilization  Extension ROM with distraction  Trigger point release to posterior knee  Trigger point release to the quad   There-ex:  SLR 2x10  SAQ 3x10 2#   Gait training: reviewed use of a cane using a gait belt for safety. Used a step through pattern until she fatigued.      01/15/24  Scifit bike seat 10 x8 minutes  partial rotations for ROM and w/u  ROM and goals  Rolling pin to R quad above level of incision pressure as tolerated   SLRs 2x10 R  Bridges 2x5  SAQs 2# 12x3 second holds HS stretches R LE  3x30 seconds  Shuttle BLE press 50# x12 eccentric lower for quads, holds into flexion and extension stretches       9/4 Scifit bike rocking back and forth x 5 mins Standing heel raises 2x10  Grade I-II sup/inf patellar mobs Pt received R knee flexion and extension PROM in supine. Pt received R knee gentle manual extension stretch in supine.   Pt unable to perform supine SLR  Supine heel slide with strap x10 Longsitting gastroc stretch with strap 3x20-30 sec LAQ 2x10 Seated heel slides 2x10      PATIENT EDUCATION:  Education details:  post op protocol restrictions and expectations, sx response and management, HEP, dx, prognosis, gait, and POC.  PT answered pt's questions.  Person educated: Patient Education method: Explanation, Demonstration, Tactile cues, Verbal cues, and Handouts Education comprehension: verbalized understanding, returned demonstration, verbal cues required, and tactile cues required  HOME EXERCISE PROGRAM: Exercises - Supine Quadricep Sets  - 2 x daily - 7 x weekly - 2 sets - 10 reps - 5 seconds hold - ANKLE PUMPS  - 7 x weekly - Seated Passive Knee Extension  - 2-3 x daily - 7 x weekly - 2 reps - 2 minutes hold -Sitting with knee flexed for 3-5 mins comfortably at various times for 6-7 times/day  Access Code: [email or web address] URL: [email or web address] Date: 12/28/2023 Prepared by: Mose Minerva  Exercises - Supine Gluteal Sets  - 2 x daily - 7 x weekly - 2 sets - 10 reps - 5 seconds hold - Supine Heel Slide  - 2 - 3 x daily - 7 x weekly - 1 sets - 10 reps  Updated HEP: - Supine Short Arc Quad  - 1 x daily - 7 x weekly - 2 sets - 10 reps - Supine Active Straight Leg Raise  - 2 x daily - 7 x weekly - 1 sets - 5 reps - Supine Heel Slide with Strap  - 2 x daily - 7 x weekly - 2 sets - 10 reps  ASSESSMENT:  CLINICAL IMPRESSION:  Therapy reviewed the patients HEP today. She had some older exercises which we removed. We also were able to add  some exercises today. She had pain at the end today with weight shifting. Therapy will continue to advance as tolerated. We will continue to work on gait training and hopefully stair training over the next few weeks. See below for goal specific progress.   OBJECTIVE IMPAIRMENTS: Abnormal gait, decreased activity tolerance, decreased endurance, decreased mobility, difficulty walking, decreased ROM, decreased strength, hypomobility, increased edema, impaired flexibility, and pain.   ACTIVITY LIMITATIONS: bending, standing, squatting, stairs, transfers, bed mobility, dressing, and locomotion level  PARTICIPATION LIMITATIONS: meal prep, cleaning, laundry, driving, shopping, and community activity  PERSONAL FACTORS: 1-2 comorbidities: L knee arthritis, A-fib are also affecting patient's functional outcome.   REHAB POTENTIAL: Good  CLINICAL DECISION MAKING: Stable/uncomplicated  EVALUATION COMPLEXITY: Low   GOALS:   SHORT TERM GOALS:  Pt will be independent and compliant with HEP for improved pain, ROM, strength, and function.  Baseline: Goal status: MET 01/15/24 Target date:     2.  Pt will demo a good quad set and be able to perform a supine SLR independently for improved strength and stability with gait.  Baseline:  Goal status: achieved 9/10 Target date:  01/25/24   3.  Pt will demo improved R knee AROM to 0 - 125deg for improved mobility, stiffness, and gait.   Baseline:  Goal status: archived and updated to 125  9/10 Target date:  01/25/24  4.  Pt will demo improved quality of gait and ambulate with a SPC with good stability.  Baseline:  Goal status: still using walker 9/10 Target date:  01/25/24  5.  Pt will able to perform her normal daily transfers with no > than minimal difficulty.   Baseline:  Goal status: mild difficulty 9/10  Target date: 02/22/2024    LONG TERM GOALS: Target date: 03/07/24  Pt will demo improved R knee AROM to 0 - 115 deg for improved mobility and  stiffness and for improved performance of stairs.  Baseline:  Goal status: INITIAL  2.  Pt will ambulate with a normalized heel to toe gait without limping.  Baseline:  Goal status: INITIAL  3.  Pt will ambulate community distance without an AD without significant pain and difficulty.  Baseline:  Goal status: INITIAL  4.  Pt will be able to perform household chores without significant pain and difficulty.   Baseline:  Goal status: INITIAL  5.  Pt will be able to perform 4-5 steps with a reciprocal gait with the rail for improved performance of stairs.    Baseline:  Goal status:  INITIAL  6.  Pt will demo improved strength to 4+/5 in R hip abd, 4+ to 5/5 in Hip flexion, and 5/5 in knee ext and flexion for improved performance of daily functional mobility and to assist with returning to recreational activities.  Baseline:  Goal status: INITIAL   PLAN:  PT FREQUENCY: 2-3x/wk x 3 weeks and 2x/wk afterwards  PT DURATION: 10 weeks  PLANNED INTERVENTIONS: 97164- PT Re-evaluation, 97750- Physical Performance Testing, 97110-Therapeutic exercises, 97530- Therapeutic activity, 97112- Neuromuscular re-education, 97535- Self Care, 02859- Manual therapy, [phone]- Gait training, [phone]- Aquatic Therapy, Patient/Family education, Balance training, Stair training, Taping, Joint mobilization, Spinal mobilization, Scar mobilization, DME instructions, Cryotherapy, and Moist heat  PLAN FOR NEXT SESSION: Cont per TKA protocol.  Review and perform HEP.  Knee ROM.  Gait training. Manual as desired/tolerated    Alm Don PT DPT 01/20/24 4:43 PM

## 2024-01-22 ENCOUNTER — Ambulatory Visit (HOSPITAL_BASED_OUTPATIENT_CLINIC_OR_DEPARTMENT_OTHER): Admitting: Physical Therapy

## 2024-01-22 ENCOUNTER — Encounter (HOSPITAL_BASED_OUTPATIENT_CLINIC_OR_DEPARTMENT_OTHER): Payer: Self-pay | Admitting: Physical Therapy

## 2024-01-22 DIAGNOSIS — M25561 Pain in right knee: Secondary | ICD-10-CM

## 2024-01-22 DIAGNOSIS — R262 Difficulty in walking, not elsewhere classified: Secondary | ICD-10-CM | POA: Diagnosis not present

## 2024-01-22 DIAGNOSIS — M25661 Stiffness of right knee, not elsewhere classified: Secondary | ICD-10-CM | POA: Diagnosis not present

## 2024-01-22 DIAGNOSIS — M6281 Muscle weakness (generalized): Secondary | ICD-10-CM

## 2024-01-22 NOTE — Therapy (Signed)
 OUTPATIENT PHYSICAL THERAPY LOWER EXTREMITY TREATMENT   Patient Name: Dawn Haley MRN: [id number] DOB:1956/01/17, 68 y.o., female Today's Date: 01/22/2024  END OF SESSION:  PT End of Session - 01/22/24 1227     Visit Number 11    Number of Visits 22    Date for PT Re-Evaluation 03/07/24    Authorization Type BCBS MCR progress note at visit 20    Progress Note Due on Visit 20    PT Start Time 1145    PT Stop Time 1225    PT Time Calculation (min) 40 min    Activity Tolerance Patient tolerated treatment well    Behavior During Therapy WFL for tasks assessed/performed                   Past Medical History:  Diagnosis Date   Anxiety    Arthritis    Diverticulitis    Dyspnea    walking up hill   Dysrhythmia    PAF   Hypertension    Meniere disease of right ear    Pneumonia    Pre-diabetes    Past Surgical History:  Procedure Laterality Date   ABDOMINAL HYSTERECTOMY     APPENDECTOMY     COLON SURGERY  2022   Colectomy for colovaginal fistula & diverticulitis   CYST EXCISION Right 11/02/2015   Procedure: RIGHT INDEX EXCISION MASS ;  Surgeon: Franky Curia, MD;  Location: [facility];  Service: Orthopedics;  Laterality: Right;   CYSTOSCOPY Bilateral 11/09/2019   Procedure: CYSTOSCOPY WITH BILATERAL FIREFLY  INJECTION;  Surgeon: Devere Lonni Righter, MD;  Location: [facility];  Service: Urology;  Laterality: Bilateral;   DIAGNOSTIC LAPAROSCOPY     GYN   DILATION AND CURETTAGE OF UTERUS     TONSILLECTOMY     TOTAL KNEE ARTHROPLASTY Right 12/25/2023   Procedure: ARTHROPLASTY, KNEE, TOTAL;  Surgeon: Kay Kemps, MD;  Location: [facility];  Service: Orthopedics;  Laterality: Right;   URETHRAL DILATION     WISDOM TOOTH EXTRACTION     Patient Active Problem List   Diagnosis Date Noted   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   Paroxysmal atrial fibrillation (HCC) 02/17/2023   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    Hypocalcemia 10/27/2019   Sigmoid diverticulitis 10/26/2019   Chest pain 06/06/2015   GERD (gastroesophageal reflux disease) 06/06/2015   Costochondritis 06/06/2015     REFERRING PROVIDER: Kay Kemps, MD   REFERRING DIAG: M17.11 (ICD-10-CM) - Unilateral primary osteoarthritis, right knee  S/p R TKA   THERAPY DIAG:  Right knee pain, unspecified chronicity  Stiffness of right knee, not elsewhere classified  Muscle weakness (generalized)  Difficulty in walking, not elsewhere classified  Rationale for Evaluation and Treatment: Rehabilitation  ONSET DATE: DOS  12/25/23  SUBJECTIVE:   SUBJECTIVE STATEMENT:  Knee is better, I started putting some vitamin E oil on the scar. Have been sleeping better at night since doing that.   PERTINENT HISTORY: R TKA  12/25/23 Arthritis including L knee, A-fib, anxiety, HTN   Pt reports having a fx in R hip 20 years ago.  PAIN:  NPRS:  2-3/10  Location:  R knee Type of pain: tight and hot  Nothing makes it worse, moving knee makes it better   PRECAUTIONS: Other: per surgical protocol for TKA    WEIGHT BEARING RESTRICTIONS: none indicated  FALLS:  Has patient fallen in last 6 months? No  LIVING ENVIRONMENT: Lives with: lives with their spouse Lives  in: 1 story home Stairs: small step to enter/exit home without rail Has following equipment at home: Single point cane, Walker - 2 wheeled, and Crutches, bedside toilet, shower stool  OCCUPATION: Pt is retired.   PLOF: Independent.  Pt ambulating without an AD.   PATIENT GOALS:  Pt wants to be able to do everything.  Be able to garden.  She wants to be able to get up and down with ease and play with her grandson.     NEXT MD VISIT: 01/07/2024  OBJECTIVE:  Note: Objective measures were completed at Evaluation unless otherwise noted.  DIAGNOSTIC FINDINGS: Pt is post op.   OBSERVATION:  Pt wearing bilat compression socks.  Aquacel bandage has been removed.  Incision intact and  dry.  No signs of infection.     LOWER EXTREMITY ROM:   ROM Right eval Right 8/29 Right 01/15/24 09/08 Right  09/10  Hip flexion       Hip extension       Hip abduction       Hip adduction       Hip internal rotation       Hip external rotation       Knee flexion 60 deg PROM 98 deg PROM Seated AROM 100* 107 Passive  110  Knee extension 5/4  AROM/PROM 4 deg AROM Seated LAQ 19*, supine quad set with heel prop 6*    Ankle dorsiflexion       Ankle plantarflexion       Ankle inversion       Ankle eversion        (Blank rows = not tested)   GAIT: Comments: slow gait, step to pattern with FWW.  Decreased Wb'ing through R LE and increased Wb'ing through UE's.                                                                                                                                TREATMENT:    01/22/24  Scifit bike x8 minutes seat 10 x4 minutes full rotations, then seat 9 x4 minutes still able to do full rotations  Shuttle BLE press 75# x15 holds into extension and flexion Shuttle single leg press 37# x10 B  LAQs 3# 2x12  SAQs 3# 2x12 Bridges x10 with R knee in max tolerated flexion Gait training approximately 216ft SPC close S  Tandem stance solid surface 3x30 seconds Tandem walks along rail 2 laps down and back Tandem stance with head turns x2     9/10 Manual:  PROM into extension with distraction  Trigger point release to gastroc Review of self mobilization  Extension ROM with distraction  Trigger point release to posterior knee  Trigger point release to the quad   There-ex:  Review of HEP  Review of quad sets and their benefit   SAQ 3x10  LAQ 2x10  SLR 3x10   Standing weight shift.  9/8  Manual: Nu-step for range:  4 min L5   Manual: Trigger point release to gastroc Review of self mobilization  Extension ROM with distraction  Trigger point release to posterior knee  Trigger point release to the quad   There-ex:  SLR 2x10  SAQ 3x10 2#    Gait training: reviewed use of a cane using a gait belt for safety. Used a step through pattern until she fatigued.      PATIENT EDUCATION:  Education details:  post op protocol restrictions and expectations, sx response and management, HEP, dx, prognosis, gait, and POC.  PT answered pt's questions.  Person educated: Patient Education method: Explanation, Demonstration, Tactile cues, Verbal cues, and Handouts Education comprehension: verbalized understanding, returned demonstration, verbal cues required, and tactile cues required  HOME EXERCISE PROGRAM: Exercises - Supine Quadricep Sets  - 2 x daily - 7 x weekly - 2 sets - 10 reps - 5 seconds hold - ANKLE PUMPS  - 7 x weekly - Seated Passive Knee Extension  - 2-3 x daily - 7 x weekly - 2 reps - 2 minutes hold -Sitting with knee flexed for 3-5 mins comfortably at various times for 6-7 times/day  Access Code: [email or web address] URL: [email or web address] Date: 12/28/2023 Prepared by: Mose Minerva  Exercises - Supine Gluteal Sets  - 2 x daily - 7 x weekly - 2 sets - 10 reps - 5 seconds hold - Supine Heel Slide  - 2 - 3 x daily - 7 x weekly - 1 sets - 10 reps  Updated HEP: - Supine Short Arc Quad  - 1 x daily - 7 x weekly - 2 sets - 10 reps - Supine Active Straight Leg Raise  - 2 x daily - 7 x weekly - 1 sets - 5 reps - Supine Heel Slide with Strap  - 2 x daily - 7 x weekly - 2 sets - 10 reps  ASSESSMENT:  CLINICAL IMPRESSION:  Arrives today, her knee has started to feel a lot better, happy she has been able to sleep better as well. Continued working on ROM and especially quad strengthening this visit today. She has made good progress since last time this therapist saw her on 01/15/24, anticipate she will continue to improve without complication moving forward.   OBJECTIVE IMPAIRMENTS: Abnormal gait, decreased activity tolerance, decreased endurance, decreased mobility, difficulty walking, decreased ROM, decreased strength,  hypomobility, increased edema, impaired flexibility, and pain.   ACTIVITY LIMITATIONS: bending, standing, squatting, stairs, transfers, bed mobility, dressing, and locomotion level  PARTICIPATION LIMITATIONS: meal prep, cleaning, laundry, driving, shopping, and community activity  PERSONAL FACTORS: 1-2 comorbidities: L knee arthritis, A-fib are also affecting patient's functional outcome.   REHAB POTENTIAL: Good  CLINICAL DECISION MAKING: Stable/uncomplicated  EVALUATION COMPLEXITY: Low   GOALS:   SHORT TERM GOALS:  Pt will be independent and compliant with HEP for improved pain, ROM, strength, and function.  Baseline: Goal status: MET 01/15/24 Target date:     2.  Pt will demo a good quad set and be able to perform a supine SLR independently for improved strength and stability with gait. Baseline:  Goal status: ONGOING 01/15/24 Target date:  01/25/24   3.  Pt will demo improved R knee AROM to 0 - 95 deg for improved mobility, stiffness, and gait.   Baseline:  Goal status: ONGOING 01/15/24 Target date:  01/25/24  4.  Pt will demo improved quality of gait and ambulate with a SPC with good stability.  Baseline:  Goal status: ONGOING  01/15/24 Target date:  01/25/24  5.  Pt will able to perform her normal daily transfers with no > than minimal difficulty.   Baseline:  Goal status: ONGOING 01/15/24 Target date: 02/22/2024    LONG TERM GOALS: Target date: 03/07/24  Pt will demo improved R knee AROM to 0 - 115 deg for improved mobility and stiffness and for improved performance of stairs.  Baseline:  Goal status: INITIAL  2.  Pt will ambulate with a normalized heel to toe gait without limping.  Baseline:  Goal status: INITIAL  3.  Pt will ambulate community distance without an AD without significant pain and difficulty.  Baseline:  Goal status: INITIAL  4.  Pt will be able to perform household chores without significant pain and difficulty.   Baseline:  Goal status:  INITIAL  5.  Pt will be able to perform 4-5 steps with a reciprocal gait with the rail for improved performance of stairs.    Baseline:  Goal status:  INITIAL  6.  Pt will demo improved strength to 4+/5 in R hip abd, 4+ to 5/5 in Hip flexion, and 5/5 in knee ext and flexion for improved performance of daily functional mobility and to assist with returning to recreational activities.  Baseline:  Goal status: INITIAL   PLAN:  PT FREQUENCY: 2-3x/wk x 3 weeks and 2x/wk afterwards  PT DURATION: 10 weeks  PLANNED INTERVENTIONS: 97164- PT Re-evaluation, 97750- Physical Performance Testing, 97110-Therapeutic exercises, 97530- Therapeutic activity, 97112- Neuromuscular re-education, 97535- Self Care, 02859- Manual therapy, [phone]- Gait training, [phone]- Aquatic Therapy, Patient/Family education, Balance training, Stair training, Taping, Joint mobilization, Spinal mobilization, Scar mobilization, DME instructions, Cryotherapy, and Moist heat  PLAN FOR NEXT SESSION: Cont per TKA protocol.  Review and perform HEP, progressions PRN.  Knee ROM.  Gait training. Manual as desired/tolerated. Cleared her to use SPC in house but RW in community for now 01/22/24  Josette Rough, PT, DPT 01/22/24 12:29 PM

## 2024-01-25 ENCOUNTER — Ambulatory Visit (HOSPITAL_BASED_OUTPATIENT_CLINIC_OR_DEPARTMENT_OTHER): Admitting: Physical Therapy

## 2024-01-25 ENCOUNTER — Encounter (HOSPITAL_BASED_OUTPATIENT_CLINIC_OR_DEPARTMENT_OTHER): Payer: Self-pay | Admitting: Physical Therapy

## 2024-01-25 DIAGNOSIS — M25561 Pain in right knee: Secondary | ICD-10-CM

## 2024-01-25 DIAGNOSIS — M25661 Stiffness of right knee, not elsewhere classified: Secondary | ICD-10-CM

## 2024-01-25 DIAGNOSIS — R262 Difficulty in walking, not elsewhere classified: Secondary | ICD-10-CM | POA: Diagnosis not present

## 2024-01-25 DIAGNOSIS — M6281 Muscle weakness (generalized): Secondary | ICD-10-CM | POA: Diagnosis not present

## 2024-01-25 NOTE — Therapy (Signed)
 OUTPATIENT PHYSICAL THERAPY LOWER EXTREMITY TREATMENT   Patient Name: CASS EDINGER MRN: [id number] DOB:[birth year], 68 y.o., female Today's Date: 01/25/2024  END OF SESSION:  PT End of Session - 01/25/24 1143     Visit Number 12    Number of Visits 22    Date for PT Re-Evaluation 03/07/24    Authorization Type [employer]BS MCR progress note at visit 20    PT Start Time 1145    PT Stop Time 1228    PT Time Calculation (min) 43 min    Activity Tolerance Patient tolerated treatment well    Behavior During Therapy WFL for tasks assessed/performed                   Past Medical History:  Diagnosis Date   Anxiety    Arthritis    Diverticulitis    Dyspnea    walking up hill   Dysrhythmia    PAF   Hypertension    Meniere disease of right ear    Pneumonia    Pre-diabetes    Past Surgical History:  Procedure Laterality Date   ABDOMINAL HYSTERECTOMY     APPENDECTOMY     COLON SURGERY  2022   Colectomy for colovaginal fistula & diverticulitis   CYST EXCISION Right 11/02/2015   Procedure: RIGHT INDEX EXCISION MASS ;  Surgeon: Franky Curia, MD;  Location: [facility];  Service: Orthopedics;  Laterality: Right;   CYSTOSCOPY Bilateral 11/09/2019   Procedure: CYSTOSCOPY WITH BILATERAL FIREFLY  INJECTION;  Surgeon: Devere Lonni Righter, MD;  Location: [facility];  Service: Urology;  Laterality: Bilateral;   DIAGNOSTIC LAPAROSCOPY     GYN   DILATION AND CURETTAGE OF UTERUS     TONSILLECTOMY     TOTAL KNEE ARTHROPLASTY Right 12/25/2023   Procedure: ARTHROPLASTY, KNEE, TOTAL;  Surgeon: Kay Kemps, MD;  Location: [facility];  Service: Orthopedics;  Laterality: Right;   URETHRAL DILATION     WISDOM TOOTH EXTRACTION     Patient Active Problem List   Diagnosis Date Noted   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   Paroxysmal atrial fibrillation (HCC) 02/17/2023   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   Hypocalcemia 10/27/2019   Sigmoid  diverticulitis 10/26/2019   Chest pain 06/06/2015   GERD (gastroesophageal reflux disease) 06/06/2015   Costochondritis 06/06/2015     REFERRING PROVIDER: Kay Kemps, MD   REFERRING DIAG: M17.11 (ICD-10-CM) - Unilateral primary osteoarthritis, right knee  S/p R TKA   THERAPY DIAG:  Right knee pain, unspecified chronicity  Stiffness of right knee, not elsewhere classified  Muscle weakness (generalized)  Difficulty in walking, not elsewhere classified  Rationale for Evaluation and Treatment: Rehabilitation  ONSET DATE: DOS  12/25/23  SUBJECTIVE:   SUBJECTIVE STATEMENT:  Knee is better, I started putting some vitamin E oil on the scar. Have been sleeping better at night since doing that.   PERTINENT HISTORY: R TKA  12/25/23 Arthritis including L knee, A-fib, anxiety, HTN   Pt reports having a fx in R hip 20 years ago.  PAIN:  NPRS:  2-3/10  Location:  R knee Type of pain: tight and hot  Nothing makes it worse, moving knee makes it better   PRECAUTIONS: Other: per surgical protocol for TKA    WEIGHT BEARING RESTRICTIONS: none indicated  FALLS:  Has patient fallen in last 6 months? No  LIVING ENVIRONMENT: Lives with: lives with their spouse Lives in: 1 story home Stairs: small step to enter/exit  home without rail Has following equipment at home: Single point cane, Walker - 2 wheeled, and Crutches, bedside toilet, shower stool  OCCUPATION: Pt is retired.   PLOF: Independent.  Pt ambulating without an AD.   PATIENT GOALS:  Pt wants to be able to do everything.  Be able to garden.  She wants to be able to get up and down with ease and play with her grandson.     NEXT MD VISIT: 01/07/2024  OBJECTIVE:  Note: Objective measures were completed at Evaluation unless otherwise noted.  DIAGNOSTIC FINDINGS: Pt is post op.   OBSERVATION:  Pt wearing bilat compression socks.  Aquacel bandage has been removed.  Incision intact and dry.  No signs of infection.      LOWER EXTREMITY ROM:   ROM Right eval Right 8/29 Right 01/15/24 09/08 Right  09/10  Hip flexion       Hip extension       Hip abduction       Hip adduction       Hip internal rotation       Hip external rotation       Knee flexion 60 deg PROM 98 deg PROM Seated AROM 100* 107 Passive  110  Knee extension 5/4  AROM/PROM 4 deg AROM Seated LAQ 19*, supine quad set with heel prop 6*    Ankle dorsiflexion       Ankle plantarflexion       Ankle inversion       Ankle eversion        (Blank rows = not tested)   GAIT: Comments: slow gait, step to pattern with FWW.  Decreased Wb'ing through R LE and increased Wb'ing through UE's.                                                                                                                                TREATMENT:    9/15 Manual:  PROM into extension with distraction  Trigger point release to gastroc Review of self mobilization  Extension ROM with distraction  Trigger point release to posterior knee  Trigger point release to the quad   There-ex:  Review of HEP  Review of quad sets and their benefit   SAQ 3x10  LAQ 2x10  SLR 3x10   There-act: step up 3x10 2 inch  Lateral step up 3x10 2 inch     01/22/24  Scifit bike x8 minutes seat 10 x4 minutes full rotations, then seat 9 x4 minutes still able to do full rotations  Shuttle BLE press 75# x15 holds into extension and flexion Shuttle single leg press 37# x10 B  LAQs 3# 2x12  SAQs 3# 2x12 Bridges x10 with R knee in max tolerated flexion Gait training approximately 229ft SPC close S  Tandem stance solid surface 3x30 seconds Tandem walks along rail 2 laps down and back Tandem stance with head turns x2     9/10 Manual:  PROM into extension with distraction  Trigger point release to gastroc Review of self mobilization  Extension ROM with distraction  Trigger point release to posterior knee  Trigger point release to the quad   There-ex:  Review of HEP   Review of quad sets and their benefit   SAQ 3x10  LAQ 2x10  SLR 3x10   Standing weight shift.  9/8  Manual: Nu-step for range:  4 min L5   Manual: Trigger point release to gastroc Review of self mobilization  Extension ROM with distraction  Trigger point release to posterior knee  Trigger point release to the quad   There-ex:  SLR 2x10  SAQ 3x10 2#   Gait training: reviewed use of a cane using a gait belt for safety. Used a step through pattern until she fatigued.      PATIENT EDUCATION:  Education details:  post op protocol restrictions and expectations, sx response and management, HEP, dx, prognosis, gait, and POC.  PT answered pt's questions.  Person educated: Patient Education method: Explanation, Demonstration, Tactile cues, Verbal cues, and Handouts Education comprehension: verbalized understanding, returned demonstration, verbal cues required, and tactile cues required  HOME EXERCISE PROGRAM: Exercises - Supine Quadricep Sets  - 2 x daily - 7 x weekly - 2 sets - 10 reps - 5 seconds hold - ANKLE PUMPS  - 7 x weekly - Seated Passive Knee Extension  - 2-3 x daily - 7 x weekly - 2 reps - 2 minutes hold -Sitting with knee flexed for 3-5 mins comfortably at various times for 6-7 times/day  Access Code: [email or web address] URL: [email or web address] Date: 12/28/2023 Prepared by: Mose Minerva  Exercises - Supine Gluteal Sets  - 2 x daily - 7 x weekly - 2 sets - 10 reps - 5 seconds hold - Supine Heel Slide  - 2 - 3 x daily - 7 x weekly - 1 sets - 10 reps  Updated HEP: - Supine Short Arc Quad  - 1 x daily - 7 x weekly - 2 sets - 10 reps - Supine Active Straight Leg Raise  - 2 x daily - 7 x weekly - 1 sets - 5 reps - Supine Heel Slide with Strap  - 2 x daily - 7 x weekly - 2 sets - 10 reps  ASSESSMENT:  CLINICAL IMPRESSION:  The patient had baseline soreness today. She tolerated her exercises well though. Her total arc was measured at 0-117. We continue  to work on functional strengthening such as steps. We will continue to work on weaning her off the walker as tolerated.   OBJECTIVE IMPAIRMENTS: Abnormal gait, decreased activity tolerance, decreased endurance, decreased mobility, difficulty walking, decreased ROM, decreased strength, hypomobility, increased edema, impaired flexibility, and pain.   ACTIVITY LIMITATIONS: bending, standing, squatting, stairs, transfers, bed mobility, dressing, and locomotion level  PARTICIPATION LIMITATIONS: meal prep, cleaning, laundry, driving, shopping, and community activity  PERSONAL FACTORS: 1-2 comorbidities: L knee arthritis, A-fib are also affecting patient's functional outcome.   REHAB POTENTIAL: Good  CLINICAL DECISION MAKING: Stable/uncomplicated  EVALUATION COMPLEXITY: Low   GOALS:   SHORT TERM GOALS:  Pt will be independent and compliant with HEP for improved pain, ROM, strength, and function.  Baseline: Goal status: MET 01/15/24 Target date:     2.  Pt will demo a good quad set and be able to perform a supine SLR independently for improved strength and stability with gait. Baseline:  Goal status: ONGOING 01/15/24 Target date:  01/25/24   3.  Pt  will demo improved R knee AROM to 0 - 95 deg for improved mobility, stiffness, and gait.   Baseline:  Goal status: ONGOING 01/15/24 Target date:  01/25/24  4.  Pt will demo improved quality of gait and ambulate with a SPC with good stability.  Baseline:  Goal status: ONGOING  01/15/24 Target date:  01/25/24  5.  Pt will able to perform her normal daily transfers with no > than minimal difficulty.   Baseline:  Goal status: ONGOING 01/15/24 Target date: 02/22/2024    LONG TERM GOALS: Target date: 03/07/24  Pt will demo improved R knee AROM to 0 - 115 deg for improved mobility and stiffness and for improved performance of stairs.  Baseline:  Goal status: INITIAL  2.  Pt will ambulate with a normalized heel to toe gait without limping.   Baseline:  Goal status: INITIAL  3.  Pt will ambulate community distance without an AD without significant pain and difficulty.  Baseline:  Goal status: INITIAL  4.  Pt will be able to perform household chores without significant pain and difficulty.   Baseline:  Goal status: INITIAL  5.  Pt will be able to perform 4-5 steps with a reciprocal gait with the rail for improved performance of stairs.    Baseline:  Goal status:  INITIAL  6.  Pt will demo improved strength to 4+/5 in R hip abd, 4+ to 5/5 in Hip flexion, and 5/5 in knee ext and flexion for improved performance of daily functional mobility and to assist with returning to recreational activities.  Baseline:  Goal status: INITIAL   PLAN:  PT FREQUENCY: 2-3x/wk x 3 weeks and 2x/wk afterwards  PT DURATION: 10 weeks  PLANNED INTERVENTIONS: 97164- PT Re-evaluation, 97750- Physical Performance Testing, 97110-Therapeutic exercises, 97530- Therapeutic activity, 97112- Neuromuscular re-education, 97535- Self Care, 02859- Manual therapy, [phone]- Gait training, [phone]- Aquatic Therapy, Patient/Family education, Balance training, Stair training, Taping, Joint mobilization, Spinal mobilization, Scar mobilization, DME instructions, Cryotherapy, and Moist heat  PLAN FOR NEXT SESSION: Cont per TKA protocol.  Review and perform HEP, progressions PRN.  Knee ROM.  Gait training. Manual as desired/tolerated. Cleared her to use SPC in house but RW in community for now 01/22/24  Josette Rough, PT, DPT 01/25/24 11:48 AM

## 2024-01-27 ENCOUNTER — Ambulatory Visit (HOSPITAL_BASED_OUTPATIENT_CLINIC_OR_DEPARTMENT_OTHER): Admitting: Physical Therapy

## 2024-01-27 DIAGNOSIS — M6281 Muscle weakness (generalized): Secondary | ICD-10-CM

## 2024-01-27 DIAGNOSIS — M25661 Stiffness of right knee, not elsewhere classified: Secondary | ICD-10-CM | POA: Diagnosis not present

## 2024-01-27 DIAGNOSIS — M25561 Pain in right knee: Secondary | ICD-10-CM | POA: Diagnosis not present

## 2024-01-27 DIAGNOSIS — R262 Difficulty in walking, not elsewhere classified: Secondary | ICD-10-CM

## 2024-01-27 NOTE — Therapy (Signed)
 OUTPATIENT PHYSICAL THERAPY LOWER EXTREMITY TREATMENT   Patient Name: SCOTLYN MCCRANIE MRN: [id number] DOB:Apr 08, 1956, 68 y.o., female Today's Date: 01/28/2024  END OF SESSION:  PT End of Session - 01/27/24 1549     Visit Number 13    Number of Visits 22    Date for Recertification  03/07/24    Authorization Type BCBS MCR progress note at visit 20    PT Start Time 1543    PT Stop Time 1624    PT Time Calculation (min) 41 min    Activity Tolerance Patient tolerated treatment well    Behavior During Therapy WFL for tasks assessed/performed                   Past Medical History:  Diagnosis Date   Anxiety    Arthritis    Diverticulitis    Dyspnea    walking up hill   Dysrhythmia    PAF   Hypertension    Meniere disease of right ear    Pneumonia    Pre-diabetes    Past Surgical History:  Procedure Laterality Date   ABDOMINAL HYSTERECTOMY     APPENDECTOMY     COLON SURGERY  2022   Colectomy for colovaginal fistula & diverticulitis   CYST EXCISION Right 11/02/2015   Procedure: RIGHT INDEX EXCISION MASS ;  Surgeon: Franky Curia, MD;  Location: [facility];  Service: Orthopedics;  Laterality: Right;   CYSTOSCOPY Bilateral 11/09/2019   Procedure: CYSTOSCOPY WITH BILATERAL FIREFLY  INJECTION;  Surgeon: Devere Lonni Righter, MD;  Location: [facility];  Service: Urology;  Laterality: Bilateral;   DIAGNOSTIC LAPAROSCOPY     GYN   DILATION AND CURETTAGE OF UTERUS     TONSILLECTOMY     TOTAL KNEE ARTHROPLASTY Right 12/25/2023   Procedure: ARTHROPLASTY, KNEE, TOTAL;  Surgeon: Kay Kemps, MD;  Location: [facility];  Service: Orthopedics;  Laterality: Right;   URETHRAL DILATION     WISDOM TOOTH EXTRACTION     Patient Active Problem List   Diagnosis Date Noted   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   Paroxysmal atrial fibrillation (HCC) 02/17/2023   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   Hypocalcemia 10/27/2019   Sigmoid  diverticulitis 10/26/2019   Chest pain 06/06/2015   GERD (gastroesophageal reflux disease) 06/06/2015   Costochondritis 06/06/2015     REFERRING PROVIDER: Kay Kemps, MD   REFERRING DIAG: M17.11 (ICD-10-CM) - Unilateral primary osteoarthritis, right knee  S/p R TKA   THERAPY DIAG:  Right knee pain, unspecified chronicity  Stiffness of right knee, not elsewhere classified  Muscle weakness (generalized)  Difficulty in walking, not elsewhere classified  Rationale for Evaluation and Treatment: Rehabilitation  ONSET DATE: DOS  12/25/23  SUBJECTIVE:   SUBJECTIVE STATEMENT:  Pt couldn't sleep Friday night.  Pt stomped on a cockroach with R LE early Saturday AM.  She had pain a couple of hours later.  Pt reports it is feeling better though still hurting.     PERTINENT HISTORY: R TKA  12/25/23 Arthritis including L knee, A-fib, anxiety, HTN   Pt reports having a fx in R hip 20 years ago.  PAIN:  NPRS:  1-2/10  Location:  R knee Type of pain: tightness > than pain  Nothing makes it worse, moving knee makes it better   PRECAUTIONS: Other: per surgical protocol for TKA    WEIGHT BEARING RESTRICTIONS: none indicated  FALLS:  Has patient fallen in last 6 months? No  LIVING  ENVIRONMENT: Lives with: lives with their spouse Lives in: 1 story home Stairs: small step to enter/exit home without rail Has following equipment at home: Single point cane, Walker - 2 wheeled, and Crutches, bedside toilet, shower stool  OCCUPATION: Pt is retired.   PLOF: Independent.  Pt ambulating without an AD.   PATIENT GOALS:  Pt wants to be able to do everything.  Be able to garden.  She wants to be able to get up and down with ease and play with her grandson.     NEXT MD VISIT: 01/07/2024  OBJECTIVE:  Note: Objective measures were completed at Evaluation unless otherwise noted.  DIAGNOSTIC FINDINGS: Pt is post op.   OBSERVATION:  Pt wearing bilat compression socks.  Aquacel  bandage has been removed.  Incision intact and dry.  No signs of infection.     LOWER EXTREMITY ROM:   ROM Right eval Right 8/29 Right 01/15/24 09/08 Right  09/10  Hip flexion       Hip extension       Hip abduction       Hip adduction       Hip internal rotation       Hip external rotation       Knee flexion 60 deg PROM 98 deg PROM Seated AROM 100* 107 Passive  110  Knee extension 5/4  AROM/PROM 4 deg AROM Seated LAQ 19*, supine quad set with heel prop 6*    Ankle dorsiflexion       Ankle plantarflexion       Ankle inversion       Ankle eversion        (Blank rows = not tested)   GAIT: Comments: slow gait, step to pattern with FWW.  Decreased Wb'ing through R LE and increased Wb'ing through UE's.                                                                                                                                TREATMENT:    9/17 Scifit bike x 5 mins  Pt received R knee flexion and extension PROM in supine.  Pt received OP into extension. Supine heel slides with strap x 10 reps  R knee extension AROM/PROM: 2/0 deg R knee flexion ROM:   113 deg   Supine SLR 2x10 LAQ 2x10 Seated HS curl with YTB 2x10 TKE with RTB 2x10 Step ups with UE support 2 inch 2 x 10 reps Attempted 4 inch step up and pt performed 3 reps though stopped due to pain  9/15 Manual:  PROM into extension with distraction  Trigger point release to gastroc Review of self mobilization  Extension ROM with distraction  Trigger point release to posterior knee  Trigger point release to the quad   There-ex:  Review of HEP  Review of quad sets and their benefit   SAQ 3x10  LAQ 2x10  SLR 3x10   There-act: step up 3x10  2 inch  Lateral step up 3x10 2 inch     01/22/24  Scifit bike x8 minutes seat 10 x4 minutes full rotations, then seat 9 x4 minutes still able to do full rotations  Shuttle BLE press 75# x15 holds into extension and flexion Shuttle single leg press 37# x10 B  LAQs 3#  2x12  SAQs 3# 2x12 Bridges x10 with R knee in max tolerated flexion Gait training approximately 256ft SPC close S  Tandem stance solid surface 3x30 seconds Tandem walks along rail 2 laps down and back Tandem stance with head turns x2     9/10 Manual:  PROM into extension with distraction  Trigger point release to gastroc Review of self mobilization  Extension ROM with distraction  Trigger point release to posterior knee  Trigger point release to the quad   There-ex:  Review of HEP  Review of quad sets and their benefit   SAQ 3x10  LAQ 2x10  SLR 3x10   Standing weight shift.  9/8  Manual: Nu-step for range:  4 min L5   Manual: Trigger point release to gastroc Review of self mobilization  Extension ROM with distraction  Trigger point release to posterior knee  Trigger point release to the quad   There-ex:  SLR 2x10  SAQ 3x10 2#   Gait training: reviewed use of a cane using a gait belt for safety. Used a step through pattern until she fatigued.      PATIENT EDUCATION:  Education details:  post op protocol restrictions and expectations, ROM findings, sx response and management, HEP, dx, prognosis, gait, and POC.  PT answered pt's questions.  Person educated: Patient Education method: Explanation, Demonstration, Tactile cues, Verbal cues Education comprehension: verbalized understanding, returned demonstration, verbal cues required, and tactile cues required  HOME EXERCISE PROGRAM: Exercises - Supine Quadricep Sets  - 2 x daily - 7 x weekly - 2 sets - 10 reps - 5 seconds hold - ANKLE PUMPS  - 7 x weekly - Seated Passive Knee Extension  - 2-3 x daily - 7 x weekly - 2 reps - 2 minutes hold -Sitting with knee flexed for 3-5 mins comfortably at various times for 6-7 times/day  Access Code: [email or web address] URL: [email or web address] Date: 12/28/2023 Prepared by: Mose Minerva  Exercises - Supine Gluteal Sets  - 2 x daily - 7 x weekly - 2 sets - 10  reps - 5 seconds hold - Supine Heel Slide  - 2 - 3 x daily - 7 x weekly - 1 sets - 10 reps  Updated HEP: - Supine Short Arc Quad  - 1 x daily - 7 x weekly - 2 sets - 10 reps - Supine Active Straight Leg Raise  - 2 x daily - 7 x weekly - 1 sets - 5 reps - Supine Heel Slide with Strap  - 2 x daily - 7 x weekly - 2 sets - 10 reps  ASSESSMENT:  CLINICAL IMPRESSION:  Pt is 4 weeks and 5 days s/p R TKA.  Pt is progressing with R knee ROM.  She lacked 2 deg of extension actively though had 0 deg passively.  She tolerated PROM well.  Pt performed exercises per protocol well with cuing and instruction in correct form.  Pt attempted step ups on a 4 inch step up though stopped after 3 reps due to pain.  PT decreased height of step ups to a 2 inch step and she performed well.  PT assessed cane height and cane was  at appropriate height.  PT educated pt in correct height.  She responded well to treatment reporting increased pain to 3/10 after treatment.  Pt states she has more stiffness than pain after treatment.    OBJECTIVE IMPAIRMENTS: Abnormal gait, decreased activity tolerance, decreased endurance, decreased mobility, difficulty walking, decreased ROM, decreased strength, hypomobility, increased edema, impaired flexibility, and pain.   ACTIVITY LIMITATIONS: bending, standing, squatting, stairs, transfers, bed mobility, dressing, and locomotion level  PARTICIPATION LIMITATIONS: meal prep, cleaning, laundry, driving, shopping, and community activity  PERSONAL FACTORS: 1-2 comorbidities: L knee arthritis, A-fib are also affecting patient's functional outcome.   REHAB POTENTIAL: Good  CLINICAL DECISION MAKING: Stable/uncomplicated  EVALUATION COMPLEXITY: Low   GOALS:   SHORT TERM GOALS:  Pt will be independent and compliant with HEP for improved pain, ROM, strength, and function.  Baseline: Goal status: MET 01/15/24 Target date:     2.  Pt will demo a good quad set and be able to perform a  supine SLR independently for improved strength and stability with gait. Baseline:  Goal status: ONGOING 01/15/24 Target date:  01/25/24   3.  Pt will demo improved R knee AROM to 0 - 95 deg for improved mobility, stiffness, and gait.   Baseline:  Goal status: ONGOING 01/15/24 Target date:  01/25/24  4.  Pt will demo improved quality of gait and ambulate with a SPC with good stability.  Baseline:  Goal status: ONGOING  01/15/24 Target date:  01/25/24  5.  Pt will able to perform her normal daily transfers with no > than minimal difficulty.   Baseline:  Goal status: ONGOING 01/15/24 Target date: 02/22/2024    LONG TERM GOALS: Target date: 03/07/24  Pt will demo improved R knee AROM to 0 - 115 deg for improved mobility and stiffness and for improved performance of stairs.  Baseline:  Goal status: INITIAL  2.  Pt will ambulate with a normalized heel to toe gait without limping.  Baseline:  Goal status: INITIAL  3.  Pt will ambulate community distance without an AD without significant pain and difficulty.  Baseline:  Goal status: INITIAL  4.  Pt will be able to perform household chores without significant pain and difficulty.   Baseline:  Goal status: INITIAL  5.  Pt will be able to perform 4-5 steps with a reciprocal gait with the rail for improved performance of stairs.    Baseline:  Goal status:  INITIAL  6.  Pt will demo improved strength to 4+/5 in R hip abd, 4+ to 5/5 in Hip flexion, and 5/5 in knee ext and flexion for improved performance of daily functional mobility and to assist with returning to recreational activities.  Baseline:  Goal status: INITIAL   PLAN:  PT FREQUENCY: 2-3x/wk x 3 weeks and 2x/wk afterwards  PT DURATION: 10 weeks  PLANNED INTERVENTIONS: 97164- PT Re-evaluation, 97750- Physical Performance Testing, 97110-Therapeutic exercises, 97530- Therapeutic activity, 97112- Neuromuscular re-education, 97535- Self Care, [id number]- Manual therapy, [phone]- Gait  training, [phone]- Aquatic Therapy, Patient/Family education, Balance training, Stair training, Taping, Joint mobilization, Spinal mobilization, Scar mobilization, DME instructions, Cryotherapy, and Moist heat  PLAN FOR NEXT SESSION: Cont per TKA protocol.  Review and perform HEP, progressions PRN.  Knee ROM.  Gait training. Manual as desired/tolerated.    Leigh Minerva III PT, DPT 01/28/24 9:37 PM

## 2024-01-28 ENCOUNTER — Encounter (HOSPITAL_BASED_OUTPATIENT_CLINIC_OR_DEPARTMENT_OTHER): Payer: Self-pay | Admitting: Physical Therapy

## 2024-01-29 ENCOUNTER — Ambulatory Visit (HOSPITAL_BASED_OUTPATIENT_CLINIC_OR_DEPARTMENT_OTHER): Admitting: Physical Therapy

## 2024-01-29 ENCOUNTER — Encounter (HOSPITAL_BASED_OUTPATIENT_CLINIC_OR_DEPARTMENT_OTHER): Payer: Self-pay | Admitting: Physical Therapy

## 2024-01-29 DIAGNOSIS — M6281 Muscle weakness (generalized): Secondary | ICD-10-CM | POA: Diagnosis not present

## 2024-01-29 DIAGNOSIS — R262 Difficulty in walking, not elsewhere classified: Secondary | ICD-10-CM | POA: Diagnosis not present

## 2024-01-29 DIAGNOSIS — M25561 Pain in right knee: Secondary | ICD-10-CM | POA: Diagnosis not present

## 2024-01-29 DIAGNOSIS — M25661 Stiffness of right knee, not elsewhere classified: Secondary | ICD-10-CM | POA: Diagnosis not present

## 2024-01-29 NOTE — Therapy (Signed)
 OUTPATIENT PHYSICAL THERAPY LOWER EXTREMITY TREATMENT   Patient Name: Dawn Haley MRN: [id number] DOB:[birth year], 68 y.o., female Today's Date: 01/29/2024  END OF SESSION:  PT End of Session - 01/29/24 1426     Visit Number 14    Number of Visits 22    Date for Recertification  03/07/24    Authorization Type BCBS MCR progress note at visit 20    PT Start Time 1145    PT Stop Time 1228    PT Time Calculation (min) 43 min    Activity Tolerance Patient tolerated treatment well    Behavior During Therapy WFL for tasks assessed/performed                    Past Medical History:  Diagnosis Date   Anxiety    Arthritis    Diverticulitis    Dyspnea    walking up hill   Dysrhythmia    PAF   Hypertension    Meniere disease of right ear    Pneumonia    Pre-diabetes    Past Surgical History:  Procedure Laterality Date   ABDOMINAL HYSTERECTOMY     APPENDECTOMY     COLON SURGERY  2022   Colectomy for colovaginal fistula & diverticulitis   CYST EXCISION Right 11/02/2015   Procedure: RIGHT INDEX EXCISION MASS ;  Surgeon: Franky Curia, MD;  Location: [facility];  Service: Orthopedics;  Laterality: Right;   CYSTOSCOPY Bilateral 11/09/2019   Procedure: CYSTOSCOPY WITH BILATERAL FIREFLY  INJECTION;  Surgeon: Devere Lonni Righter, MD;  Location: [facility];  Service: Urology;  Laterality: Bilateral;   DIAGNOSTIC LAPAROSCOPY     GYN   DILATION AND CURETTAGE OF UTERUS     TONSILLECTOMY     TOTAL KNEE ARTHROPLASTY Right 12/25/2023   Procedure: ARTHROPLASTY, KNEE, TOTAL;  Surgeon: Kay Kemps, MD;  Location: [facility];  Service: Orthopedics;  Laterality: Right;   URETHRAL DILATION     WISDOM TOOTH EXTRACTION     Patient Active Problem List   Diagnosis Date Noted   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   Paroxysmal atrial fibrillation (HCC) 02/17/2023   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   Hypocalcemia 10/27/2019    Sigmoid diverticulitis 10/26/2019   Chest pain 06/06/2015   GERD (gastroesophageal reflux disease) 06/06/2015   Costochondritis 06/06/2015     REFERRING PROVIDER: Kay Kemps, MD   REFERRING DIAG: M17.11 (ICD-10-CM) - Unilateral primary osteoarthritis, right knee  S/p R TKA   THERAPY DIAG:  Right knee pain, unspecified chronicity  Stiffness of right knee, not elsewhere classified  Muscle weakness (generalized)  Difficulty in walking, not elsewhere classified  Rationale for Evaluation and Treatment: Rehabilitation  ONSET DATE: DOS  12/25/23  SUBJECTIVE:   SUBJECTIVE STATEMENT: The patient reports significant tightness and pain yesterday. She reports feeling like she had a band around her leg.    PERTINENT HISTORY: R TKA  12/25/23 Arthritis including L knee, A-fib, anxiety, HTN   Pt reports having a fx in R hip 20 years ago.  PAIN:  NPRS:  1-2/10  Location:  R knee Type of pain: tightness > than pain  Nothing makes it worse, moving knee makes it better   PRECAUTIONS: Other: per surgical protocol for TKA    WEIGHT BEARING RESTRICTIONS: none indicated  FALLS:  Has patient fallen in last 6 months? No  LIVING ENVIRONMENT: Lives with: lives with their spouse Lives in: 1 story home Stairs: small step to enter/exit home  without rail Has following equipment at home: Single point cane, Walker - 2 wheeled, and Crutches, bedside toilet, shower stool  OCCUPATION: Pt is retired.   PLOF: Independent.  Pt ambulating without an AD.   PATIENT GOALS:  Pt wants to be able to do everything.  Be able to garden.  She wants to be able to get up and down with ease and play with her grandson.     NEXT MD VISIT: 01/07/2024  OBJECTIVE:  Note: Objective measures were completed at Evaluation unless otherwise noted.  DIAGNOSTIC FINDINGS: Pt is post op.   OBSERVATION:  Pt wearing bilat compression socks.  Aquacel bandage has been removed.  Incision intact and dry.  No signs of  infection.     LOWER EXTREMITY ROM:   ROM Right eval Right 8/29 Right 01/15/24 09/08 Right  09/10  Hip flexion       Hip extension       Hip abduction       Hip adduction       Hip internal rotation       Hip external rotation       Knee flexion 60 deg PROM 98 deg PROM Seated AROM 100* 107 Passive  110  Knee extension 5/4  AROM/PROM 4 deg AROM Seated LAQ 19*, supine quad set with heel prop 6*    Ankle dorsiflexion       Ankle plantarflexion       Ankle inversion       Ankle eversion        (Blank rows = not tested)   GAIT: Comments: slow gait, step to pattern with FWW.  Decreased Wb'ing through R LE and increased Wb'ing through UE's.                                                                                                                                TREATMENT:     9/19 Manual:  PROM into extension with distraction  Trigger point release to gastroc Review of self mobilization  Extension ROM with distraction  Trigger point release to posterior knee  Trigger point release to the quad   There-ex:  Supine SLR 3x12 LAQ 3x10 Quad set 2x15   There-act Step up 3x10 2 inch  Lateral step up 2x10 2 inch     9/17 Scifit bike x 5 mins  Pt received R knee flexion and extension PROM in supine.  Pt received OP into extension. Supine heel slides with strap x 10 reps  R knee extension AROM/PROM: 2/0 deg R knee flexion ROM:   113 deg   Supine SLR 2x10 LAQ 2x10 Seated HS curl with YTB 2x10 TKE with RTB 2x10 Step ups with UE support 2 inch 2 x 10 reps Attempted 4 inch step up and pt performed 3 reps though stopped due to pain  9/15 Manual:  PROM into extension with distraction  Trigger point release to gastroc Review of self  mobilization  Extension ROM with distraction  Trigger point release to posterior knee  Trigger point release to the quad   There-ex:  Review of HEP  Review of quad sets and their benefit   SAQ 3x10  LAQ 2x10  SLR 3x10    There-act: step up 3x10 2 inch  Lateral step up 3x10 2 inch     01/22/24  Scifit bike x8 minutes seat 10 x4 minutes full rotations, then seat 9 x4 minutes still able to do full rotations  Shuttle BLE press 75# x15 holds into extension and flexion Shuttle single leg press 37# x10 B  LAQs 3# 2x12  SAQs 3# 2x12 Bridges x10 with R knee in max tolerated flexion Gait training approximately 230ft SPC close S  Tandem stance solid surface 3x30 seconds Tandem walks along rail 2 laps down and back Tandem stance with head turns x2     9/10 Manual:  PROM into extension with distraction  Trigger point release to gastroc Review of self mobilization  Extension ROM with distraction  Trigger point release to posterior knee  Trigger point release to the quad   There-ex:  Review of HEP  Review of quad sets and their benefit   SAQ 3x10  LAQ 2x10  SLR 3x10   Standing weight shift.  9/8  Manual: Nu-step for range:  4 min L5   Manual: Trigger point release to gastroc Review of self mobilization  Extension ROM with distraction  Trigger point release to posterior knee  Trigger point release to the quad   There-ex:  SLR 2x10  SAQ 3x10 2#   Gait training: reviewed use of a cane using a gait belt for safety. Used a step through pattern until she fatigued.      PATIENT EDUCATION:  Education details:  post op protocol restrictions and expectations, ROM findings, sx response and management, HEP, dx, prognosis, gait, and POC.  PT answered pt's questions.  Person educated: Patient Education method: Explanation, Demonstration, Tactile cues, Verbal cues Education comprehension: verbalized understanding, returned demonstration, verbal cues required, and tactile cues required  HOME EXERCISE PROGRAM: Exercises - Supine Quadricep Sets  - 2 x daily - 7 x weekly - 2 sets - 10 reps - 5 seconds hold - ANKLE PUMPS  - 7 x weekly - Seated Passive Knee Extension  - 2-3 x daily - 7 x  weekly - 2 reps - 2 minutes hold -Sitting with knee flexed for 3-5 mins comfortably at various times for 6-7 times/day  Access Code: [email or web address] URL: [email or web address] Date: 12/28/2023 Prepared by: Mose Minerva  Exercises - Supine Gluteal Sets  - 2 x daily - 7 x weekly - 2 sets - 10 reps - 5 seconds hold - Supine Heel Slide  - 2 - 3 x daily - 7 x weekly - 1 sets - 10 reps  Updated HEP: - Supine Short Arc Quad  - 1 x daily - 7 x weekly - 2 sets - 10 reps - Supine Active Straight Leg Raise  - 2 x daily - 7 x weekly - 1 sets - 5 reps - Supine Heel Slide with Strap  - 2 x daily - 7 x weekly - 2 sets - 10 reps  ASSESSMENT:  CLINICAL IMPRESSION:  The patient did have a large trigger point in her VMO area today.  We reviewed self soft tissue mobilization to this area.  She also has some tenderness to palpation in her lateral distal quad.  She was advised she can  use heat and soft tissue mobilization to this area she has pain.  We reviewed ambulation with a cane today.  She tolerated well.  She has good stability with a cane.  She was encouraged to start trying to use this is much as she can at home.  Her range of motion was measured at 0 to 120 degrees today is progressing well.  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  We are unable to advance her to a 4 inch step today but she may be able to next visit.  .  OBJECTIVE IMPAIRMENTS: Abnormal gait, decreased activity tolerance, decreased endurance, decreased mobility, difficulty walking, decreased ROM, decreased strength, hypomobility, increased edema, impaired flexibility, and pain.   ACTIVITY LIMITATIONS: bending, standing, squatting, stairs, transfers, bed mobility, dressing, and locomotion level  PARTICIPATION LIMITATIONS: meal prep, cleaning, laundry, driving, shopping, and community activity  PERSONAL FACTORS: 1-2 comorbidities: L knee arthritis, A-fib are also affecting patient's functional outcome.   REHAB POTENTIAL:  Good  CLINICAL DECISION MAKING: Stable/uncomplicated  EVALUATION COMPLEXITY: Low   GOALS:   SHORT TERM GOALS:  Pt will be independent and compliant with HEP for improved pain, ROM, strength, and function.  Baseline: Goal status: MET 01/15/24 Target date:     2.  Pt will demo a good quad set and be able to perform a supine SLR independently for improved strength and stability with gait. Baseline:  Goal status: ONGOING 01/15/24 Target date:  01/25/24   3.  Pt will demo improved R knee AROM to 0 - 95 deg for improved mobility, stiffness, and gait.   Baseline:  Goal status: ONGOING 01/15/24 Target date:  01/25/24  4.  Pt will demo improved quality of gait and ambulate with a SPC with good stability.  Baseline:  Goal status: ONGOING  01/15/24 Target date:  01/25/24  5.  Pt will able to perform her normal daily transfers with no > than minimal difficulty.   Baseline:  Goal status: ONGOING 01/15/24 Target date: 02/22/2024    LONG TERM GOALS: Target date: 03/07/24  Pt will demo improved R knee AROM to 0 - 115 deg for improved mobility and stiffness and for improved performance of stairs.  Baseline:  Goal status: INITIAL  2.  Pt will ambulate with a normalized heel to toe gait without limping.  Baseline:  Goal status: INITIAL  3.  Pt will ambulate community distance without an AD without significant pain and difficulty.  Baseline:  Goal status: INITIAL  4.  Pt will be able to perform household chores without significant pain and difficulty.   Baseline:  Goal status: INITIAL  5.  Pt will be able to perform 4-5 steps with a reciprocal gait with the rail for improved performance of stairs.    Baseline:  Goal status:  INITIAL  6.  Pt will demo improved strength to 4+/5 in R hip abd, 4+ to 5/5 in Hip flexion, and 5/5 in knee ext and flexion for improved performance of daily functional mobility and to assist with returning to recreational activities.  Baseline:  Goal status:  INITIAL   PLAN:  PT FREQUENCY: 2-3x/wk x 3 weeks and 2x/wk afterwards  PT DURATION: 10 weeks  PLANNED INTERVENTIONS: 97164- PT Re-evaluation, 97750- Physical Performance Testing, 97110-Therapeutic exercises, 97530- Therapeutic activity, 97112- Neuromuscular re-education, 97535- Self Care, 02859- Manual therapy, [phone]- Gait training, [phone]- Aquatic Therapy, Patient/Family education, Balance training, Stair training, Taping, Joint mobilization, Spinal mobilization, Scar mobilization, DME instructions, Cryotherapy, and Moist heat  PLAN FOR NEXT SESSION:  Cont per TKA protocol.  Review and perform HEP, progressions PRN.  Knee ROM.  Gait training. Manual as desired/tolerated.   Alm Don PT DPT 01/29/24 2:29 PM

## 2024-02-01 ENCOUNTER — Encounter (HOSPITAL_BASED_OUTPATIENT_CLINIC_OR_DEPARTMENT_OTHER): Payer: Self-pay | Admitting: Physical Therapy

## 2024-02-01 ENCOUNTER — Ambulatory Visit (HOSPITAL_BASED_OUTPATIENT_CLINIC_OR_DEPARTMENT_OTHER): Admitting: Physical Therapy

## 2024-02-01 DIAGNOSIS — M25661 Stiffness of right knee, not elsewhere classified: Secondary | ICD-10-CM | POA: Diagnosis not present

## 2024-02-01 DIAGNOSIS — M6281 Muscle weakness (generalized): Secondary | ICD-10-CM

## 2024-02-01 DIAGNOSIS — M25561 Pain in right knee: Secondary | ICD-10-CM

## 2024-02-01 DIAGNOSIS — R262 Difficulty in walking, not elsewhere classified: Secondary | ICD-10-CM | POA: Diagnosis not present

## 2024-02-01 NOTE — Therapy (Signed)
 OUTPATIENT PHYSICAL THERAPY LOWER EXTREMITY TREATMENT   Patient Name: Dawn Haley MRN: [id number] DOB:11-11-1955, 68 y.o., female Today's Date: 02/01/2024  END OF SESSION:  PT End of Session - 02/01/24 1152     Visit Number 15    Number of Visits 22    Date for Recertification  03/07/24    PT Start Time 1149    PT Stop Time 1230    PT Time Calculation (min) 41 min    Activity Tolerance Patient tolerated treatment well    Behavior During Therapy WFL for tasks assessed/performed                    Past Medical History:  Diagnosis Date   Anxiety    Arthritis    Diverticulitis    Dyspnea    walking up hill   Dysrhythmia    PAF   Hypertension    Meniere disease of right ear    Pneumonia    Pre-diabetes    Past Surgical History:  Procedure Laterality Date   ABDOMINAL HYSTERECTOMY     APPENDECTOMY     COLON SURGERY  2022   Colectomy for colovaginal fistula & diverticulitis   CYST EXCISION Right 11/02/2015   Procedure: RIGHT INDEX EXCISION MASS ;  Surgeon: Franky Curia, MD;  Location: [facility];  Service: Orthopedics;  Laterality: Right;   CYSTOSCOPY Bilateral 11/09/2019   Procedure: CYSTOSCOPY WITH BILATERAL FIREFLY  INJECTION;  Surgeon: Devere Lonni Righter, MD;  Location: [facility];  Service: Urology;  Laterality: Bilateral;   DIAGNOSTIC LAPAROSCOPY     GYN   DILATION AND CURETTAGE OF UTERUS     TONSILLECTOMY     TOTAL KNEE ARTHROPLASTY Right 12/25/2023   Procedure: ARTHROPLASTY, KNEE, TOTAL;  Surgeon: Kay Kemps, MD;  Location: [facility];  Service: Orthopedics;  Laterality: Right;   URETHRAL DILATION     WISDOM TOOTH EXTRACTION     Patient Active Problem List   Diagnosis Date Noted   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   Paroxysmal atrial fibrillation (HCC) 02/17/2023   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   Hypocalcemia 10/27/2019   Sigmoid diverticulitis 10/26/2019   Chest pain 06/06/2015    GERD (gastroesophageal reflux disease) 06/06/2015   Costochondritis 06/06/2015     REFERRING PROVIDER: Kay Kemps, MD   REFERRING DIAG: M17.11 (ICD-10-CM) - Unilateral primary osteoarthritis, right knee  S/p R TKA   THERAPY DIAG:  Right knee pain, unspecified chronicity  Stiffness of right knee, not elsewhere classified  Muscle weakness (generalized)  Difficulty in walking, not elsewhere classified  Rationale for Evaluation and Treatment: Rehabilitation  ONSET DATE: DOS  12/25/23  SUBJECTIVE:   SUBJECTIVE STATEMENT: The patient reports significant tightness and pain yesterday. She reports feeling like she had a band around her leg.    PERTINENT HISTORY: R TKA  12/25/23 Arthritis including L knee, A-fib, anxiety, HTN   Pt reports having a fx in R hip 20 years ago.  PAIN:  NPRS:  1-2/10  Location:  R knee Type of pain: tightness > than pain  Nothing makes it worse, moving knee makes it better   PRECAUTIONS: Other: per surgical protocol for TKA    WEIGHT BEARING RESTRICTIONS: none indicated  FALLS:  Has patient fallen in last 6 months? No  LIVING ENVIRONMENT: Lives with: lives with their spouse Lives in: 1 story home Stairs: small step to enter/exit home without rail Has following equipment at home: Single point cane, Environmental consultant -  2 wheeled, and Crutches, bedside toilet, shower stool  OCCUPATION: Pt is retired.   PLOF: Independent.  Pt ambulating without an AD.   PATIENT GOALS:  Pt wants to be able to do everything.  Be able to garden.  She wants to be able to get up and down with ease and play with her grandson.     NEXT MD VISIT: 01/07/2024  OBJECTIVE:  Note: Objective measures were completed at Evaluation unless otherwise noted.  DIAGNOSTIC FINDINGS: Pt is post op.   OBSERVATION:  Pt wearing bilat compression socks.  Aquacel bandage has been removed.  Incision intact and dry.  No signs of infection.     LOWER EXTREMITY ROM:   ROM Right eval  Right 8/29 Right 01/15/24 09/08 Right  09/10  Hip flexion       Hip extension       Hip abduction       Hip adduction       Hip internal rotation       Hip external rotation       Knee flexion 60 deg PROM 98 deg PROM Seated AROM 100* 107 Passive  110  Knee extension 5/4  AROM/PROM 4 deg AROM Seated LAQ 19*, supine quad set with heel prop 6*    Ankle dorsiflexion       Ankle plantarflexion       Ankle inversion       Ankle eversion        (Blank rows = not tested)   GAIT: Comments: slow gait, step to pattern with FWW.  Decreased Wb'ing through R LE and increased Wb'ing through UE's.                                                                                                                                TREATMENT:   9/22 Manual:  PROM into extension with distraction  Trigger point release to gastroc Review of self mobilization  Extension ROM with distraction  Trigger point release to posterior knee  Trigger point release to the quad    There-ex:  SAQ 3x12 2.5 lbs RPE of 5  SLR 3x12  Quad set 3x12   There-act: Mini squats with education for technique 2x10  Step up 4 inch x10   9/19 Manual:  PROM into extension with distraction  Trigger point release to gastroc Review of self mobilization  Extension ROM with distraction  Trigger point release to posterior knee  Trigger point release to the quad   There-ex:  Supine SLR 3x12 LAQ 3x10 Quad set 2x15   There-act Step up 3x10 2 inch  Lateral step up 2x10 2 inch     9/17 Scifit bike x 5 mins  Pt received R knee flexion and extension PROM in supine.  Pt received OP into extension. Supine heel slides with strap x 10 reps  R knee extension AROM/PROM: 2/0 deg R knee flexion ROM:   113 deg  Supine SLR 2x10 LAQ 2x10 Seated HS curl with YTB 2x10 TKE with RTB 2x10 Step ups with UE support 2 inch 2 x 10 reps Attempted 4 inch step up and pt performed 3 reps though stopped due to pain  9/15 Manual:  PROM  into extension with distraction  Trigger point release to gastroc Review of self mobilization  Extension ROM with distraction  Trigger point release to posterior knee  Trigger point release to the quad   There-ex:  Review of HEP  Review of quad sets and their benefit   SAQ 3x10  LAQ 2x10  SLR 3x10   There-act: step up 3x10 2 inch  Lateral step up 3x10 2 inch     PATIENT EDUCATION:  Education details:  post op protocol restrictions and expectations, ROM findings, sx response and management, HEP, dx, prognosis, gait, and POC.  PT answered pt's questions.  Person educated: Patient Education method: Explanation, Demonstration, Tactile cues, Verbal cues Education comprehension: verbalized understanding, returned demonstration, verbal cues required, and tactile cues required  HOME EXERCISE PROGRAM: Exercises - Supine Quadricep Sets  - 2 x daily - 7 x weekly - 2 sets - 10 reps - 5 seconds hold - ANKLE PUMPS  - 7 x weekly - Seated Passive Knee Extension  - 2-3 x daily - 7 x weekly - 2 reps - 2 minutes hold -Sitting with knee flexed for 3-5 mins comfortably at various times for 6-7 times/day  Access Code: [email or web address] URL: [email or web address] Date: 12/28/2023 Prepared by: Mose Minerva  Exercises - Supine Gluteal Sets  - 2 x daily - 7 x weekly - 2 sets - 10 reps - 5 seconds hold - Supine Heel Slide  - 2 - 3 x daily - 7 x weekly - 1 sets - 10 reps  Updated HEP: - Supine Short Arc Quad  - 1 x daily - 7 x weekly - 2 sets - 10 reps - Supine Active Straight Leg Raise  - 2 x daily - 7 x weekly - 1 sets - 5 reps - Supine Heel Slide with Strap  - 2 x daily - 7 x weekly - 2 sets - 10 reps  ASSESSMENT:  CLINICAL IMPRESSION:  The patient continues to have trigger points in her quad but they are better then last time. We worked on squat technique today>s he tolerated well. She required only min cuing for technique. We increased the height of the step. She had pain so we only  did 1 set. Therapy will continue to progress as tolerated.   OBJECTIVE IMPAIRMENTS: Abnormal gait, decreased activity tolerance, decreased endurance, decreased mobility, difficulty walking, decreased ROM, decreased strength, hypomobility, increased edema, impaired flexibility, and pain.   ACTIVITY LIMITATIONS: bending, standing, squatting, stairs, transfers, bed mobility, dressing, and locomotion level  PARTICIPATION LIMITATIONS: meal prep, cleaning, laundry, driving, shopping, and community activity  PERSONAL FACTORS: 1-2 comorbidities: L knee arthritis, A-fib are also affecting patient's functional outcome.   REHAB POTENTIAL: Good  CLINICAL DECISION MAKING: Stable/uncomplicated  EVALUATION COMPLEXITY: Low   GOALS:   SHORT TERM GOALS:  Pt will be independent and compliant with HEP for improved pain, ROM, strength, and function.  Baseline: Goal status: MET 01/15/24 Target date:     2.  Pt will demo a good quad set and be able to perform a supine SLR independently for improved strength and stability with gait. Baseline:  Goal status: ONGOING 01/15/24 Target date:  01/25/24   3.  Pt will demo improved R knee AROM to  0 - 95 deg for improved mobility, stiffness, and gait.   Baseline:  Goal status: ONGOING 01/15/24 Target date:  01/25/24  4.  Pt will demo improved quality of gait and ambulate with a SPC with good stability.  Baseline:  Goal status: ONGOING  01/15/24 Target date:  01/25/24  5.  Pt will able to perform her normal daily transfers with no > than minimal difficulty.   Baseline:  Goal status: ONGOING 01/15/24 Target date: 02/22/2024    LONG TERM GOALS: Target date: 03/07/24  Pt will demo improved R knee AROM to 0 - 115 deg for improved mobility and stiffness and for improved performance of stairs.  Baseline:  Goal status: INITIAL  2.  Pt will ambulate with a normalized heel to toe gait without limping.  Baseline:  Goal status: INITIAL  3.  Pt will ambulate  community distance without an AD without significant pain and difficulty.  Baseline:  Goal status: INITIAL  4.  Pt will be able to perform household chores without significant pain and difficulty.   Baseline:  Goal status: INITIAL  5.  Pt will be able to perform 4-5 steps with a reciprocal gait with the rail for improved performance of stairs.    Baseline:  Goal status:  INITIAL  6.  Pt will demo improved strength to 4+/5 in R hip abd, 4+ to 5/5 in Hip flexion, and 5/5 in knee ext and flexion for improved performance of daily functional mobility and to assist with returning to recreational activities.  Baseline:  Goal status: INITIAL   PLAN:  PT FREQUENCY: 2-3x/wk x 3 weeks and 2x/wk afterwards  PT DURATION: 10 weeks  PLANNED INTERVENTIONS: 97164- PT Re-evaluation, 97750- Physical Performance Testing, 97110-Therapeutic exercises, 97530- Therapeutic activity, 97112- Neuromuscular re-education, 97535- Self Care, [id number]- Manual therapy, [phone]- Gait training, [phone]- Aquatic Therapy, Patient/Family education, Balance training, Stair training, Taping, Joint mobilization, Spinal mobilization, Scar mobilization, DME instructions, Cryotherapy, and Moist heat  PLAN FOR NEXT SESSION: Cont per TKA protocol.  Review and perform HEP, progressions PRN.  Knee ROM.  Gait training. Manual as desired/tolerated.   Alm Don PT DPT 02/01/24 12:09 PM

## 2024-02-02 ENCOUNTER — Ambulatory Visit (HOSPITAL_BASED_OUTPATIENT_CLINIC_OR_DEPARTMENT_OTHER): Admitting: Physical Therapy

## 2024-02-02 DIAGNOSIS — R262 Difficulty in walking, not elsewhere classified: Secondary | ICD-10-CM

## 2024-02-02 DIAGNOSIS — M6281 Muscle weakness (generalized): Secondary | ICD-10-CM

## 2024-02-02 DIAGNOSIS — M25661 Stiffness of right knee, not elsewhere classified: Secondary | ICD-10-CM

## 2024-02-02 DIAGNOSIS — M25561 Pain in right knee: Secondary | ICD-10-CM

## 2024-02-02 NOTE — Therapy (Signed)
 OUTPATIENT PHYSICAL THERAPY LOWER EXTREMITY TREATMENT   Patient Name: BLESSINGS INGLETT MRN: [id number] DOB:[birth year], 68 y.o., female Today's Date: 02/03/2024  END OF SESSION:  PT End of Session - 02/02/24 1033     Visit Number 16    Number of Visits 22    Date for Recertification  03/07/24    Authorization Type [employer] MCR progress note at visit 20    PT Start Time 1029    PT Stop Time 1109    PT Time Calculation (min) 40 min    Activity Tolerance Patient tolerated treatment well    Behavior During Therapy WFL for tasks assessed/performed                    Past Medical History:  Diagnosis Date   Anxiety    Arthritis    Diverticulitis    Dyspnea    walking up hill   Dysrhythmia    PAF   Hypertension    Meniere disease of right ear    Pneumonia    Pre-diabetes    Past Surgical History:  Procedure Laterality Date   ABDOMINAL HYSTERECTOMY     APPENDECTOMY     COLON SURGERY  2022   Colectomy for colovaginal fistula & diverticulitis   CYST EXCISION Right 11/02/2015   Procedure: RIGHT INDEX EXCISION MASS ;  Surgeon: Franky Curia, MD;  Location: [facility];  Service: Orthopedics;  Laterality: Right;   CYSTOSCOPY Bilateral 11/09/2019   Procedure: CYSTOSCOPY WITH BILATERAL FIREFLY  INJECTION;  Surgeon: Devere Lonni Righter, MD;  Location: [facility];  Service: Urology;  Laterality: Bilateral;   DIAGNOSTIC LAPAROSCOPY     GYN   DILATION AND CURETTAGE OF UTERUS     TONSILLECTOMY     TOTAL KNEE ARTHROPLASTY Right 12/25/2023   Procedure: ARTHROPLASTY, KNEE, TOTAL;  Surgeon: Kay Kemps, MD;  Location: [facility];  Service: Orthopedics;  Laterality: Right;   URETHRAL DILATION     WISDOM TOOTH EXTRACTION     Patient Active Problem List   Diagnosis Date Noted   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   Paroxysmal atrial fibrillation (HCC) 02/17/2023   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   Hypocalcemia 10/27/2019    Sigmoid diverticulitis 10/26/2019   Chest pain 06/06/2015   GERD (gastroesophageal reflux disease) 06/06/2015   Costochondritis 06/06/2015     REFERRING PROVIDER: Kay Kemps, MD   REFERRING DIAG: M17.11 (ICD-10-CM) - Unilateral primary osteoarthritis, right knee  S/p R TKA   THERAPY DIAG:  Right knee pain, unspecified chronicity  Stiffness of right knee, not elsewhere classified  Muscle weakness (generalized)  Difficulty in walking, not elsewhere classified  Rationale for Evaluation and Treatment: Rehabilitation  ONSET DATE: DOS  12/25/23  SUBJECTIVE:   SUBJECTIVE STATEMENT: Pt had PT yesterday and states she is sore today.  Pt took a mm relaxer and tylenol  this AM.  Pt reports she is performing her HEP though not on therapy days.  Pt began using the cane on Saturday.       PERTINENT HISTORY: R TKA  12/25/23 Arthritis including L knee, A-fib, anxiety, HTN   Pt reports having a fx in R hip 20 years ago.  PAIN:  NPRS:  6/10  Location:  R knee Type of pain: tightness > than pain  Nothing makes it worse, moving knee makes it better   PRECAUTIONS: Other: per surgical protocol for TKA    WEIGHT BEARING RESTRICTIONS: none indicated  FALLS:  Has patient fallen  in last 6 months? No  LIVING ENVIRONMENT: Lives with: lives with their spouse Lives in: 1 story home Stairs: small step to enter/exit home without rail Has following equipment at home: Single point cane, Walker - 2 wheeled, and Crutches, bedside toilet, shower stool  OCCUPATION: Pt is retired.   PLOF: Independent.  Pt ambulating without an AD.   PATIENT GOALS:  Pt wants to be able to do everything.  Be able to garden.  She wants to be able to get up and down with ease and play with her grandson.     NEXT MD VISIT: 01/07/2024  OBJECTIVE:  Note: Objective measures were completed at Evaluation unless otherwise noted.  DIAGNOSTIC FINDINGS: Pt is post op.   OBSERVATION:  Pt wearing bilat compression  socks.  Aquacel bandage has been removed.  Incision intact and dry.  No signs of infection.     LOWER EXTREMITY ROM:   ROM Right eval Right 8/29 Right 01/15/24 09/08 Right  09/10  Hip flexion       Hip extension       Hip abduction       Hip adduction       Hip internal rotation       Hip external rotation       Knee flexion 60 deg PROM 98 deg PROM Seated AROM 100* 107 Passive  110  Knee extension 5/4  AROM/PROM 4 deg AROM Seated LAQ 19*, supine quad set with heel prop 6*    Ankle dorsiflexion       Ankle plantarflexion       Ankle inversion       Ankle eversion        (Blank rows = not tested)   GAIT: Comments: slow gait, step to pattern with FWW.  Decreased Wb'ing through R LE and increased Wb'ing through UE's.                                                                                                                                TREATMENT:   9/23 Gait:  Pt ambulated up/down hallway with SPC with cuing for heel to toe gait pattern.   Scifit bike x 5 mins LAQ 1.5# 2x10 Seated HS curls with YTB 2x10 Supine SLR 2x12 TKE RTB 2x10  Pt received R knee flexion and extension PROM in supine.  Pt received OP into extension.  Mini squats with UE support on rail 2x10 Step ups 4 inch step 2x10 with UE support Lateral step ups with UE support 2x10    9/22 Manual:  PROM into extension with distraction  Trigger point release to gastroc Review of self mobilization  Extension ROM with distraction  Trigger point release to posterior knee  Trigger point release to the quad    There-ex:  SAQ 3x12 2.5 lbs RPE of 5  SLR 3x12  Quad set 3x12   There-act: Mini squats with education for technique 2x10  Step  up 4 inch x10   9/19 Manual:  PROM into extension with distraction  Trigger point release to gastroc Review of self mobilization  Extension ROM with distraction  Trigger point release to posterior knee  Trigger point release to the quad   There-ex:  Supine SLR  3x12 LAQ 3x10 Quad set 2x15   There-act Step up 3x10 2 inch  Lateral step up 2x10 2 inch     9/17 Scifit bike x 5 mins  Pt received R knee flexion and extension PROM in supine.  Pt received OP into extension. Supine heel slides with strap x 10 reps  R knee extension AROM/PROM: 2/0 deg R knee flexion ROM:   113 deg   Supine SLR 2x10 LAQ 2x10 Seated HS curl with YTB 2x10 TKE with RTB 2x10 Step ups with UE support 2 inch 2 x 10 reps Attempted 4 inch step up and pt performed 3 reps though stopped due to pain  9/15 Manual:  PROM into extension with distraction  Trigger point release to gastroc Review of self mobilization  Extension ROM with distraction  Trigger point release to posterior knee  Trigger point release to the quad   There-ex:  Review of HEP  Review of quad sets and their benefit   SAQ 3x10  LAQ 2x10  SLR 3x10   There-act: step up 3x10 2 inch  Lateral step up 3x10 2 inch     PATIENT EDUCATION:  Education details:  post op protocol restrictions and expectations, exercise form, sx response and management, HEP, dx, prognosis, gait, and POC.  PT answered pt's questions.  Person educated: Patient Education method: Explanation, Demonstration, Tactile cues, Verbal cues Education comprehension: verbalized understanding, returned demonstration, verbal cues required, and tactile cues required  HOME EXERCISE PROGRAM: Exercises - Supine Quadricep Sets  - 2 x daily - 7 x weekly - 2 sets - 10 reps - 5 seconds hold - ANKLE PUMPS  - 7 x weekly - Seated Passive Knee Extension  - 2-3 x daily - 7 x weekly - 2 reps - 2 minutes hold -Sitting with knee flexed for 3-5 mins comfortably at various times for 6-7 times/day  Access Code: [email or web address] URL: [email or web address] Date: 12/28/2023 Prepared by: Mose Minerva  Exercises - Supine Gluteal Sets  - 2 x daily - 7 x weekly - 2 sets - 10 reps - 5 seconds hold - Supine Heel Slide  - 2 - 3 x daily - 7 x  weekly - 1 sets - 10 reps  Updated HEP: - Supine Short Arc Quad  - 1 x daily - 7 x weekly - 2 sets - 10 reps - Supine Active Straight Leg Raise  - 2 x daily - 7 x weekly - 1 sets - 5 reps - Supine Heel Slide with Strap  - 2 x daily - 7 x weekly - 2 sets - 10 reps  ASSESSMENT:  CLINICAL IMPRESSION:  Pt is 5 weeks and 4 days s/p R TKR and is progressing with knee ROM, LE strength, gait, and mobility.  She presents to treatment with a cane.  Pt performed therapeutic exercises and therapeutic activities well with cuing for correct form.  Pt tolerated knee PROM well.  Pt had good tolerance with exercises.  She responded well to treatment reporting improved pain to no > 1/10 after treatment though does report stiffness.  Pt will benefit from cont skilled PT per protocol to address goals, improve ROM and strength, and to assist with returning to desired  level of function.   OBJECTIVE IMPAIRMENTS: Abnormal gait, decreased activity tolerance, decreased endurance, decreased mobility, difficulty walking, decreased ROM, decreased strength, hypomobility, increased edema, impaired flexibility, and pain.   ACTIVITY LIMITATIONS: bending, standing, squatting, stairs, transfers, bed mobility, dressing, and locomotion level  PARTICIPATION LIMITATIONS: meal prep, cleaning, laundry, driving, shopping, and community activity  PERSONAL FACTORS: 1-2 comorbidities: L knee arthritis, A-fib are also affecting patient's functional outcome.   REHAB POTENTIAL: Good  CLINICAL DECISION MAKING: Stable/uncomplicated  EVALUATION COMPLEXITY: Low   GOALS:   SHORT TERM GOALS:  Pt will be independent and compliant with HEP for improved pain, ROM, strength, and function.  Baseline: Goal status: MET 01/15/24 Target date:     2.  Pt will demo a good quad set and be able to perform a supine SLR independently for improved strength and stability with gait. Baseline:  Goal status: ONGOING 01/15/24 Target date:   01/25/24   3.  Pt will demo improved R knee AROM to 0 - 95 deg for improved mobility, stiffness, and gait.   Baseline:  Goal status: ONGOING 01/15/24 Target date:  01/25/24  4.  Pt will demo improved quality of gait and ambulate with a SPC with good stability.  Baseline:  Goal status: ONGOING  01/15/24 Target date:  01/25/24  5.  Pt will able to perform her normal daily transfers with no > than minimal difficulty.   Baseline:  Goal status: ONGOING 01/15/24 Target date: 02/22/2024    LONG TERM GOALS: Target date: 03/07/24  Pt will demo improved R knee AROM to 0 - 115 deg for improved mobility and stiffness and for improved performance of stairs.  Baseline:  Goal status: INITIAL  2.  Pt will ambulate with a normalized heel to toe gait without limping.  Baseline:  Goal status: INITIAL  3.  Pt will ambulate community distance without an AD without significant pain and difficulty.  Baseline:  Goal status: INITIAL  4.  Pt will be able to perform household chores without significant pain and difficulty.   Baseline:  Goal status: INITIAL  5.  Pt will be able to perform 4-5 steps with a reciprocal gait with the rail for improved performance of stairs.    Baseline:  Goal status:  INITIAL  6.  Pt will demo improved strength to 4+/5 in R hip abd, 4+ to 5/5 in Hip flexion, and 5/5 in knee ext and flexion for improved performance of daily functional mobility and to assist with returning to recreational activities.  Baseline:  Goal status: INITIAL   PLAN:  PT FREQUENCY: 2-3x/wk x 3 weeks and 2x/wk afterwards  PT DURATION: 10 weeks  PLANNED INTERVENTIONS: 97164- PT Re-evaluation, 97750- Physical Performance Testing, 97110-Therapeutic exercises, 97530- Therapeutic activity, 97112- Neuromuscular re-education, 97535- Self Care, 02859- Manual therapy, [phone]- Gait training, [phone]- Aquatic Therapy, Patient/Family education, Balance training, Stair training, Taping, Joint mobilization, Spinal  mobilization, Scar mobilization, DME instructions, Cryotherapy, and Moist heat  PLAN FOR NEXT SESSION: Cont per TKA protocol.  Review and perform HEP, progressions PRN.  Knee ROM.  Gait training. Manual as desired/tolerated.   Leigh Minerva III PT, DPT 02/03/24 11:39 PM

## 2024-02-03 ENCOUNTER — Encounter (HOSPITAL_BASED_OUTPATIENT_CLINIC_OR_DEPARTMENT_OTHER): Payer: Self-pay | Admitting: Physical Therapy

## 2024-02-03 ENCOUNTER — Ambulatory Visit

## 2024-02-03 DIAGNOSIS — M8580 Other specified disorders of bone density and structure, unspecified site: Secondary | ICD-10-CM

## 2024-02-03 DIAGNOSIS — M85852 Other specified disorders of bone density and structure, left thigh: Secondary | ICD-10-CM | POA: Diagnosis not present

## 2024-02-03 DIAGNOSIS — M85851 Other specified disorders of bone density and structure, right thigh: Secondary | ICD-10-CM | POA: Diagnosis not present

## 2024-02-03 DIAGNOSIS — Z780 Asymptomatic menopausal state: Secondary | ICD-10-CM | POA: Diagnosis not present

## 2024-02-05 ENCOUNTER — Encounter (HOSPITAL_BASED_OUTPATIENT_CLINIC_OR_DEPARTMENT_OTHER): Payer: Self-pay | Admitting: Physical Therapy

## 2024-02-05 ENCOUNTER — Ambulatory Visit (HOSPITAL_BASED_OUTPATIENT_CLINIC_OR_DEPARTMENT_OTHER): Admitting: Physical Therapy

## 2024-02-05 DIAGNOSIS — M6281 Muscle weakness (generalized): Secondary | ICD-10-CM

## 2024-02-05 DIAGNOSIS — R262 Difficulty in walking, not elsewhere classified: Secondary | ICD-10-CM

## 2024-02-05 DIAGNOSIS — M25561 Pain in right knee: Secondary | ICD-10-CM

## 2024-02-05 DIAGNOSIS — M25661 Stiffness of right knee, not elsewhere classified: Secondary | ICD-10-CM

## 2024-02-05 NOTE — Therapy (Signed)
 OUTPATIENT PHYSICAL THERAPY LOWER EXTREMITY TREATMENT   Patient Name: Dawn Haley MRN: [id number] DOB:Dec 19, 1955, 68 y.o., female Today's Date: 02/05/2024  END OF SESSION:  PT End of Session - 02/05/24 1304     Visit Number 17    Number of Visits 22    Date for Recertification  03/07/24    Authorization Type BCBS MCR progress note at visit 20    PT Start Time 1302    PT Stop Time 1343    PT Time Calculation (min) 41 min    Activity Tolerance Patient tolerated treatment well    Behavior During Therapy WFL for tasks assessed/performed                    Past Medical History:  Diagnosis Date   Anxiety    Arthritis    Diverticulitis    Dyspnea    walking up hill   Dysrhythmia    PAF   Hypertension    Meniere disease of right ear    Pneumonia    Pre-diabetes    Past Surgical History:  Procedure Laterality Date   ABDOMINAL HYSTERECTOMY     APPENDECTOMY     COLON SURGERY  2022   Colectomy for colovaginal fistula & diverticulitis   CYST EXCISION Right 11/02/2015   Procedure: RIGHT INDEX EXCISION MASS ;  Surgeon: Franky Curia, MD;  Location: [facility];  Service: Orthopedics;  Laterality: Right;   CYSTOSCOPY Bilateral 11/09/2019   Procedure: CYSTOSCOPY WITH BILATERAL FIREFLY  INJECTION;  Surgeon: Devere Lonni Righter, MD;  Location: [facility];  Service: Urology;  Laterality: Bilateral;   DIAGNOSTIC LAPAROSCOPY     GYN   DILATION AND CURETTAGE OF UTERUS     TONSILLECTOMY     TOTAL KNEE ARTHROPLASTY Right 12/25/2023   Procedure: ARTHROPLASTY, KNEE, TOTAL;  Surgeon: Kay Kemps, MD;  Location: [facility];  Service: Orthopedics;  Laterality: Right;   URETHRAL DILATION     WISDOM TOOTH EXTRACTION     Patient Active Problem List   Diagnosis Date Noted   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   Paroxysmal atrial fibrillation (HCC) 02/17/2023   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   Hypocalcemia 10/27/2019    Sigmoid diverticulitis 10/26/2019   Chest pain 06/06/2015   GERD (gastroesophageal reflux disease) 06/06/2015   Costochondritis 06/06/2015     REFERRING PROVIDER: Kay Kemps, MD   REFERRING DIAG: M17.11 (ICD-10-CM) - Unilateral primary osteoarthritis, right knee  S/p R TKA   THERAPY DIAG:  Right knee pain, unspecified chronicity  Stiffness of right knee, not elsewhere classified  Muscle weakness (generalized)  Difficulty in walking, not elsewhere classified  Rationale for Evaluation and Treatment: Rehabilitation  ONSET DATE: DOS  12/25/23  SUBJECTIVE:   SUBJECTIVE STATEMENT: Pt had PT yesterday and states she is sore today.  Pt took a mm relaxer and tylenol  this AM.  Pt reports she is performing her HEP though not on therapy days.  Pt began using the cane on Saturday.       PERTINENT HISTORY: R TKA  12/25/23 Arthritis including L knee, A-fib, anxiety, HTN   Pt reports having a fx in R hip 20 years ago.  PAIN:  NPRS:  6/10  Location:  R knee Type of pain: tightness > than pain  Nothing makes it worse, moving knee makes it better   PRECAUTIONS: Other: per surgical protocol for TKA    WEIGHT BEARING RESTRICTIONS: none indicated  FALLS:  Has patient fallen  in last 6 months? No  LIVING ENVIRONMENT: Lives with: lives with their spouse Lives in: 1 story home Stairs: small step to enter/exit home without rail Has following equipment at home: Single point cane, Walker - 2 wheeled, and Crutches, bedside toilet, shower stool  OCCUPATION: Pt is retired.   PLOF: Independent.  Pt ambulating without an AD.   PATIENT GOALS:  Pt wants to be able to do everything.  Be able to garden.  She wants to be able to get up and down with ease and play with her grandson.     NEXT MD VISIT: 01/07/2024  OBJECTIVE:  Note: Objective measures were completed at Evaluation unless otherwise noted.  DIAGNOSTIC FINDINGS: Pt is post op.   OBSERVATION:  Pt wearing bilat compression  socks.  Aquacel bandage has been removed.  Incision intact and dry.  No signs of infection.     LOWER EXTREMITY ROM:   ROM Right eval Right 8/29 Right 01/15/24 09/08 Right  09/10  Hip flexion       Hip extension       Hip abduction       Hip adduction       Hip internal rotation       Hip external rotation       Knee flexion 60 deg PROM 98 deg PROM Seated AROM 100* 107 Passive  110  Knee extension 5/4  AROM/PROM 4 deg AROM Seated LAQ 19*, supine quad set with heel prop 6*    Ankle dorsiflexion       Ankle plantarflexion       Ankle inversion       Ankle eversion        (Blank rows = not tested)   GAIT: Comments: slow gait, step to pattern with FWW.  Decreased Wb'ing through R LE and increased Wb'ing through UE's.                                                                                                                                TREATMENT:   9/25 Manual:  PROM into extension with distraction  Trigger point release to gastroc Review of self mobilization  Extension ROM with distraction  Trigger point release to posterior knee  Trigger point release to the quad   There-ex:  SLR 3x10  SAQ 3x12 2.5 lbs  LAQ 1.5 lbs 3x12   There-act:  Step ups 4 inch step 2x10 with UE support Lateral step ups with UE support 2x10   There-act:  9/23 Gait:  Pt ambulated up/down hallway with SPC with cuing for heel to toe gait pattern.   Scifit bike x 5 mins LAQ 1.5# 2x10 Seated HS curls with YTB 2x10 Supine SLR 2x12 TKE RTB 2x10  Pt received R knee flexion and extension PROM in supine.  Pt received OP into extension.  Mini squats with UE support on rail 2x10 Step ups 4 inch step 2x10 with UE support Lateral  step ups with UE support 2x10    9/22 Manual:  PROM into extension with distraction  Trigger point release to gastroc Review of self mobilization  Extension ROM with distraction  Trigger point release to posterior knee  Trigger point release to the quad     There-ex:  SAQ 3x12 2.5 lbs RPE of 5  SLR 3x12  Quad set 3x12   There-act: Mini squats with education for technique 2x10  Step up 4 inch x10   9/19 Manual:  PROM into extension with distraction  Trigger point release to gastroc Review of self mobilization  Extension ROM with distraction  Trigger point release to posterior knee  Trigger point release to the quad   There-ex:  Supine SLR 3x12 LAQ 3x10 Quad set 2x15   There-act Step up 3x10 2 inch  Lateral step up 2x10 2 inch     9/17 Scifit bike x 5 mins  Pt received R knee flexion and extension PROM in supine.  Pt received OP into extension. Supine heel slides with strap x 10 reps  R knee extension AROM/PROM: 2/0 deg R knee flexion ROM:   113 deg   Supine SLR 2x10 LAQ 2x10 Seated HS curl with YTB 2x10 TKE with RTB 2x10 Step ups with UE support 2 inch 2 x 10 reps Attempted 4 inch step up and pt performed 3 reps though stopped due to pain  9/15 Manual:  PROM into extension with distraction  Trigger point release to gastroc Review of self mobilization  Extension ROM with distraction  Trigger point release to posterior knee  Trigger point release to the quad   There-ex:  Review of HEP  Review of quad sets and their benefit   SAQ 3x10  LAQ 2x10  SLR 3x10   There-act: step up 3x10 2 inch  Lateral step up 3x10 2 inch     PATIENT EDUCATION:  Education details:  post op protocol restrictions and expectations, exercise form, sx response and management, HEP, dx, prognosis, gait, and POC.  PT answered pt's questions.  Person educated: Patient Education method: Explanation, Demonstration, Tactile cues, Verbal cues Education comprehension: verbalized understanding, returned demonstration, verbal cues required, and tactile cues required  HOME EXERCISE PROGRAM: Exercises - Supine Quadricep Sets  - 2 x daily - 7 x weekly - 2 sets - 10 reps - 5 seconds hold - ANKLE PUMPS  - 7 x weekly - Seated Passive  Knee Extension  - 2-3 x daily - 7 x weekly - 2 reps - 2 minutes hold -Sitting with knee flexed for 3-5 mins comfortably at various times for 6-7 times/day  Access Code: [email or web address] URL: [email or web address] Date: 12/28/2023 Prepared by: Mose Minerva  Exercises - Supine Gluteal Sets  - 2 x daily - 7 x weekly - 2 sets - 10 reps - 5 seconds hold - Supine Heel Slide  - 2 - 3 x daily - 7 x weekly - 1 sets - 10 reps  Updated HEP: - Supine Short Arc Quad  - 1 x daily - 7 x weekly - 2 sets - 10 reps - Supine Active Straight Leg Raise  - 2 x daily - 7 x weekly - 1 sets - 5 reps - Supine Heel Slide with Strap  - 2 x daily - 7 x weekly - 2 sets - 10 reps  ASSESSMENT:  CLINICAL IMPRESSION: The patient is making good progress. We continue to work on advancing her weights as tolerated.  She tolerated 4 inch steps well  today. We will continue to progress as tolerated.   OBJECTIVE IMPAIRMENTS: Abnormal gait, decreased activity tolerance, decreased endurance, decreased mobility, difficulty walking, decreased ROM, decreased strength, hypomobility, increased edema, impaired flexibility, and pain.   ACTIVITY LIMITATIONS: bending, standing, squatting, stairs, transfers, bed mobility, dressing, and locomotion level  PARTICIPATION LIMITATIONS: meal prep, cleaning, laundry, driving, shopping, and community activity  PERSONAL FACTORS: 1-2 comorbidities: L knee arthritis, A-fib are also affecting patient's functional outcome.   REHAB POTENTIAL: Good  CLINICAL DECISION MAKING: Stable/uncomplicated  EVALUATION COMPLEXITY: Low   GOALS:   SHORT TERM GOALS:  Pt will be independent and compliant with HEP for improved pain, ROM, strength, and function.  Baseline: Goal status: MET 01/15/24 Target date:     2.  Pt will demo a good quad set and be able to perform a supine SLR independently for improved strength and stability with gait. Baseline:  Goal status: ONGOING 01/15/24 Target date:   01/25/24   3.  Pt will demo improved R knee AROM to 0 - 95 deg for improved mobility, stiffness, and gait.   Baseline:  Goal status: ONGOING 01/15/24 Target date:  01/25/24  4.  Pt will demo improved quality of gait and ambulate with a SPC with good stability.  Baseline:  Goal status: ONGOING  01/15/24 Target date:  01/25/24  5.  Pt will able to perform her normal daily transfers with no > than minimal difficulty.   Baseline:  Goal status: ONGOING 01/15/24 Target date: 02/22/2024    LONG TERM GOALS: Target date: 03/07/24  Pt will demo improved R knee AROM to 0 - 115 deg for improved mobility and stiffness and for improved performance of stairs.  Baseline:  Goal status: INITIAL  2.  Pt will ambulate with a normalized heel to toe gait without limping.  Baseline:  Goal status: INITIAL  3.  Pt will ambulate community distance without an AD without significant pain and difficulty.  Baseline:  Goal status: INITIAL  4.  Pt will be able to perform household chores without significant pain and difficulty.   Baseline:  Goal status: INITIAL  5.  Pt will be able to perform 4-5 steps with a reciprocal gait with the rail for improved performance of stairs.    Baseline:  Goal status:  INITIAL  6.  Pt will demo improved strength to 4+/5 in R hip abd, 4+ to 5/5 in Hip flexion, and 5/5 in knee ext and flexion for improved performance of daily functional mobility and to assist with returning to recreational activities.  Baseline:  Goal status: INITIAL   PLAN:  PT FREQUENCY: 2-3x/wk x 3 weeks and 2x/wk afterwards  PT DURATION: 10 weeks  PLANNED INTERVENTIONS: 97164- PT Re-evaluation, 97750- Physical Performance Testing, 97110-Therapeutic exercises, 97530- Therapeutic activity, 97112- Neuromuscular re-education, 97535- Self Care, [id number]- Manual therapy, [phone]- Gait training, [phone]- Aquatic Therapy, Patient/Family education, Balance training, Stair training, Taping, Joint mobilization, Spinal  mobilization, Scar mobilization, DME instructions, Cryotherapy, and Moist heat  PLAN FOR NEXT SESSION: Cont per TKA protocol.  Review and perform HEP, progressions PRN.  Knee ROM.  Gait training. Manual as desired/tolerated.   Alm Don PT DPT 02/05/24 1:05 PM

## 2024-02-09 ENCOUNTER — Other Ambulatory Visit: Payer: Medicare Other

## 2024-02-10 DIAGNOSIS — Z0000 Encounter for general adult medical examination without abnormal findings: Secondary | ICD-10-CM | POA: Diagnosis not present

## 2024-02-10 DIAGNOSIS — T466X5A Adverse effect of antihyperlipidemic and antiarteriosclerotic drugs, initial encounter: Secondary | ICD-10-CM | POA: Diagnosis not present

## 2024-02-10 DIAGNOSIS — M8580 Other specified disorders of bone density and structure, unspecified site: Secondary | ICD-10-CM | POA: Diagnosis not present

## 2024-02-10 DIAGNOSIS — E785 Hyperlipidemia, unspecified: Secondary | ICD-10-CM | POA: Diagnosis not present

## 2024-02-10 DIAGNOSIS — E559 Vitamin D deficiency, unspecified: Secondary | ICD-10-CM | POA: Diagnosis not present

## 2024-02-12 ENCOUNTER — Encounter (HOSPITAL_BASED_OUTPATIENT_CLINIC_OR_DEPARTMENT_OTHER): Payer: Self-pay | Admitting: Physical Therapy

## 2024-02-12 ENCOUNTER — Ambulatory Visit (HOSPITAL_BASED_OUTPATIENT_CLINIC_OR_DEPARTMENT_OTHER): Payer: Self-pay | Attending: Orthopedic Surgery | Admitting: Physical Therapy

## 2024-02-12 DIAGNOSIS — R262 Difficulty in walking, not elsewhere classified: Secondary | ICD-10-CM | POA: Insufficient documentation

## 2024-02-12 DIAGNOSIS — M6281 Muscle weakness (generalized): Secondary | ICD-10-CM | POA: Insufficient documentation

## 2024-02-12 DIAGNOSIS — M25561 Pain in right knee: Secondary | ICD-10-CM | POA: Insufficient documentation

## 2024-02-12 DIAGNOSIS — M25661 Stiffness of right knee, not elsewhere classified: Secondary | ICD-10-CM | POA: Insufficient documentation

## 2024-02-12 NOTE — Therapy (Signed)
 OUTPATIENT PHYSICAL THERAPY LOWER EXTREMITY TREATMENT   Patient Name: Dawn Haley MRN: [id number] DOB:Jun 17, 1955, 68 y.o., female Today's Date: 02/12/2024  END OF SESSION:  PT End of Session - 02/12/24 0803     Visit Number 18    Number of Visits 22    Date for Recertification  03/07/24    Authorization Type BCBS MCR progress note at visit 20    PT Start Time 0800    PT Stop Time 0843    PT Time Calculation (min) 43 min    Activity Tolerance Patient tolerated treatment well    Behavior During Therapy WFL for tasks assessed/performed                    Past Medical History:  Diagnosis Date   Anxiety    Arthritis    Diverticulitis    Dyspnea    walking up hill   Dysrhythmia    PAF   Hypertension    Meniere disease of right ear    Pneumonia    Pre-diabetes    Past Surgical History:  Procedure Laterality Date   ABDOMINAL HYSTERECTOMY     APPENDECTOMY     COLON SURGERY  2022   Colectomy for colovaginal fistula & diverticulitis   CYST EXCISION Right 11/02/2015   Procedure: RIGHT INDEX EXCISION MASS ;  Surgeon: Franky Curia, MD;  Location: [facility];  Service: Orthopedics;  Laterality: Right;   CYSTOSCOPY Bilateral 11/09/2019   Procedure: CYSTOSCOPY WITH BILATERAL FIREFLY  INJECTION;  Surgeon: Devere Lonni Righter, MD;  Location: [facility];  Service: Urology;  Laterality: Bilateral;   DIAGNOSTIC LAPAROSCOPY     GYN   DILATION AND CURETTAGE OF UTERUS     TONSILLECTOMY     TOTAL KNEE ARTHROPLASTY Right 12/25/2023   Procedure: ARTHROPLASTY, KNEE, TOTAL;  Surgeon: Kay Kemps, MD;  Location: [facility];  Service: Orthopedics;  Laterality: Right;   URETHRAL DILATION     WISDOM TOOTH EXTRACTION     Patient Active Problem List   Diagnosis Date Noted   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   Paroxysmal atrial fibrillation (HCC) 02/17/2023   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   Hypocalcemia 10/27/2019    Sigmoid diverticulitis 10/26/2019   Chest pain 06/06/2015   GERD (gastroesophageal reflux disease) 06/06/2015   Costochondritis 06/06/2015     REFERRING PROVIDER: Kay Kemps, MD   REFERRING DIAG: M17.11 (ICD-10-CM) - Unilateral primary osteoarthritis, right knee  S/p R TKA   THERAPY DIAG:  Right knee pain, unspecified chronicity  Stiffness of right knee, not elsewhere classified  Muscle weakness (generalized)  Difficulty in walking, not elsewhere classified  Rationale for Evaluation and Treatment: Rehabilitation  ONSET DATE: DOS  12/25/23  SUBJECTIVE:   SUBJECTIVE STATEMENT: The patient did a lot of walking the other day. She is sore today. She feels like it is hot.   PERTINENT HISTORY: R TKA  12/25/23 Arthritis including L knee, A-fib, anxiety, HTN   Pt reports having a fx in R hip 20 years ago.  PAIN:  NPRS:  6/10  Location:  R knee Type of pain: tightness > than pain  Nothing makes it worse, moving knee makes it better   PRECAUTIONS: Other: per surgical protocol for TKA    WEIGHT BEARING RESTRICTIONS: none indicated  FALLS:  Has patient fallen in last 6 months? No  LIVING ENVIRONMENT: Lives with: lives with their spouse Lives in: 1 story home Stairs: small step to enter/exit home  without rail Has following equipment at home: Single point cane, Walker - 2 wheeled, and Crutches, bedside toilet, shower stool  OCCUPATION: Pt is retired.   PLOF: Independent.  Pt ambulating without an AD.   PATIENT GOALS:  Pt wants to be able to do everything.  Be able to garden.  She wants to be able to get up and down with ease and play with her grandson.     NEXT MD VISIT: 01/07/2024  OBJECTIVE:  Note: Objective measures were completed at Evaluation unless otherwise noted.  DIAGNOSTIC FINDINGS: Pt is post op.   OBSERVATION:  Pt wearing bilat compression socks.  Aquacel bandage has been removed.  Incision intact and dry.  No signs of infection.     LOWER  EXTREMITY ROM:   ROM Right eval Right 8/29 Right 01/15/24 09/08 Right  09/10  Hip flexion       Hip extension       Hip abduction       Hip adduction       Hip internal rotation       Hip external rotation       Knee flexion 60 deg PROM 98 deg PROM Seated AROM 100* 107 Passive  110  Knee extension 5/4  AROM/PROM 4 deg AROM Seated LAQ 19*, supine quad set with heel prop 6*    Ankle dorsiflexion       Ankle plantarflexion       Ankle inversion       Ankle eversion        (Blank rows = not tested)   GAIT: Comments: slow gait, step to pattern with FWW.  Decreased Wb'ing through R LE and increased Wb'ing through UE's.                                                                                                                                TREATMENT:   10/3 Manual:  PROM into extension with distraction  Trigger point release to gastroc Review of self mobilization  Extension ROM with distraction  Trigger point release to posterior knee  Trigger point release to the quad  Edema massage   There-ex:  Quad sets x15  SLR 3x10  LAQ 3x10 2.5 lbs  SAQ 2.5 lbs 3x10   There-act:  Step down 2x10 2 inch  Step up 2x10 4 inch  Lateral step 2x10 4 inch     9/25 Manual:  PROM into extension with distraction  Trigger point release to gastroc Review of self mobilization  Extension ROM with distraction  Trigger point release to posterior knee  Trigger point release to the quad   There-ex:  SLR 3x10  SAQ 3x12 2.5 lbs  LAQ 1.5 lbs 3x12   There-act:  Step ups 4 inch step 2x10 with UE support Lateral step ups with UE support 2x10   There-act:  9/23 Gait:  Pt ambulated up/down hallway with SPC with cuing for heel to toe  gait pattern.   Scifit bike x 5 mins LAQ 1.5# 2x10 Seated HS curls with YTB 2x10 Supine SLR 2x12 TKE RTB 2x10  Pt received R knee flexion and extension PROM in supine.  Pt received OP into extension.  Mini squats with UE support on rail 2x10 Step  ups 4 inch step 2x10 with UE support Lateral step ups with UE support 2x10    9/22 Manual:  PROM into extension with distraction  Trigger point release to gastroc Review of self mobilization  Extension ROM with distraction  Trigger point release to posterior knee  Trigger point release to the quad    There-ex:  SAQ 3x12 2.5 lbs RPE of 5  SLR 3x12  Quad set 3x12   There-act: Mini squats with education for technique 2x10  Step up 4 inch x10   9/19 Manual:  PROM into extension with distraction  Trigger point release to gastroc Review of self mobilization  Extension ROM with distraction  Trigger point release to posterior knee  Trigger point release to the quad   There-ex:  Supine SLR 3x12 LAQ 3x10 Quad set 2x15   There-act Step up 3x10 2 inch  Lateral step up 2x10 2 inch       PATIENT EDUCATION:  Education details:  post op protocol restrictions and expectations, exercise form, sx response and management, HEP, dx, prognosis, gait, and POC.  PT answered pt's questions.  Person educated: Patient Education method: Explanation, Demonstration, Tactile cues, Verbal cues Education comprehension: verbalized understanding, returned demonstration, verbal cues required, and tactile cues required  HOME EXERCISE PROGRAM: Exercises - Supine Quadricep Sets  - 2 x daily - 7 x weekly - 2 sets - 10 reps - 5 seconds hold - ANKLE PUMPS  - 7 x weekly - Seated Passive Knee Extension  - 2-3 x daily - 7 x weekly - 2 reps - 2 minutes hold -Sitting with knee flexed for 3-5 mins comfortably at various times for 6-7 times/day  Access Code: [email or web address] URL: [email or web address] Date: 12/28/2023 Prepared by: Mose Minerva  Exercises - Supine Gluteal Sets  - 2 x daily - 7 x weekly - 2 sets - 10 reps - 5 seconds hold - Supine Heel Slide  - 2 - 3 x daily - 7 x weekly - 1 sets - 10 reps  Updated HEP: - Supine Short Arc Quad  - 1 x daily - 7 x weekly - 2 sets - 10 reps -  Supine Active Straight Leg Raise  - 2 x daily - 7 x weekly - 1 sets - 5 reps - Supine Heel Slide with Strap  - 2 x daily - 7 x weekly - 2 sets - 10 reps  ASSESSMENT:  CLINICAL IMPRESSION: Therapy added in step downs today> She could feel it but was able to complete the task. Overall she is progressing well. We will continue to advance functional strengthening as tolerated.  OBJECTIVE IMPAIRMENTS: Abnormal gait, decreased activity tolerance, decreased endurance, decreased mobility, difficulty walking, decreased ROM, decreased strength, hypomobility, increased edema, impaired flexibility, and pain.   ACTIVITY LIMITATIONS: bending, standing, squatting, stairs, transfers, bed mobility, dressing, and locomotion level  PARTICIPATION LIMITATIONS: meal prep, cleaning, laundry, driving, shopping, and community activity  PERSONAL FACTORS: 1-2 comorbidities: L knee arthritis, A-fib are also affecting patient's functional outcome.   REHAB POTENTIAL: Good  CLINICAL DECISION MAKING: Stable/uncomplicated  EVALUATION COMPLEXITY: Low   GOALS:   SHORT TERM GOALS:  Pt will be independent and compliant with HEP for improved pain,  ROM, strength, and function.  Baseline: Goal status: MET 01/15/24 Target date:     2.  Pt will demo a good quad set and be able to perform a supine SLR independently for improved strength and stability with gait. Baseline:  Goal status: ONGOING 01/15/24 Target date:  01/25/24   3.  Pt will demo improved R knee AROM to 0 - 95 deg for improved mobility, stiffness, and gait.   Baseline:  Goal status: ONGOING 01/15/24 Target date:  01/25/24  4.  Pt will demo improved quality of gait and ambulate with a SPC with good stability.  Baseline:  Goal status: ONGOING  01/15/24 Target date:  01/25/24  5.  Pt will able to perform her normal daily transfers with no > than minimal difficulty.   Baseline:  Goal status: ONGOING 01/15/24 Target date: 02/22/2024    LONG TERM GOALS: Target  date: 03/07/24  Pt will demo improved R knee AROM to 0 - 115 deg for improved mobility and stiffness and for improved performance of stairs.  Baseline:  Goal status: INITIAL  2.  Pt will ambulate with a normalized heel to toe gait without limping.  Baseline:  Goal status: INITIAL  3.  Pt will ambulate community distance without an AD without significant pain and difficulty.  Baseline:  Goal status: INITIAL  4.  Pt will be able to perform household chores without significant pain and difficulty.   Baseline:  Goal status: INITIAL  5.  Pt will be able to perform 4-5 steps with a reciprocal gait with the rail for improved performance of stairs.    Baseline:  Goal status:  INITIAL  6.  Pt will demo improved strength to 4+/5 in R hip abd, 4+ to 5/5 in Hip flexion, and 5/5 in knee ext and flexion for improved performance of daily functional mobility and to assist with returning to recreational activities.  Baseline:  Goal status: INITIAL   PLAN:  PT FREQUENCY: 2-3x/wk x 3 weeks and 2x/wk afterwards  PT DURATION: 10 weeks  PLANNED INTERVENTIONS: 97164- PT Re-evaluation, 97750- Physical Performance Testing, 97110-Therapeutic exercises, 97530- Therapeutic activity, 97112- Neuromuscular re-education, 97535- Self Care, 02859- Manual therapy, [phone]- Gait training, [phone]- Aquatic Therapy, Patient/Family education, Balance training, Stair training, Taping, Joint mobilization, Spinal mobilization, Scar mobilization, DME instructions, Cryotherapy, and Moist heat  PLAN FOR NEXT SESSION: Cont per TKA protocol.  Review and perform HEP, progressions PRN.  Knee ROM.  Gait training. Manual as desired/tolerated.   Alm Don PT DPT 02/12/24 8:08 AM

## 2024-02-16 ENCOUNTER — Ambulatory Visit: Payer: Self-pay | Admitting: Cardiology

## 2024-02-16 DIAGNOSIS — Z789 Other specified health status: Secondary | ICD-10-CM

## 2024-02-16 DIAGNOSIS — E785 Hyperlipidemia, unspecified: Secondary | ICD-10-CM

## 2024-02-17 ENCOUNTER — Ambulatory Visit (HOSPITAL_BASED_OUTPATIENT_CLINIC_OR_DEPARTMENT_OTHER): Admitting: Physical Therapy

## 2024-02-17 ENCOUNTER — Encounter (HOSPITAL_BASED_OUTPATIENT_CLINIC_OR_DEPARTMENT_OTHER): Payer: Self-pay | Admitting: Physical Therapy

## 2024-02-17 DIAGNOSIS — M25661 Stiffness of right knee, not elsewhere classified: Secondary | ICD-10-CM

## 2024-02-17 DIAGNOSIS — M25561 Pain in right knee: Secondary | ICD-10-CM | POA: Diagnosis not present

## 2024-02-17 DIAGNOSIS — R262 Difficulty in walking, not elsewhere classified: Secondary | ICD-10-CM | POA: Diagnosis not present

## 2024-02-17 DIAGNOSIS — M6281 Muscle weakness (generalized): Secondary | ICD-10-CM

## 2024-02-17 NOTE — Therapy (Signed)
 OUTPATIENT PHYSICAL THERAPY LOWER EXTREMITY TREATMENT   Patient Name: Dawn Haley MRN: [id number] DOB:[birth year], 68 y.o., female Today's Date: 02/18/2024  END OF SESSION:  PT End of Session - 02/17/24 1201     Visit Number 19    Number of Visits 22    Date for Recertification  03/07/24    Authorization Type [employer] MCR progress note at visit 20    PT Start Time 1154    PT Stop Time 1233    PT Time Calculation (min) 39 min    Activity Tolerance Patient tolerated treatment well    Behavior During Therapy WFL for tasks assessed/performed                     Past Medical History:  Diagnosis Date   Anxiety    Arthritis    Diverticulitis    Dyspnea    walking up hill   Dysrhythmia    PAF   Hypertension    Meniere disease of right ear    Pneumonia    Pre-diabetes    Past Surgical History:  Procedure Laterality Date   ABDOMINAL HYSTERECTOMY     APPENDECTOMY     COLON SURGERY  2022   Colectomy for colovaginal fistula & diverticulitis   CYST EXCISION Right 11/02/2015   Procedure: RIGHT INDEX EXCISION MASS ;  Surgeon: Franky Curia, MD;  Location: [facility];  Service: Orthopedics;  Laterality: Right;   CYSTOSCOPY Bilateral 11/09/2019   Procedure: CYSTOSCOPY WITH BILATERAL FIREFLY  INJECTION;  Surgeon: Devere Lonni Righter, MD;  Location: [facility];  Service: Urology;  Laterality: Bilateral;   DIAGNOSTIC LAPAROSCOPY     GYN   DILATION AND CURETTAGE OF UTERUS     TONSILLECTOMY     TOTAL KNEE ARTHROPLASTY Right 12/25/2023   Procedure: ARTHROPLASTY, KNEE, TOTAL;  Surgeon: Kay Kemps, MD;  Location: [facility];  Service: Orthopedics;  Laterality: Right;   URETHRAL DILATION     WISDOM TOOTH EXTRACTION     Patient Active Problem List   Diagnosis Date Noted   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   Paroxysmal atrial fibrillation (HCC) 02/17/2023   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   Hypocalcemia 10/27/2019    Sigmoid diverticulitis 10/26/2019   Chest pain 06/06/2015   GERD (gastroesophageal reflux disease) 06/06/2015   Costochondritis 06/06/2015     REFERRING PROVIDER: Kay Kemps, MD   REFERRING DIAG: M17.11 (ICD-10-CM) - Unilateral primary osteoarthritis, right knee  S/p R TKA   THERAPY DIAG:  Right knee pain, unspecified chronicity  Stiffness of right knee, not elsewhere classified  Muscle weakness (generalized)  Difficulty in walking, not elsewhere classified  Rationale for Evaluation and Treatment: Rehabilitation  ONSET DATE: DOS  12/25/23  SUBJECTIVE:   SUBJECTIVE STATEMENT: Pt states she was feeling good on Monday and she did too much.  Pt was on her feet most of the day.  Pt hasn't been using the cane in her home.  Pt is able to stand up without using UE's.  Pt reports improved balance in the shower.  It's getting better.  Pt denies any adverse effects after prior treatment.  She used ice after prior treatment.  Pt states she has had some cramping in R calf.    PERTINENT HISTORY: R TKA  12/25/23 Arthritis including L knee, A-fib, anxiety, HTN   Pt reports having a fx in R hip 20 years ago.  PAIN:  NPRS:  3-5/10  Location:  R knee Type  of pain: tightness > than pain  Nothing makes it worse, moving knee makes it better   PRECAUTIONS: Other: per surgical protocol for TKA    WEIGHT BEARING RESTRICTIONS: none indicated  FALLS:  Has patient fallen in last 6 months? No  LIVING ENVIRONMENT: Lives with: lives with their spouse Lives in: 1 story home Stairs: small step to enter/exit home without rail Has following equipment at home: Single point cane, Walker - 2 wheeled, and Crutches, bedside toilet, shower stool  OCCUPATION: Pt is retired.   PLOF: Independent.  Pt ambulating without an AD.   PATIENT GOALS:  Pt wants to be able to do everything.  Be able to garden.  She wants to be able to get up and down with ease and play with her grandson.     NEXT MD  VISIT: 01/07/2024  OBJECTIVE:  Note: Objective measures were completed at Evaluation unless otherwise noted.  DIAGNOSTIC FINDINGS: Pt is post op.   OBSERVATION:  Pt wearing bilat compression socks.  Aquacel bandage has been removed.  Incision intact and dry.  No signs of infection.     LOWER EXTREMITY ROM:   ROM Right eval Right 8/29 Right 01/15/24 09/08 Right  09/10  Hip flexion       Hip extension       Hip abduction       Hip adduction       Hip internal rotation       Hip external rotation       Knee flexion 60 deg PROM 98 deg PROM Seated AROM 100* 107 Passive  110  Knee extension 5/4  AROM/PROM 4 deg AROM Seated LAQ 19*, supine quad set with heel prop 6*    Ankle dorsiflexion       Ankle plantarflexion       Ankle inversion       Ankle eversion        (Blank rows = not tested)   GAIT: Comments: slow gait, step to pattern with FWW.  Decreased Wb'ing through R LE and increased Wb'ing through UE's.                                                                                                                                TREATMENT:   10/8 Bike x 5 mins SAQ 3x10  2.5# LAQ  3x10  2.5# Seated HS curls with RTB 3x10 Supine heel slides with strap x 10 reps  Pt received R knee flexion and extension PROM in supine.  Pt received R knee manual extension stretch with heel propped on half roll.    Step ups 2x10  6 inch Lateral step ups 2x10  6 inch Step down  4 inch 2x10   10/3 Manual:  PROM into extension with distraction  Trigger point release to gastroc Review of self mobilization  Extension ROM with distraction  Trigger point release to posterior knee  Trigger point release to the  quad  Edema massage   There-ex:  Quad sets x15  SLR 3x10  LAQ 3x10 2.5 lbs  SAQ 2.5 lbs 3x10   There-act:  Step down 2x10 2 inch  Step up 2x10 4 inch  Lateral step 2x10 4 inch     9/25 Manual:  PROM into extension with distraction  Trigger point release to  gastroc Review of self mobilization  Extension ROM with distraction  Trigger point release to posterior knee  Trigger point release to the quad   There-ex:  SLR 3x10  SAQ 3x12 2.5 lbs  LAQ 1.5 lbs 3x12   There-act:  Step ups 4 inch step 2x10 with UE support Lateral step ups with UE support 2x10   There-act:  9/23 Gait:  Pt ambulated up/down hallway with SPC with cuing for heel to toe gait pattern.   Scifit bike x 5 mins LAQ 1.5# 2x10 Seated HS curls with YTB 2x10 Supine SLR 2x12 TKE RTB 2x10  Pt received R knee flexion and extension PROM in supine.  Pt received OP into extension.  Mini squats with UE support on rail 2x10 Step ups 4 inch step 2x10 with UE support Lateral step ups with UE support 2x10    9/22 Manual:  PROM into extension with distraction  Trigger point release to gastroc Review of self mobilization  Extension ROM with distraction  Trigger point release to posterior knee  Trigger point release to the quad    There-ex:  SAQ 3x12 2.5 lbs RPE of 5  SLR 3x12  Quad set 3x12   There-act: Mini squats with education for technique 2x10  Step up 4 inch x10   9/19 Manual:  PROM into extension with distraction  Trigger point release to gastroc Review of self mobilization  Extension ROM with distraction  Trigger point release to posterior knee  Trigger point release to the quad   There-ex:  Supine SLR 3x12 LAQ 3x10 Quad set 2x15   There-act Step up 3x10 2 inch  Lateral step up 2x10 2 inch       PATIENT EDUCATION:  Education details:  post op protocol restrictions and expectations, exercise form, sx response and management, HEP, dx, prognosis, gait, and POC.  PT answered pt's questions.  Person educated: Patient Education method: Explanation, Demonstration, Tactile cues, Verbal cues Education comprehension: verbalized understanding, returned demonstration, verbal cues required, and tactile cues required  HOME EXERCISE  PROGRAM: Exercises - Supine Quadricep Sets  - 2 x daily - 7 x weekly - 2 sets - 10 reps - 5 seconds hold - ANKLE PUMPS  - 7 x weekly - Seated Passive Knee Extension  - 2-3 x daily - 7 x weekly - 2 reps - 2 minutes hold -Sitting with knee flexed for 3-5 mins comfortably at various times for 6-7 times/day  Access Code: [email or web address] URL: [email or web address] Date: 12/28/2023 Prepared by: Mose Minerva  Exercises - Supine Gluteal Sets  - 2 x daily - 7 x weekly - 2 sets - 10 reps - 5 seconds hold - Supine Heel Slide  - 2 - 3 x daily - 7 x weekly - 1 sets - 10 reps - Supine Short Arc Quad  - 1 x daily - 7 x weekly - 2 sets - 10 reps - Supine Active Straight Leg Raise  - 2 x daily - 7 x weekly - 1 sets - 5 reps - Supine Heel Slide with Strap  - 2 x daily - 7 x weekly - 2 sets - 10  reps  ASSESSMENT:  CLINICAL IMPRESSION: Pt is 7 weeks and 5 days s/p R TKA.  Pt is improving with function as evidenced by subjective reports.  Pt performed exercises well.  She tolerated PROM and stretching well.  PT increased height of step with all step activities and pt performed them well.  Pt is making progress with LE strength.  She responded well to treatment having no c/o's after treatment.  She should benefit from cont skilled PT per protocol to address ongoing goals and impairments and to improve overall function.        OBJECTIVE IMPAIRMENTS: Abnormal gait, decreased activity tolerance, decreased endurance, decreased mobility, difficulty walking, decreased ROM, decreased strength, hypomobility, increased edema, impaired flexibility, and pain.   ACTIVITY LIMITATIONS: bending, standing, squatting, stairs, transfers, bed mobility, dressing, and locomotion level  PARTICIPATION LIMITATIONS: meal prep, cleaning, laundry, driving, shopping, and community activity  PERSONAL FACTORS: 1-2 comorbidities: L knee arthritis, A-fib are also affecting patient's functional outcome.   REHAB POTENTIAL:  Good  CLINICAL DECISION MAKING: Stable/uncomplicated  EVALUATION COMPLEXITY: Low   GOALS:   SHORT TERM GOALS:  Pt will be independent and compliant with HEP for improved pain, ROM, strength, and function.  Baseline: Goal status: MET 01/15/24 Target date:     2.  Pt will demo a good quad set and be able to perform a supine SLR independently for improved strength and stability with gait. Baseline:  Goal status: ONGOING 01/15/24 Target date:  01/25/24   3.  Pt will demo improved R knee AROM to 0 - 95 deg for improved mobility, stiffness, and gait.   Baseline:  Goal status: ONGOING 01/15/24 Target date:  01/25/24  4.  Pt will demo improved quality of gait and ambulate with a SPC with good stability.  Baseline:  Goal status: ONGOING  01/15/24 Target date:  01/25/24  5.  Pt will able to perform her normal daily transfers with no > than minimal difficulty.   Baseline:  Goal status: ONGOING 01/15/24 Target date: 02/22/2024    LONG TERM GOALS: Target date: 03/07/24  Pt will demo improved R knee AROM to 0 - 115 deg for improved mobility and stiffness and for improved performance of stairs.  Baseline:  Goal status: INITIAL  2.  Pt will ambulate with a normalized heel to toe gait without limping.  Baseline:  Goal status: INITIAL  3.  Pt will ambulate community distance without an AD without significant pain and difficulty.  Baseline:  Goal status: INITIAL  4.  Pt will be able to perform household chores without significant pain and difficulty.   Baseline:  Goal status: INITIAL  5.  Pt will be able to perform 4-5 steps with a reciprocal gait with the rail for improved performance of stairs.    Baseline:  Goal status:  INITIAL  6.  Pt will demo improved strength to 4+/5 in R hip abd, 4+ to 5/5 in Hip flexion, and 5/5 in knee ext and flexion for improved performance of daily functional mobility and to assist with returning to recreational activities.  Baseline:  Goal status:  INITIAL   PLAN:  PT FREQUENCY: 2-3x/wk x 3 weeks and 2x/wk afterwards  PT DURATION: 10 weeks  PLANNED INTERVENTIONS: [id number]- PT Re-evaluation, 97750- Physical Performance Testing, 97110-Therapeutic exercises, 97530- Therapeutic activity, 97112- Neuromuscular re-education, 97535- Self Care, [id number]- Manual therapy, [phone]- Gait training, [phone]- Aquatic Therapy, Patient/Family education, Balance training, Stair training, Taping, Joint mobilization, Spinal mobilization, Scar mobilization, DME instructions, Cryotherapy, and Moist heat  PLAN FOR NEXT SESSION: Cont per TKA protocol.  Review and perform HEP, progressions PRN.  Knee ROM.  Gait training. Manual as desired/tolerated.   Leigh Minerva III PT, DPT 02/18/24 9:50 PM

## 2024-02-19 ENCOUNTER — Encounter (HOSPITAL_BASED_OUTPATIENT_CLINIC_OR_DEPARTMENT_OTHER): Payer: Self-pay | Admitting: Physical Therapy

## 2024-02-19 ENCOUNTER — Ambulatory Visit (HOSPITAL_BASED_OUTPATIENT_CLINIC_OR_DEPARTMENT_OTHER): Payer: Self-pay | Admitting: Physical Therapy

## 2024-02-19 DIAGNOSIS — M25561 Pain in right knee: Secondary | ICD-10-CM | POA: Diagnosis not present

## 2024-02-19 DIAGNOSIS — M6281 Muscle weakness (generalized): Secondary | ICD-10-CM | POA: Diagnosis not present

## 2024-02-19 DIAGNOSIS — R262 Difficulty in walking, not elsewhere classified: Secondary | ICD-10-CM | POA: Diagnosis not present

## 2024-02-19 DIAGNOSIS — M25661 Stiffness of right knee, not elsewhere classified: Secondary | ICD-10-CM

## 2024-02-19 NOTE — Therapy (Signed)
 OUTPATIENT PHYSICAL THERAPY LOWER EXTREMITY TREATMENT  Progress Note Reporting Period 12/28/2023 to 02/19/2024  See note below for Objective Data and Assessment of Progress/Goals.       Patient Name: Dawn Haley MRN: [id number] DOB:January 09, 1956, 68 y.o., female Today's Date: 02/19/2024  END OF SESSION:  PT End of Session - 02/19/24 0936     Visit Number 20    Number of Visits 28    Date for Recertification  03/18/24    Authorization Type [employer] MCR progress note at visit 20    PT Start Time 0854    PT Stop Time 0938    PT Time Calculation (min) 44 min    Activity Tolerance Patient tolerated treatment well    Behavior During Therapy WFL for tasks assessed/performed                      Past Medical History:  Diagnosis Date   Anxiety    Arthritis    Diverticulitis    Dyspnea    walking up hill   Dysrhythmia    PAF   Hypertension    Meniere disease of right ear    Pneumonia    Pre-diabetes    Past Surgical History:  Procedure Laterality Date   ABDOMINAL HYSTERECTOMY     APPENDECTOMY     COLON SURGERY  2022   Colectomy for colovaginal fistula & diverticulitis   CYST EXCISION Right 11/02/2015   Procedure: RIGHT INDEX EXCISION MASS ;  Surgeon: Franky Curia, MD;  Location: [facility];  Service: Orthopedics;  Laterality: Right;   CYSTOSCOPY Bilateral 11/09/2019   Procedure: CYSTOSCOPY WITH BILATERAL FIREFLY  INJECTION;  Surgeon: Devere Lonni Righter, MD;  Location: [facility];  Service: Urology;  Laterality: Bilateral;   DIAGNOSTIC LAPAROSCOPY     GYN   DILATION AND CURETTAGE OF UTERUS     TONSILLECTOMY     TOTAL KNEE ARTHROPLASTY Right 12/25/2023   Procedure: ARTHROPLASTY, KNEE, TOTAL;  Surgeon: Kay Kemps, MD;  Location: [facility];  Service: Orthopedics;  Laterality: Right;   URETHRAL DILATION     WISDOM TOOTH EXTRACTION     Patient Active Problem List   Diagnosis Date Noted   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   Paroxysmal  atrial fibrillation (HCC) 02/17/2023   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   Hypocalcemia 10/27/2019   Sigmoid diverticulitis 10/26/2019   Chest pain 06/06/2015   GERD (gastroesophageal reflux disease) 06/06/2015   Costochondritis 06/06/2015     REFERRING PROVIDER: Kay Kemps, MD   REFERRING DIAG: M17.11 (ICD-10-CM) - Unilateral primary osteoarthritis, right knee  S/p R TKA   THERAPY DIAG:  Right knee pain, unspecified chronicity  Stiffness of right knee, not elsewhere classified  Muscle weakness (generalized)  Difficulty in walking, not elsewhere classified  Rationale for Evaluation and Treatment: Rehabilitation  ONSET DATE: DOS  12/25/23  SUBJECTIVE:   SUBJECTIVE STATEMENT: Pt left the house and forgot her cane this AM.  Pt drove yesterday for the 1st time and had a slight ache afterwards.  Pt is able to squat deeper.  Pt is able to stand up from the toilet without using hands.  Pt reports improved performance of car transfers.  She has improved walking and is using cane less.  Pt reports improved pain.  Pt has difficulty sleeping though some nights are better than others.  Pt is able to stand longer in the shower and it's easier getting in/out of the shower. Pt reports  having an ache after prior treatment and having increased pain later.     PERTINENT HISTORY: R TKA  12/25/23 Arthritis including L knee, A-fib, anxiety, HTN   Pt reports having a fx in R hip 20 years ago.  PAIN:  NPRS:  4/10 current, 6/10 worst, 0/10 best  Location:  R knee Type of pain: tightness > than pain  Nothing makes it worse, moving knee makes it better   PRECAUTIONS: Other: per surgical protocol for TKA    WEIGHT BEARING RESTRICTIONS: none indicated  FALLS:  Has patient fallen in last 6 months? No  LIVING ENVIRONMENT: Lives with: lives with their spouse Lives in: 1 story home Stairs: small step to enter/exit home without rail Has following equipment  at home: Single point cane, Walker - 2 wheeled, and Crutches, bedside toilet, shower stool  OCCUPATION: Pt is retired.   PLOF: Independent.  Pt ambulating without an AD.   PATIENT GOALS:  Pt wants to be able to do everything.  Be able to garden.  She wants to be able to get up and down with ease and play with her grandson.     NEXT MD VISIT: 01/07/2024  OBJECTIVE:  Note: Objective measures were completed at Evaluation unless otherwise noted.  DIAGNOSTIC FINDINGS: Pt is post op.   OBSERVATION:  Pt wearing bilat compression socks.  Aquacel bandage has been removed.  Incision intact and dry.  No signs of infection.     LOWER EXTREMITY ROM:   ROM Right eval Right 8/29 Right 01/15/24 09/08 Right  09/10 10/10 Right  Hip flexion        Hip extension        Hip abduction        Hip adduction        Hip internal rotation        Hip external rotation        Knee flexion 60 deg PROM 98 deg PROM Seated AROM 100* 107 Passive  110 120 AROM  Knee extension 5/4  AROM/PROM 4 deg AROM Seated LAQ 19*, supine quad set with heel prop 6*   0 deg AROM  Ankle dorsiflexion        Ankle plantarflexion        Ankle inversion        Ankle eversion         (Blank rows = not tested)   GAIT: Comments: slow gait, step to pattern with FWW.  Decreased Wb'ing through R LE and increased Wb'ing through UE's.                                                                                                                                TREATMENT:   10/10 Bike x 5 mins Supine heel slides with strap x 10 reps  Pt received sup/inf patellar mobs f/b R knee flexion and extension PROM in supine.  Pt received R knee manual extension stretch with heel  propped on half roll.   PT assessed ROM.  See above.   SAQ 3x10  2.5# LAQ  3x10  2.5# Seated HS curls with RTB 3x10  Step ups 3x10  6 inch Lateral step ups 2x10  6 inch Step down  4 inch 2x10  LEFS:  44/80   10/8 Bike x 5 mins SAQ 3x10  2.5# LAQ   3x10  2.5# Seated HS curls with RTB 3x10 Supine heel slides with strap x 10 reps  Pt received R knee flexion and extension PROM in supine.  Pt received R knee manual extension stretch with heel propped on half roll.    Step ups 2x10  6 inch Lateral step ups 2x10  6 inch Step down  4 inch 2x10   10/3 Manual:  PROM into extension with distraction  Trigger point release to gastroc Review of self mobilization  Extension ROM with distraction  Trigger point release to posterior knee  Trigger point release to the quad  Edema massage   There-ex:  Quad sets x15  SLR 3x10  LAQ 3x10 2.5 lbs  SAQ 2.5 lbs 3x10   There-act:  Step down 2x10 2 inch  Step up 2x10 4 inch  Lateral step 2x10 4 inch     9/25 Manual:  PROM into extension with distraction  Trigger point release to gastroc Review of self mobilization  Extension ROM with distraction  Trigger point release to posterior knee  Trigger point release to the quad   There-ex:  SLR 3x10  SAQ 3x12 2.5 lbs  LAQ 1.5 lbs 3x12   There-act:  Step ups 4 inch step 2x10 with UE support Lateral step ups with UE support 2x10   There-act:  9/23 Gait:  Pt ambulated up/down hallway with SPC with cuing for heel to toe gait pattern.   Scifit bike x 5 mins LAQ 1.5# 2x10 Seated HS curls with YTB 2x10 Supine SLR 2x12 TKE RTB 2x10  Pt received R knee flexion and extension PROM in supine.  Pt received OP into extension.  Mini squats with UE support on rail 2x10 Step ups 4 inch step 2x10 with UE support Lateral step ups with UE support 2x10    9/22 Manual:  PROM into extension with distraction  Trigger point release to gastroc Review of self mobilization  Extension ROM with distraction  Trigger point release to posterior knee  Trigger point release to the quad    There-ex:  SAQ 3x12 2.5 lbs RPE of 5  SLR 3x12  Quad set 3x12   There-act: Mini squats with education for technique 2x10  Step up 4 inch x10    9/19 Manual:  PROM into extension with distraction  Trigger point release to gastroc Review of self mobilization  Extension ROM with distraction  Trigger point release to posterior knee  Trigger point release to the quad   There-ex:  Supine SLR 3x12 LAQ 3x10 Quad set 2x15   There-act Step up 3x10 2 inch  Lateral step up 2x10 2 inch       PATIENT EDUCATION:  Education details:  post op protocol restrictions and expectations, exercise form, sx response and management, HEP, dx, prognosis, gait, and POC.  PT answered pt's questions.  Person educated: Patient Education method: Explanation, Demonstration, Tactile cues, Verbal cues Education comprehension: verbalized understanding, returned demonstration, verbal cues required, and tactile cues required  HOME EXERCISE PROGRAM: Exercises - Supine Quadricep Sets  - 2 x daily - 7 x weekly - 2 sets -  10 reps - 5 seconds hold - ANKLE PUMPS  - 7 x weekly - Seated Passive Knee Extension  - 2-3 x daily - 7 x weekly - 2 reps - 2 minutes hold -Sitting with knee flexed for 3-5 mins comfortably at various times for 6-7 times/day  Access Code: [email or web address] URL: [email or web address] Date: 12/28/2023 Prepared by: Mose Minerva  Exercises - Supine Gluteal Sets  - 2 x daily - 7 x weekly - 2 sets - 10 reps - 5 seconds hold - Supine Heel Slide  - 2 - 3 x daily - 7 x weekly - 1 sets - 10 reps - Supine Short Arc Quad  - 1 x daily - 7 x weekly - 2 sets - 10 reps - Supine Active Straight Leg Raise  - 2 x daily - 7 x weekly - 1 sets - 5 reps - Supine Heel Slide with Strap  - 2 x daily - 7 x weekly - 2 sets - 10 reps  ASSESSMENT:  CLINICAL IMPRESSION: Pt is 8 weeks s/p R TKA and making good progress.  She forgot her cane this AM and enters the clinic without AD.  Pt is improving with gait and is using cane less.  Pt is improving with functional mobility as evidenced by subjective reports of improved performance of transfers,  ambulation, and squatting.  She reports she is able to stand longer in the shower.  PT is progressing exercises and she has been improving with exercise tolerance.  She has progressed well with knee ROM and demonstrates 0-120 deg of AROM today.  Pt continues to have weakness in R LE though is improving.  Pt demonstrates clinically significant improvement in self perceived disability with LEFS improving from 6/80 initially to 44/80 currently.  Pt met STG's # 1,3,4,5 and LTG #1.  She is progressing toward other goals.  She should benefit from cont skilled PT per protocol to address ongoing goals and impairments and to improve overall function.        OBJECTIVE IMPAIRMENTS: Abnormal gait, decreased activity tolerance, decreased endurance, decreased mobility, difficulty walking, decreased ROM, decreased strength, hypomobility, increased edema, impaired flexibility, and pain.   ACTIVITY LIMITATIONS: bending, standing, squatting, stairs, transfers, bed mobility, dressing, and locomotion level  PARTICIPATION LIMITATIONS: meal prep, cleaning, laundry, driving, shopping, and community activity  PERSONAL FACTORS: 1-2 comorbidities: L knee arthritis, A-fib are also affecting patient's functional outcome.   REHAB POTENTIAL: Good  CLINICAL DECISION MAKING: Stable/uncomplicated  EVALUATION COMPLEXITY: Low   GOALS:   SHORT TERM GOALS:  Pt will be independent and compliant with HEP for improved pain, ROM, strength, and function.  Baseline: Goal status: MET 01/15/24 Target date:     2.  Pt will demo a good quad set and be able to perform a supine SLR independently for improved strength and stability with gait. Baseline:  Goal status: ONGOING 01/15/24 Target date:  01/25/24   3.  Pt will demo improved R knee AROM to 0 - 95 deg for improved mobility, stiffness, and gait.   Baseline:  Goal status: GOAL MET  10/10 Target date:  01/25/24  4.  Pt will demo improved quality of gait and ambulate with a SPC  with good stability.  Baseline:  Goal status: GOAL MET  10/10 Target date:  01/25/24  5.  Pt will able to perform her normal daily transfers with no > than minimal difficulty.   Baseline:  Goal status: GOAL MET  10/10 Target date: 02/22/2024    LONG TERM  GOALS: Target date: 03/18/24  Pt will demo improved R knee AROM to 0 - 115 deg for improved mobility and stiffness and for improved performance of stairs.  Baseline:  Goal status: GOAL MET  10/10  2.  Pt will ambulate with a normalized heel to toe gait without limping.  Baseline:  Goal status:  PROGRESSING  3.  Pt will ambulate community distance without an AD without significant pain and difficulty.  Baseline:  Goal status: PROGRESSING  4.  Pt will be able to perform household chores without significant pain and difficulty.   Baseline:  Goal status: ONGOING  5.  Pt will be able to perform 4-5 steps with a reciprocal gait with the rail for improved performance of stairs.    Baseline:  Goal status:  INITIAL  6.  Pt will demo improved strength to 4+/5 in R hip abd, 4+ to 5/5 in Hip flexion, and 5/5 in knee ext and flexion for improved performance of daily functional mobility and to assist with returning to recreational activities.  Baseline:  Goal status: INITIAL   PLAN:  PT FREQUENCY: 2-3x/wk x 3 weeks and 2x/wk afterwards  PT DURATION: 10 weeks  PLANNED INTERVENTIONS: [id number]- PT Re-evaluation, 97750- Physical Performance Testing, 97110-Therapeutic exercises, 97530- Therapeutic activity, 97112- Neuromuscular re-education, 97535- Self Care, [id number]- Manual therapy, [phone]- Gait training, [phone]- Aquatic Therapy, Patient/Family education, Balance training, Stair training, Taping, Joint mobilization, Spinal mobilization, Scar mobilization, DME instructions, Cryotherapy, and Moist heat  PLAN FOR NEXT SESSION: Cont per TKA protocol.  Review and perform HEP, progressions PRN.  Knee ROM.  Gait training. Manual as desired/tolerated.    Leigh Minerva III PT, DPT 02/19/24 12:30 PM

## 2024-02-22 ENCOUNTER — Ambulatory Visit (HOSPITAL_BASED_OUTPATIENT_CLINIC_OR_DEPARTMENT_OTHER): Admitting: Physical Therapy

## 2024-02-22 ENCOUNTER — Encounter (HOSPITAL_BASED_OUTPATIENT_CLINIC_OR_DEPARTMENT_OTHER): Payer: Self-pay | Admitting: Physical Therapy

## 2024-02-22 DIAGNOSIS — M25561 Pain in right knee: Secondary | ICD-10-CM

## 2024-02-22 DIAGNOSIS — M6281 Muscle weakness (generalized): Secondary | ICD-10-CM | POA: Diagnosis not present

## 2024-02-22 DIAGNOSIS — R262 Difficulty in walking, not elsewhere classified: Secondary | ICD-10-CM

## 2024-02-22 DIAGNOSIS — M25661 Stiffness of right knee, not elsewhere classified: Secondary | ICD-10-CM

## 2024-02-22 NOTE — Therapy (Signed)
 OUTPATIENT PHYSICAL THERAPY LOWER EXTREMITY TREATMENT  Progress Note Reporting Period 12/28/2023 to 02/19/2024  See note below for Objective Data and Assessment of Progress/Goals.       Patient Name: Dawn Haley MRN: [id number] DOB:1956/03/26, 68 y.o., female Today's Date: 02/23/2024  END OF SESSION:  PT End of Session - 02/22/24 1321     Visit Number 21    Number of Visits 28    Date for Recertification  03/18/24    Authorization Type BCBS MCR progress note at visit 20    PT Start Time 1318    PT Stop Time 1358    PT Time Calculation (min) 40 min    Activity Tolerance Patient tolerated treatment well    Behavior During Therapy WFL for tasks assessed/performed                      Past Medical History:  Diagnosis Date   Anxiety    Arthritis    Diverticulitis    Dyspnea    walking up hill   Dysrhythmia    PAF   Hypertension    Meniere disease of right ear    Pneumonia    Pre-diabetes    Past Surgical History:  Procedure Laterality Date   ABDOMINAL HYSTERECTOMY     APPENDECTOMY     COLON SURGERY  2022   Colectomy for colovaginal fistula & diverticulitis   CYST EXCISION Right 11/02/2015   Procedure: RIGHT INDEX EXCISION MASS ;  Surgeon: Franky Curia, MD;  Location: [facility];  Service: Orthopedics;  Laterality: Right;   CYSTOSCOPY Bilateral 11/09/2019   Procedure: CYSTOSCOPY WITH BILATERAL FIREFLY  INJECTION;  Surgeon: Devere Lonni Righter, MD;  Location: [facility];  Service: Urology;  Laterality: Bilateral;   DIAGNOSTIC LAPAROSCOPY     GYN   DILATION AND CURETTAGE OF UTERUS     TONSILLECTOMY     TOTAL KNEE ARTHROPLASTY Right 12/25/2023   Procedure: ARTHROPLASTY, KNEE, TOTAL;  Surgeon: Kay Kemps, MD;  Location: [facility];  Service: Orthopedics;  Laterality: Right;   URETHRAL DILATION     WISDOM TOOTH EXTRACTION     Patient Active Problem List   Diagnosis Date Noted   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   Paroxysmal  atrial fibrillation (HCC) 02/17/2023   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   Hypocalcemia 10/27/2019   Sigmoid diverticulitis 10/26/2019   Chest pain 06/06/2015   GERD (gastroesophageal reflux disease) 06/06/2015   Costochondritis 06/06/2015     REFERRING PROVIDER: Kay Kemps, MD   REFERRING DIAG: M17.11 (ICD-10-CM) - Unilateral primary osteoarthritis, right knee  S/p R TKA   THERAPY DIAG:  Right knee pain, unspecified chronicity  Stiffness of right knee, not elsewhere classified  Muscle weakness (generalized)  Difficulty in walking, not elsewhere classified  Rationale for Evaluation and Treatment: Rehabilitation  ONSET DATE: DOS  12/25/23  SUBJECTIVE:   SUBJECTIVE STATEMENT: The patient has not been using the cane around the house. She has forgot it leaving the house some too. She feels like she is progressing.    Pt reports having an ache after prior treatment and having increased pain later.     PERTINENT HISTORY: R TKA  12/25/23 Arthritis including L knee, A-fib, anxiety, HTN   Pt reports having a fx in R hip 20 years ago.  PAIN:  NPRS:  4/10 current, 6/10 worst, 0/10 best  Location:  R knee Type of pain: tightness > than pain  Nothing makes it  worse, moving knee makes it better   PRECAUTIONS: Other: per surgical protocol for TKA    WEIGHT BEARING RESTRICTIONS: none indicated  FALLS:  Has patient fallen in last 6 months? No  LIVING ENVIRONMENT: Lives with: lives with their spouse Lives in: 1 story home Stairs: small step to enter/exit home without rail Has following equipment at home: Single point cane, Walker - 2 wheeled, and Crutches, bedside toilet, shower stool  OCCUPATION: Pt is retired.   PLOF: Independent.  Pt ambulating without an AD.   PATIENT GOALS:  Pt wants to be able to do everything.  Be able to garden.  She wants to be able to get up and down with ease and play with her grandson.     NEXT MD VISIT:  01/07/2024  OBJECTIVE:  Note: Objective measures were completed at Evaluation unless otherwise noted.  DIAGNOSTIC FINDINGS: Pt is post op.   OBSERVATION:  Pt wearing bilat compression socks.  Aquacel bandage has been removed.  Incision intact and dry.  No signs of infection.     LOWER EXTREMITY ROM:   ROM Right eval Right 8/29 Right 01/15/24 09/08 Right  09/10 10/10 Right 10/14 Right   Hip flexion         Hip extension         Hip abduction         Hip adduction         Hip internal rotation         Hip external rotation         Knee flexion 60 deg PROM 98 deg PROM Seated AROM 100* 107 Passive  110 120 AROM 118  Knee extension 5/4  AROM/PROM 4 deg AROM Seated LAQ 19*, supine quad set with heel prop 6*   0 deg AROM 0  Ankle dorsiflexion         Ankle plantarflexion         Ankle inversion         Ankle eversion          (Blank rows = not tested)   GAIT: Comments: slow gait, step to pattern with FWW.  Decreased Wb'ing through R LE and increased Wb'ing through UE's.                                                                                                                                TREATMENT:   10/13 Manual:  PROM into extension with distraction  Trigger point release to gastroc Review of self mobilization  Extension ROM with distraction  Trigger point release to posterior knee  Trigger point release to the quad  Edema massage    SAQ 3x10  2.5# LAQ  3x10  2.5# Seated HS curls with RTB 3x10  Step ups 2x10  6 inch Lateral step ups 2x10  6 inch Step down  4 inch 2x10  10/10 Bike x 5 mins Supine heel slides with strap x 10  reps  Pt received sup/inf patellar mobs f/b R knee flexion and extension PROM in supine.  Pt received R knee manual extension stretch with heel propped on half roll.   PT assessed ROM.  See above.   SAQ 3x10  2.5# LAQ  3x10  2.5# Seated HS curls with RTB 3x10  Step ups 3x10  6 inch Lateral step ups 2x10  6 inch Step down  4  inch 2x10  LEFS:  44/80   10/8 Bike x 5 mins SAQ 3x10  2.5# LAQ  3x10  2.5# Seated HS curls with RTB 3x10 Supine heel slides with strap x 10 reps  Pt received R knee flexion and extension PROM in supine.  Pt received R knee manual extension stretch with heel propped on half roll.    Step ups 2x10  6 inch Lateral step ups 2x10  6 inch Step down  4 inch 2x10   10/3 Manual:  PROM into extension with distraction  Trigger point release to gastroc Review of self mobilization  Extension ROM with distraction  Trigger point release to posterior knee  Trigger point release to the quad  Edema massage   There-ex:  Quad sets x15  SLR 3x10  LAQ 3x10 2.5 lbs  SAQ 2.5 lbs 3x10   There-act:  Step down 2x10 2 inch  Step up 2x10 4 inch  Lateral step 2x10 4 inch          PATIENT EDUCATION:  Education details:  post op protocol restrictions and expectations, exercise form, sx response and management, HEP, dx, prognosis, gait, and POC.  PT answered pt's questions.  Person educated: Patient Education method: Explanation, Demonstration, Tactile cues, Verbal cues Education comprehension: verbalized understanding, returned demonstration, verbal cues required, and tactile cues required  HOME EXERCISE PROGRAM: Exercises - Supine Quadricep Sets  - 2 x daily - 7 x weekly - 2 sets - 10 reps - 5 seconds hold - ANKLE PUMPS  - 7 x weekly - Seated Passive Knee Extension  - 2-3 x daily - 7 x weekly - 2 reps - 2 minutes hold -Sitting with knee flexed for 3-5 mins comfortably at various times for 6-7 times/day  Access Code: [email or web address] URL: [email or web address] Date: 12/28/2023 Prepared by: Mose Minerva  Exercises - Supine Gluteal Sets  - 2 x daily - 7 x weekly - 2 sets - 10 reps - 5 seconds hold - Supine Heel Slide  - 2 - 3 x daily - 7 x weekly - 1 sets - 10 reps - Supine Short Arc Quad  - 1 x daily - 7 x weekly - 2 sets - 10 reps - Supine Active Straight Leg Raise  - 2  x daily - 7 x weekly - 1 sets - 5 reps - Supine Heel Slide with Strap  - 2 x daily - 7 x weekly - 2 sets - 10 reps  ASSESSMENT:  CLINICAL IMPRESSION: The patient continues to make great progress. She has bene walking more without a device. Her range is approaching end range We continue to work on functional exercises such as stairs. They an be irritating but they are improving.   OBJECTIVE IMPAIRMENTS: Abnormal gait, decreased activity tolerance, decreased endurance, decreased mobility, difficulty walking, decreased ROM, decreased strength, hypomobility, increased edema, impaired flexibility, and pain.   ACTIVITY LIMITATIONS: bending, standing, squatting, stairs, transfers, bed mobility, dressing, and locomotion level  PARTICIPATION LIMITATIONS: meal prep, cleaning, laundry, driving, shopping, and community activity  PERSONAL FACTORS: 1-2 comorbidities: L knee arthritis,  A-fib are also affecting patient's functional outcome.   REHAB POTENTIAL: Good  CLINICAL DECISION MAKING: Stable/uncomplicated  EVALUATION COMPLEXITY: Low   GOALS:   SHORT TERM GOALS:  Pt will be independent and compliant with HEP for improved pain, ROM, strength, and function.  Baseline: Goal status: MET 01/15/24 Target date:     2.  Pt will demo a good quad set and be able to perform a supine SLR independently for improved strength and stability with gait. Baseline:  Goal status: ONGOING 01/15/24 Target date:  01/25/24   3.  Pt will demo improved R knee AROM to 0 - 95 deg for improved mobility, stiffness, and gait.   Baseline:  Goal status: GOAL MET  10/10 Target date:  01/25/24  4.  Pt will demo improved quality of gait and ambulate with a SPC with good stability.  Baseline:  Goal status: GOAL MET  10/10 Target date:  01/25/24  5.  Pt will able to perform her normal daily transfers with no > than minimal difficulty.   Baseline:  Goal status: GOAL MET  10/10 Target date: 02/22/2024    LONG TERM  GOALS: Target date: 03/18/24  Pt will demo improved R knee AROM to 0 - 115 deg for improved mobility and stiffness and for improved performance of stairs.  Baseline:  Goal status: GOAL MET  10/10  2.  Pt will ambulate with a normalized heel to toe gait without limping.  Baseline:  Goal status:  PROGRESSING  3.  Pt will ambulate community distance without an AD without significant pain and difficulty.  Baseline:  Goal status: PROGRESSING  4.  Pt will be able to perform household chores without significant pain and difficulty.   Baseline:  Goal status: ONGOING  5.  Pt will be able to perform 4-5 steps with a reciprocal gait with the rail for improved performance of stairs.    Baseline:  Goal status:  INITIAL  6.  Pt will demo improved strength to 4+/5 in R hip abd, 4+ to 5/5 in Hip flexion, and 5/5 in knee ext and flexion for improved performance of daily functional mobility and to assist with returning to recreational activities.  Baseline:  Goal status: INITIAL   PLAN:  PT FREQUENCY: 2-3x/wk x 3 weeks and 2x/wk afterwards  PT DURATION: 10 weeks  PLANNED INTERVENTIONS: 97164- PT Re-evaluation, 97750- Physical Performance Testing, 97110-Therapeutic exercises, 97530- Therapeutic activity, 97112- Neuromuscular re-education, 97535- Self Care, 02859- Manual therapy, [phone]- Gait training, [phone]- Aquatic Therapy, Patient/Family education, Balance training, Stair training, Taping, Joint mobilization, Spinal mobilization, Scar mobilization, DME instructions, Cryotherapy, and Moist heat  PLAN FOR NEXT SESSION: Cont per TKA protocol.  Review and perform HEP, progressions PRN.  Knee ROM.  Gait training. Manual as desired/tolerated.   Alm Don PT DPT 02/23/24 8:50 AM

## 2024-02-23 ENCOUNTER — Encounter (HOSPITAL_BASED_OUTPATIENT_CLINIC_OR_DEPARTMENT_OTHER): Payer: Self-pay | Admitting: Physical Therapy

## 2024-02-24 ENCOUNTER — Encounter (HOSPITAL_BASED_OUTPATIENT_CLINIC_OR_DEPARTMENT_OTHER): Payer: Self-pay | Admitting: Physical Therapy

## 2024-02-24 ENCOUNTER — Ambulatory Visit (HOSPITAL_BASED_OUTPATIENT_CLINIC_OR_DEPARTMENT_OTHER): Admitting: Physical Therapy

## 2024-02-24 DIAGNOSIS — M6281 Muscle weakness (generalized): Secondary | ICD-10-CM

## 2024-02-24 DIAGNOSIS — R262 Difficulty in walking, not elsewhere classified: Secondary | ICD-10-CM

## 2024-02-24 DIAGNOSIS — M25661 Stiffness of right knee, not elsewhere classified: Secondary | ICD-10-CM | POA: Diagnosis not present

## 2024-02-24 DIAGNOSIS — M25561 Pain in right knee: Secondary | ICD-10-CM | POA: Diagnosis not present

## 2024-02-24 NOTE — Therapy (Signed)
 OUTPATIENT PHYSICAL THERAPY LOWER EXTREMITY TREATMENT        Patient Name: TAEGAN HAIDER MRN: [id number] DOB:01/23/1956, 68 y.o., female Today's Date: 02/24/2024  END OF SESSION:  PT End of Session - 02/24/24 1309     Visit Number 22    Number of Visits 28    Date for Recertification  03/18/24    Authorization Type BCBS MCR progress note at visit 30    Progress Note Due on Visit 30    PT Start Time 1301    PT Stop Time 1340    PT Time Calculation (min) 39 min    Activity Tolerance Patient tolerated treatment well    Behavior During Therapy WFL for tasks assessed/performed                       Past Medical History:  Diagnosis Date   Anxiety    Arthritis    Diverticulitis    Dyspnea    walking up hill   Dysrhythmia    PAF   Hypertension    Meniere disease of right ear    Pneumonia    Pre-diabetes    Past Surgical History:  Procedure Laterality Date   ABDOMINAL HYSTERECTOMY     APPENDECTOMY     COLON SURGERY  2022   Colectomy for colovaginal fistula & diverticulitis   CYST EXCISION Right 11/02/2015   Procedure: RIGHT INDEX EXCISION MASS ;  Surgeon: Franky Curia, MD;  Location: [facility];  Service: Orthopedics;  Laterality: Right;   CYSTOSCOPY Bilateral 11/09/2019   Procedure: CYSTOSCOPY WITH BILATERAL FIREFLY  INJECTION;  Surgeon: Devere Lonni Righter, MD;  Location: [facility];  Service: Urology;  Laterality: Bilateral;   DIAGNOSTIC LAPAROSCOPY     GYN   DILATION AND CURETTAGE OF UTERUS     TONSILLECTOMY     TOTAL KNEE ARTHROPLASTY Right 12/25/2023   Procedure: ARTHROPLASTY, KNEE, TOTAL;  Surgeon: Kay Kemps, MD;  Location: [facility];  Service: Orthopedics;  Laterality: Right;   URETHRAL DILATION     WISDOM TOOTH EXTRACTION     Patient Active Problem List   Diagnosis Date Noted   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   Paroxysmal atrial fibrillation (HCC) 02/17/2023   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   Enterovaginal  fistula 10/27/2019   Hypocalcemia 10/27/2019   Sigmoid diverticulitis 10/26/2019   Chest pain 06/06/2015   GERD (gastroesophageal reflux disease) 06/06/2015   Costochondritis 06/06/2015     REFERRING PROVIDER: Kay Kemps, MD   REFERRING DIAG: M17.11 (ICD-10-CM) - Unilateral primary osteoarthritis, right knee  S/p R TKA   THERAPY DIAG:  Right knee pain, unspecified chronicity  Stiffness of right knee, not elsewhere classified  Muscle weakness (generalized)  Difficulty in walking, not elsewhere classified  Rationale for Evaluation and Treatment: Rehabilitation  ONSET DATE: DOS  12/25/23  SUBJECTIVE:   SUBJECTIVE STATEMENT:    Knee is hurting me a bit today, was up on my feet a lot today and maybe that's why. Overall its doing a lot better but had to take a muscle relaxer earlier.    PERTINENT HISTORY: R TKA  12/25/23 Arthritis including L knee, A-fib, anxiety, HTN   Pt reports having a fx in R hip 20 years ago.  PAIN:  NPRS:  7/10 Location:  R knee Type of pain: whole knee   Nothing makes it better, unsure what makes it worse   PRECAUTIONS: Other: per surgical protocol for TKA    WEIGHT BEARING RESTRICTIONS: none  indicated  FALLS:  Has patient fallen in last 6 months? No  LIVING ENVIRONMENT: Lives with: lives with their spouse Lives in: 1 story home Stairs: small step to enter/exit home without rail Has following equipment at home: Single point cane, Walker - 2 wheeled, and Crutches, bedside toilet, shower stool  OCCUPATION: Pt is retired.   PLOF: Independent.  Pt ambulating without an AD.   PATIENT GOALS:  Pt wants to be able to do everything.  Be able to garden.  She wants to be able to get up and down with ease and play with her grandson.     NEXT MD VISIT: 01/07/2024  OBJECTIVE:  Note: Objective measures were completed at Evaluation unless otherwise noted.  DIAGNOSTIC FINDINGS: Pt is post op.   OBSERVATION:  Pt wearing bilat compression  socks.  Aquacel bandage has been removed.  Incision intact and dry.  No signs of infection.     LOWER EXTREMITY ROM:   ROM Right eval Right 8/29 Right 01/15/24 09/08 Right  09/10 10/10 Right 10/14 Right   Hip flexion         Hip extension         Hip abduction         Hip adduction         Hip internal rotation         Hip external rotation         Knee flexion 60 deg PROM 98 deg PROM Seated AROM 100* 107 Passive  110 120 AROM 118  Knee extension 5/4  AROM/PROM 4 deg AROM Seated LAQ 19*, supine quad set with heel prop 6*   0 deg AROM 0  Ankle dorsiflexion         Ankle plantarflexion         Ankle inversion         Ankle eversion          (Blank rows = not tested)   GAIT: Comments: slow gait, step to pattern with FWW.  Decreased Wb'ing through R LE and increased Wb'ing through UE's.                                                                                                                                TREATMENT:    02/24/24  Scifit L3 seat 12 x8 minutes for ROM and gentle warm up Shuttle leg press BLEs 75# 2x12  Shuttle LE press single leg  R 37# 2x12  Bridges + ABD into green TB 2x12   SAQ 3# 2x12 R LAQs 3# 2x12 R Prone HS curls 3# 2x12 R  Percussion gun R quad    10/13 Manual:  PROM into extension with distraction  Trigger point release to gastroc Review of self mobilization  Extension ROM with distraction  Trigger point release to posterior knee  Trigger point release to the quad  Edema massage    SAQ 3x10  2.5# LAQ  3x10  2.5# Seated HS curls with RTB 3x10  Step ups 2x10  6 inch Lateral step ups 2x10  6 inch Step down  4 inch 2x10  10/10 Bike x 5 mins Supine heel slides with strap x 10 reps  Pt received sup/inf patellar mobs f/b R knee flexion and extension PROM in supine.  Pt received R knee manual extension stretch with heel propped on half roll.   PT assessed ROM.  See above.   SAQ 3x10  2.5# LAQ  3x10  2.5# Seated HS curls with  RTB 3x10  Step ups 3x10  6 inch Lateral step ups 2x10  6 inch Step down  4 inch 2x10  LEFS:  44/80    PATIENT EDUCATION:  Education details:  post op protocol restrictions and expectations, exercise form, sx response and management, HEP, dx, prognosis, gait, and POC.  PT answered pt's questions.  Person educated: Patient Education method: Explanation, Demonstration, Tactile cues, Verbal cues Education comprehension: verbalized understanding, returned demonstration, verbal cues required, and tactile cues required  HOME EXERCISE PROGRAM: Exercises - Supine Quadricep Sets  - 2 x daily - 7 x weekly - 2 sets - 10 reps - 5 seconds hold - ANKLE PUMPS  - 7 x weekly - Seated Passive Knee Extension  - 2-3 x daily - 7 x weekly - 2 reps - 2 minutes hold -Sitting with knee flexed for 3-5 mins comfortably at various times for 6-7 times/day  Access Code: [email or web address] URL: [email or web address] Date: 12/28/2023 Prepared by: Mose Minerva  Exercises - Supine Gluteal Sets  - 2 x daily - 7 x weekly - 2 sets - 10 reps - 5 seconds hold - Supine Heel Slide  - 2 - 3 x daily - 7 x weekly - 1 sets - 10 reps - Supine Short Arc Quad  - 1 x daily - 7 x weekly - 2 sets - 10 reps - Supine Active Straight Leg Raise  - 2 x daily - 7 x weekly - 1 sets - 5 reps - Supine Heel Slide with Strap  - 2 x daily - 7 x weekly - 2 sets - 10 reps  ASSESSMENT:  CLINICAL IMPRESSION:  Arrived today having more pain than her usual, we took it more easy today with focus on ROM and gentle strengthening due to increased discomfort today. Doing well overall, fully expect that she will be feeling much better next visit. Going to a wedding this weekend, encouraged dress flats rather than heels.     OBJECTIVE IMPAIRMENTS: Abnormal gait, decreased activity tolerance, decreased endurance, decreased mobility, difficulty walking, decreased ROM, decreased strength, hypomobility, increased edema, impaired flexibility, and  pain.   ACTIVITY LIMITATIONS: bending, standing, squatting, stairs, transfers, bed mobility, dressing, and locomotion level  PARTICIPATION LIMITATIONS: meal prep, cleaning, laundry, driving, shopping, and community activity  PERSONAL FACTORS: 1-2 comorbidities: L knee arthritis, A-fib are also affecting patient's functional outcome.   REHAB POTENTIAL: Good  CLINICAL DECISION MAKING: Stable/uncomplicated  EVALUATION COMPLEXITY: Low   GOALS:   SHORT TERM GOALS:  Pt will be independent and compliant with HEP for improved pain, ROM, strength, and function.  Baseline: Goal status: MET 01/15/24 Target date:     2.  Pt will demo a good quad set and be able to perform a supine SLR independently for improved strength and stability with gait. Baseline:  Goal status: ONGOING 01/15/24 Target date:  01/25/24   3.  Pt will demo improved R knee AROM to 0 - 95 deg for improved  mobility, stiffness, and gait.   Baseline:  Goal status: GOAL MET  10/10 Target date:  01/25/24  4.  Pt will demo improved quality of gait and ambulate with a SPC with good stability.  Baseline:  Goal status: GOAL MET  10/10 Target date:  01/25/24  5.  Pt will able to perform her normal daily transfers with no > than minimal difficulty.   Baseline:  Goal status: GOAL MET  10/10 Target date: 02/22/2024    LONG TERM GOALS: Target date: 03/18/24  Pt will demo improved R knee AROM to 0 - 115 deg for improved mobility and stiffness and for improved performance of stairs.  Baseline:  Goal status: GOAL MET  10/10  2.  Pt will ambulate with a normalized heel to toe gait without limping.  Baseline:  Goal status:  PROGRESSING  3.  Pt will ambulate community distance without an AD without significant pain and difficulty.  Baseline:  Goal status: PROGRESSING  4.  Pt will be able to perform household chores without significant pain and difficulty.   Baseline:  Goal status: ONGOING  5.  Pt will be able to perform 4-5  steps with a reciprocal gait with the rail for improved performance of stairs.    Baseline:  Goal status:  INITIAL  6.  Pt will demo improved strength to 4+/5 in R hip abd, 4+ to 5/5 in Hip flexion, and 5/5 in knee ext and flexion for improved performance of daily functional mobility and to assist with returning to recreational activities.  Baseline:  Goal status: INITIAL   PLAN:  PT FREQUENCY: 2-3x/wk x 3 weeks and 2x/wk afterwards  PT DURATION: 10 weeks  PLANNED INTERVENTIONS: 97164- PT Re-evaluation, 97750- Physical Performance Testing, 97110-Therapeutic exercises, 97530- Therapeutic activity, 97112- Neuromuscular re-education, 97535- Self Care, 02859- Manual therapy, [phone]- Gait training, [phone]- Aquatic Therapy, Patient/Family education, Balance training, Stair training, Taping, Joint mobilization, Spinal mobilization, Scar mobilization, DME instructions, Cryotherapy, and Moist heat  PLAN FOR NEXT SESSION: Cont per TKA protocol.  Review and perform HEP, progressions PRN.  Knee ROM.  Gait training. Manual as appropriate/desired   Josette Rough, PT, DPT 02/24/24 1:41 PM

## 2024-03-01 DIAGNOSIS — B002 Herpesviral gingivostomatitis and pharyngotonsillitis: Secondary | ICD-10-CM | POA: Diagnosis not present

## 2024-03-01 DIAGNOSIS — F430 Acute stress reaction: Secondary | ICD-10-CM | POA: Diagnosis not present

## 2024-03-02 ENCOUNTER — Ambulatory Visit (HOSPITAL_BASED_OUTPATIENT_CLINIC_OR_DEPARTMENT_OTHER): Admitting: Physical Therapy

## 2024-03-02 ENCOUNTER — Ambulatory Visit: Attending: Cardiology

## 2024-03-02 DIAGNOSIS — E785 Hyperlipidemia, unspecified: Secondary | ICD-10-CM

## 2024-03-02 DIAGNOSIS — M25561 Pain in right knee: Secondary | ICD-10-CM

## 2024-03-02 DIAGNOSIS — R262 Difficulty in walking, not elsewhere classified: Secondary | ICD-10-CM

## 2024-03-02 DIAGNOSIS — M25661 Stiffness of right knee, not elsewhere classified: Secondary | ICD-10-CM

## 2024-03-02 DIAGNOSIS — M6281 Muscle weakness (generalized): Secondary | ICD-10-CM

## 2024-03-02 NOTE — Therapy (Unsigned)
 OUTPATIENT PHYSICAL THERAPY LOWER EXTREMITY TREATMENT        Patient Name: Dawn Haley MRN: [id number] DOB:07-10-1955, 68 y.o., female Today's Date: 03/03/2024  END OF SESSION:  PT End of Session - 03/02/24 1412     Visit Number 23    Number of Visits 28    Date for Recertification  03/18/24    Authorization Type BCBS MCR progress note at visit 30    PT Start Time 1327    PT Stop Time 1412    PT Time Calculation (min) 45 min    Activity Tolerance Patient tolerated treatment well    Behavior During Therapy WFL for tasks assessed/performed                        Past Medical History:  Diagnosis Date   Anxiety    Arthritis    Diverticulitis    Dyspnea    walking up hill   Dysrhythmia    PAF   Hypertension    Meniere disease of right ear    Pneumonia    Pre-diabetes    Past Surgical History:  Procedure Laterality Date   ABDOMINAL HYSTERECTOMY     APPENDECTOMY     COLON SURGERY  2022   Colectomy for colovaginal fistula & diverticulitis   CYST EXCISION Right 11/02/2015   Procedure: RIGHT INDEX EXCISION MASS ;  Surgeon: Franky Curia, MD;  Location: [facility];  Service: Orthopedics;  Laterality: Right;   CYSTOSCOPY Bilateral 11/09/2019   Procedure: CYSTOSCOPY WITH BILATERAL FIREFLY  INJECTION;  Surgeon: Devere Lonni Righter, MD;  Location: [facility];  Service: Urology;  Laterality: Bilateral;   DIAGNOSTIC LAPAROSCOPY     GYN   DILATION AND CURETTAGE OF UTERUS     TONSILLECTOMY     TOTAL KNEE ARTHROPLASTY Right 12/25/2023   Procedure: ARTHROPLASTY, KNEE, TOTAL;  Surgeon: Kay Kemps, MD;  Location: [facility];  Service: Orthopedics;  Laterality: Right;   URETHRAL DILATION     WISDOM TOOTH EXTRACTION     Patient Active Problem List   Diagnosis Date Noted   Hyperlipidemia 03/02/2024   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   Paroxysmal atrial fibrillation (HCC) 02/17/2023   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   Enterovaginal  fistula 10/27/2019   Hypocalcemia 10/27/2019   Sigmoid diverticulitis 10/26/2019   Chest pain 06/06/2015   GERD (gastroesophageal reflux disease) 06/06/2015   Costochondritis 06/06/2015     REFERRING PROVIDER: Kay Kemps, MD   REFERRING DIAG: M17.11 (ICD-10-CM) - Unilateral primary osteoarthritis, right knee  S/p R TKA   THERAPY DIAG:  Right knee pain, unspecified chronicity  Stiffness of right knee, not elsewhere classified  Muscle weakness (generalized)  Difficulty in walking, not elsewhere classified  Rationale for Evaluation and Treatment: Rehabilitation  ONSET DATE: DOS  12/25/23  SUBJECTIVE:   SUBJECTIVE STATEMENT:  Pt is 9 weeks and 5 days s/p R TKA. Pt had to walk up/down 12 stone steps at a wedding and had to be very careful.  Her knee has been more sore since the weekend.  Pt states she has more spasms and tightness than pain.      PERTINENT HISTORY: R TKA  12/25/23 Arthritis including L knee, A-fib, anxiety, HTN   Pt reports having a fx in R hip 20 years ago.  PAIN:  NPRS:  7/10 Location:  R knee Type of pain: whole knee   Nothing makes it better, unsure what makes it worse   PRECAUTIONS: Other: per  surgical protocol for TKA    WEIGHT BEARING RESTRICTIONS: none indicated  FALLS:  Has patient fallen in last 6 months? No  LIVING ENVIRONMENT: Lives with: lives with their spouse Lives in: 1 story home Stairs: small step to enter/exit home without rail Has following equipment at home: Single point cane, Walker - 2 wheeled, and Crutches, bedside toilet, shower stool  OCCUPATION: Pt is retired.   PLOF: Independent.  Pt ambulating without an AD.   PATIENT GOALS:  Pt wants to be able to do everything.  Be able to garden.  She wants to be able to get up and down with ease and play with her grandson.     NEXT MD VISIT: 01/07/2024  OBJECTIVE:  Note: Objective measures were completed at Evaluation unless otherwise noted.  DIAGNOSTIC FINDINGS: Pt  is post op.   OBSERVATION:  Pt wearing bilat compression socks.  Aquacel bandage has been removed.  Incision intact and dry.  No signs of infection.     LOWER EXTREMITY ROM:   ROM Right eval Right 8/29 Right 01/15/24 09/08 Right  09/10 10/10 Right 10/14 Right   Hip flexion         Hip extension         Hip abduction         Hip adduction         Hip internal rotation         Hip external rotation         Knee flexion 60 deg PROM 98 deg PROM Seated AROM 100* 107 Passive  110 120 AROM 118  Knee extension 5/4  AROM/PROM 4 deg AROM Seated LAQ 19*, supine quad set with heel prop 6*   0 deg AROM 0  Ankle dorsiflexion         Ankle plantarflexion         Ankle inversion         Ankle eversion          (Blank rows = not tested)   GAIT: Comments: slow gait, step to pattern with FWW.  Decreased Wb'ing through R LE and increased Wb'ing through UE's.                                                                                                                                TREATMENT:    03/02/24 Scifit L3 seat 11 x 5 mins  Pt received R knee flexion and extension PROM in supine.  STM and TPR to R quad and gastroc in supine  LAQ 3# 3x12 Seated HS curl RTB x10, GTB 2x10 Shuttle leg press bilat LE's 3-25's 2x12  Step ups 6 inch step 2x10 Lateral step ups 6 inch Sidestepping Lateral band walks with RTB around thighs 2x10   02/24/24  Scifit L3 seat 12 x8 minutes for ROM and gentle warm up Shuttle leg press BLEs 75# 2x12  Shuttle LE press single leg  R 37# 2x12  Bridges + ABD into green TB 2x12   SAQ 3# 2x12 R LAQs 3# 2x12 R Prone HS curls 3# 2x12 R  Percussion gun R quad    10/13 Manual:  PROM into extension with distraction  Trigger point release to gastroc Review of self mobilization  Extension ROM with distraction  Trigger point release to posterior knee  Trigger point release to the quad  Edema massage    SAQ 3x10  2.5# LAQ  3x10  2.5# Seated HS  curls with RTB 3x10  Step ups 2x10  6 inch Lateral step ups 2x10  6 inch Step down  4 inch 2x10  10/10 Bike x 5 mins Supine heel slides with strap x 10 reps  Pt received sup/inf patellar mobs f/b R knee flexion and extension PROM in supine.  Pt received R knee manual extension stretch with heel propped on half roll.   PT assessed ROM.  See above.   SAQ 3x10  2.5# LAQ  3x10  2.5# Seated HS curls with RTB 3x10  Step ups 3x10  6 inch Lateral step ups 2x10  6 inch Step down  4 inch 2x10  LEFS:  44/80    PATIENT EDUCATION:  Education details:  post op protocol restrictions and expectations, exercise form, sx response and management, HEP, dx, prognosis, gait, and POC.  PT answered pt's questions.  Person educated: Patient Education method: Explanation, Demonstration, Tactile cues, Verbal cues Education comprehension: verbalized understanding, returned demonstration, verbal cues required, and tactile cues required  HOME EXERCISE PROGRAM: Exercises - Supine Quadricep Sets  - 2 x daily - 7 x weekly - 2 sets - 10 reps - 5 seconds hold - ANKLE PUMPS  - 7 x weekly - Seated Passive Knee Extension  - 2-3 x daily - 7 x weekly - 2 reps - 2 minutes hold -Sitting with knee flexed for 3-5 mins comfortably at various times for 6-7 times/day  Access Code: [email or web address] URL: [email or web address] Date: 12/28/2023 Prepared by: Mose Minerva  Exercises - Supine Gluteal Sets  - 2 x daily - 7 x weekly - 2 sets - 10 reps - 5 seconds hold - Supine Heel Slide  - 2 - 3 x daily - 7 x weekly - 1 sets - 10 reps - Supine Short Arc Quad  - 1 x daily - 7 x weekly - 2 sets - 10 reps - Supine Active Straight Leg Raise  - 2 x daily - 7 x weekly - 1 sets - 5 reps - Supine Heel Slide with Strap  - 2 x daily - 7 x weekly - 2 sets - 10 reps  ASSESSMENT:  CLINICAL IMPRESSION:  Pt went to a wedding this past weekend and as a result walked more and performed steps.  Pt reports having spasms and  tightness.  PT performed STM to quad and calf to improve soft tissue tightness and mobility and spasms.  Pt had soft tissue tightness and tenderness with STM to those areas.  Pt had good flexion PROM and she tolerated PROM well.  She performed exercises well and is improving with R LE strength.  Pt tolerated treatment well and will benefit from cont skilled PT per protocol to address impairments and improve overall function.     OBJECTIVE IMPAIRMENTS: Abnormal gait, decreased activity tolerance, decreased endurance, decreased mobility, difficulty walking, decreased ROM, decreased strength, hypomobility, increased edema, impaired flexibility, and pain.   ACTIVITY LIMITATIONS: bending, standing, squatting, stairs, transfers, bed mobility, dressing, and locomotion level  PARTICIPATION  LIMITATIONS: meal prep, cleaning, laundry, driving, shopping, and community activity  PERSONAL FACTORS: 1-2 comorbidities: L knee arthritis, A-fib are also affecting patient's functional outcome.   REHAB POTENTIAL: Good  CLINICAL DECISION MAKING: Stable/uncomplicated  EVALUATION COMPLEXITY: Low   GOALS:   SHORT TERM GOALS:  Pt will be independent and compliant with HEP for improved pain, ROM, strength, and function.  Baseline: Goal status: MET 01/15/24 Target date:     2.  Pt will demo a good quad set and be able to perform a supine SLR independently for improved strength and stability with gait. Baseline:  Goal status: ONGOING 01/15/24 Target date:  01/25/24   3.  Pt will demo improved R knee AROM to 0 - 95 deg for improved mobility, stiffness, and gait.   Baseline:  Goal status: GOAL MET  10/10 Target date:  01/25/24  4.  Pt will demo improved quality of gait and ambulate with a SPC with good stability.  Baseline:  Goal status: GOAL MET  10/10 Target date:  01/25/24  5.  Pt will able to perform her normal daily transfers with no > than minimal difficulty.   Baseline:  Goal status: GOAL MET   10/10 Target date: 02/22/2024    LONG TERM GOALS: Target date: 03/18/24  Pt will demo improved R knee AROM to 0 - 115 deg for improved mobility and stiffness and for improved performance of stairs.  Baseline:  Goal status: GOAL MET  10/10  2.  Pt will ambulate with a normalized heel to toe gait without limping.  Baseline:  Goal status:  PROGRESSING  3.  Pt will ambulate community distance without an AD without significant pain and difficulty.  Baseline:  Goal status: PROGRESSING  4.  Pt will be able to perform household chores without significant pain and difficulty.   Baseline:  Goal status: ONGOING  5.  Pt will be able to perform 4-5 steps with a reciprocal gait with the rail for improved performance of stairs.    Baseline:  Goal status:  INITIAL  6.  Pt will demo improved strength to 4+/5 in R hip abd, 4+ to 5/5 in Hip flexion, and 5/5 in knee ext and flexion for improved performance of daily functional mobility and to assist with returning to recreational activities.  Baseline:  Goal status: INITIAL   PLAN:  PT FREQUENCY: 2-3x/wk x 3 weeks and 2x/wk afterwards  PT DURATION: 10 weeks  PLANNED INTERVENTIONS: 97164- PT Re-evaluation, 97750- Physical Performance Testing, 97110-Therapeutic exercises, 97530- Therapeutic activity, 97112- Neuromuscular re-education, 97535- Self Care, 02859- Manual therapy, [phone]- Gait training, [phone]- Aquatic Therapy, Patient/Family education, Balance training, Stair training, Taping, Joint mobilization, Spinal mobilization, Scar mobilization, DME instructions, Cryotherapy, and Moist heat  PLAN FOR NEXT SESSION: Cont per TKA protocol.  Review and perform HEP, progressions PRN.  Knee ROM.  Gait training. Manual as appropriate/desired   Leigh Minerva III PT, DPT 03/03/24 10:59 PM

## 2024-03-02 NOTE — Patient Instructions (Addendum)
 Your Results:             Your most recent labs Goal  Total Cholesterol 270 < 200  Triglycerides 125 < 150  HDL (happy/good cholesterol) 54 > 40  LDL (lousy/bad cholesterol 193 < 100   Medication changes:   Praluent is a cholesterol medication that improved your body's ability to get rid of bad cholesterol known as LDL. It can lower your LDL up to 60%. It is an injection that is given under the skin every 2 weeks. The most common side effects of Praluent include runny nose, symptoms of the common cold, rarely flu or flu-like symptoms, back/muscle pain in about 3-4% of the patients, and redness, pain, or bruising at the injection site.  Repatha is a cholesterol medication that improved your body's ability to get rid of bad cholesterol known as LDL. It can lower your LDL up to 60%! It is an injection that is given under the skin every 2 weeks. The medication often requires a prior authorization from your insurance company. We will take care of submitting all the necessary information to your insurance company to get it approved. The most common side effects of Repatha include runny nose, symptoms of the common cold, rarely flu or flu-like symptoms, back/muscle pain in about 3-4% of the patients, and redness, pain, or bruising at the injection site.  Inclisiran Garr): injection to obtain at infusion center  Ezetimibe (Zetia) - oral daily tablet Bempedoic acid (Nexletol) -  oral daily tablet   Lab orders: We want to repeat labs after 3 months.  We will send you a lab order to remind you once we get closer to that time.       Conard Alvira E. Augustin Bun, Pharm.D [place] [facility]. [facility] [address], [place], [place] [address] Phone: [phone]; Fax: [phone]    [facility] locations: [facility] - [address] First floor - [address] ([facility])  - [address] [phone]    Engineer, manufacturing systems office - [address] [facility] - [address]   [place]  -[address] Third floor ([facility]) [phone] [address]    [place] - [address]  - [address] [facility]  - [address] [place] - [address] ([facility])

## 2024-03-02 NOTE — Progress Notes (Signed)
 Patient ID: Dawn Haley                 DOB: January 28, 1956                    MRN: [id number]      HPI: Dawn Haley is a 68 y.o. female patient referred to lipid clinic by Dr. Jeffrie. PMH is significant for HLD, atrial fibrillation, HTN, prediabetes, anxiety and hx of total knee arthroplasty (12/2023).   The patient presents today for PharmD lipid clinic. She has a history of statin intolerance, reporting muscle aches and pain with previous use, and is currently not on any therapy for HLD. Given her intolerance, she is not willing to retry statins at this time and is interested in exploring alternative treatment options. Her most recent lipid panel revealed LDL of 193 mg/dL. She reports family hx of heart disease, father (stroke at 62).   She is s/p total knee arthroplasty in August 2025 and current attends PT twice weekly, which includes resistance training with bands, weight leg lifts and step exercises to improve knee mobility. Although she uses cane, she often forgets to use it. Regarding her diet she reports rarely eating out and typically consumes chicken or mal[place] sandwiches with potato chips. She enjoys cookies and admits it's a weakness. The patient expressed interest in a referral to a dietitian, noting that she has always wanted nutritional counseling but has always had a lot going on.   Reviewed options for lowering LDL cholesterol, including ezetimibe, PCSK-9 inhibitors, bempedoic acid and inclisiran.  Discussed mechanisms of action, dosing, side effects and potential decreases in LDL cholesterol.  Also reviewed cost information and potential options for patient assistance.   Current Medications: none  Intolerances: atorvastatin (muscle cramps/weakness in legs), rosuvastatin (leg cramps), pravastatin (leg cramps) Risk Factors: family hx of heart disease (father, stroke at 71), primary hypercholesterolemia (LDL 193),  LDL goal: < 100 Lipid panel (02/2024): Chol 270, Trig 125, HDL 54,  LDL 193 Calcium  score (07/2023): score of 0  Lpa: will obtain today   Diet:  Breakfast: Coffee with almond milk creamer, stevia, and coconut, english muffin, or smoothie with protein powder, fruits and spinach Lunch: [place] sandwich on sourdough bread with lettuce, potato chips Dinner: Salmon with broccoli, rice or tomato based soup with vegetables Drinks: Water  5-6 glasses per day  Exercise:  PT twice weekly: resistance training with bands, weight leg lifts and step exercises  Family History:  Relation Problem Comments  Mother (Deceased) Alzheimer's disease     Father (Deceased) Diabetes   Heart disease    Stroke at age (44)     Sister Metallurgist) Diverticulitis     Sister Metallurgist) Diverticulitis     Brother (Alive)   Maternal Grandmother (Deceased)   Maternal Grandfather (Deceased)   Paternal Grandmother (Deceased)   Paternal Grandfather (Deceased)     Social History:  Alcohol: 4 glasses of wine per week Smoking: none  Labs:  Lipid Panel  No results found for: CHOL, TRIG, HDL, CHOLHDL, VLDL, LDLCALC, LDLDIRECT, LABVLDL  Past Medical History:  Diagnosis Date   Anxiety    Arthritis    Diverticulitis    Dyspnea    walking up hill   Dysrhythmia    PAF   Hypertension    Meniere disease of right ear    Pneumonia    Pre-diabetes     Current Outpatient Medications on File Prior to Visit  Medication Sig Dispense Refill   ASHWAGANDHA  PO Take 800 mg by mouth at bedtime.     Black Pepper-Turmeric (TURMERIC PLUS BLACK PEPPER EXT PO) Take 1 tablet by mouth daily. With Curcuminoids     chlorthalidone  (HYGROTON ) 25 MG tablet Take 25 mg by mouth every 3 (three) days.     GARLIC PO Take 1 capsule by mouth daily.     LORazepam  (ATIVAN ) 0.5 MG tablet Take 0.5 mg by mouth at bedtime as needed for sleep.   0   meclizine  (ANTIVERT ) 25 MG tablet Take 1 tablet (25 mg total) by mouth 3 (three) times daily as needed for dizziness. 15 tablet 0   meloxicam (MOBIC) 15  MG tablet Take 15 mg by mouth daily.     methocarbamol  (ROBAXIN ) 500 MG tablet Take 1 tablet (500 mg total) by mouth every 8 (eight) hours as needed for muscle spasms. 60 tablet 1   metoprolol  tartrate (LOPRESSOR ) 25 MG tablet Take 1 tablet (25 mg total) by mouth 2 (two) times daily as needed. 30 tablet 0   Misc Natural Products (GLUCOSAMINE CHOND MSM FORMULA) TABS Take 1 capsule by mouth daily.     Multiple Vitamin (MULTIVITAMIN WITH MINERALS) TABS tablet Take 1 tablet by mouth daily.     NON FORMULARY Take 1 Scoop by mouth daily. Multi Peptide Collagen with Vitamin C, Biotin, and Hydroloric acid     Omega-3 Fatty Acids (FISH OIL) 1200 MG CAPS Take 1,200 mg by mouth in the morning and at bedtime.     ondansetron  (ZOFRAN ) 4 MG tablet Take 1 tablet (4 mg total) by mouth every 8 (eight) hours as needed for nausea, vomiting or refractory nausea / vomiting. 30 tablet 1   potassium chloride  (KLOR-CON ) 10 MEQ tablet Take 10 mEq by mouth 2 (two) times a week.     Probiotic Product (PROBIOTIC PO) Take 1 capsule by mouth daily.      Ubiquinol 100 MG CAPS Take 100 mg by mouth every 3 (three) days.     VITAMIN D -VITAMIN K  PO Take 1 tablet by mouth daily.     No current facility-administered medications on file prior to visit.    Allergies  Allergen Reactions   Codeine Hives, Rash and Other (See Comments)    Intensifies pain instead of relieving   Adhesive [Tape] Rash   Oxycodone  Other (See Comments)    Pain goes up and blood pressure bottoms out   Other Other (See Comments)    Certain cholesterol medications give her cramps, she can't remember which ones   Statins    Pentothal [Thiopental] Rash    Assessment/Plan:  1. Hyperlipidemia -  Problem  Hyperlipidemia   Hyperlipidemia Assessment:  LDL goal: < 100 mg/dl; last LDLc 806 mg/dl (89/7974) Intolerance to atorvastatin, rosuvastatin, and pravastatin; Patient not willing to retry statins Patient expresses interest in Lpa lab given her  family hx of heart disease and most recent LDL  Discussed next potential options (PCSK-9 inhibitors, bempedoic acid and inclisiran); cost, dosing efficacy, side effects  Discussed and provided handout outlining heart healthy dietary options; Patient interested in nutritional counseling  Plan: Patient will review the alternatives therapies discussed and plans to call and provide a decision within the next few days Will provide nutrition counseling resources at follow up phone call Lipoprotein (a) will be obtained today Follow up lipid panel ~ 3 months after initiating cholesterol lowering therapy  Thank you,  Markesia Crilly E. Eliav Mechling, Pharm.D [place] [facility]. [facility] [address],  [place], [place] [address] Phone: [phone]; Fax: [phone]

## 2024-03-02 NOTE — Assessment & Plan Note (Addendum)
 Assessment:  LDL goal: < 100 mg/dl; last LDLc 806 mg/dl (89/7974) Intolerance to atorvastatin, rosuvastatin, and pravastatin; Patient not willing to retry statins Patient expresses interest in Lpa lab given her family hx of heart disease and most recent LDL  Discussed next potential options (PCSK-9 inhibitors, bempedoic acid and inclisiran); cost, dosing efficacy, side effects  Discussed and provided handout outlining heart healthy dietary options; Patient interested in nutritional counseling  Plan: Patient will review the alternatives therapies discussed and plans to call and provide a decision within the next few days Will provide nutrition counseling resources at follow up phone call Lipoprotein (a) will be obtained today Follow up lipid panel ~ 3 months after initiating cholesterol lowering therapy

## 2024-03-03 ENCOUNTER — Encounter (HOSPITAL_BASED_OUTPATIENT_CLINIC_OR_DEPARTMENT_OTHER): Payer: Self-pay | Admitting: Physical Therapy

## 2024-03-03 LAB — LIPOPROTEIN A (LPA): Lipoprotein (a): 28.9 nmol/L (ref <75.0)

## 2024-03-04 ENCOUNTER — Ambulatory Visit (HOSPITAL_BASED_OUTPATIENT_CLINIC_OR_DEPARTMENT_OTHER): Admitting: Physical Therapy

## 2024-03-04 ENCOUNTER — Ambulatory Visit: Payer: Self-pay | Admitting: Cardiology

## 2024-03-04 ENCOUNTER — Encounter (HOSPITAL_BASED_OUTPATIENT_CLINIC_OR_DEPARTMENT_OTHER): Payer: Self-pay | Admitting: Physical Therapy

## 2024-03-04 DIAGNOSIS — M25561 Pain in right knee: Secondary | ICD-10-CM

## 2024-03-04 DIAGNOSIS — M25661 Stiffness of right knee, not elsewhere classified: Secondary | ICD-10-CM

## 2024-03-04 DIAGNOSIS — R262 Difficulty in walking, not elsewhere classified: Secondary | ICD-10-CM | POA: Diagnosis not present

## 2024-03-04 DIAGNOSIS — M6281 Muscle weakness (generalized): Secondary | ICD-10-CM

## 2024-03-04 NOTE — Therapy (Signed)
 OUTPATIENT PHYSICAL THERAPY LOWER EXTREMITY TREATMENT        Patient Name: Dawn Haley MRN: [id number] DOB:1956/01/30, 68 y.o., female Today's Date: 03/04/2024  END OF SESSION:  PT End of Session - 03/04/24 1135     Visit Number 24    Number of Visits 28    Date for Recertification  03/18/24    Authorization Type [employer] MCR progress note at visit 30    PT Start Time 1106    PT Stop Time 1157    PT Time Calculation (min) 51 min    Activity Tolerance Patient tolerated treatment well    Behavior During Therapy WFL for tasks assessed/performed                        Past Medical History:  Diagnosis Date   Anxiety    Arthritis    Diverticulitis    Dyspnea    walking up hill   Dysrhythmia    PAF   Hypertension    Meniere disease of right ear    Pneumonia    Pre-diabetes    Past Surgical History:  Procedure Laterality Date   ABDOMINAL HYSTERECTOMY     APPENDECTOMY     COLON SURGERY  2022   Colectomy for colovaginal fistula & diverticulitis   CYST EXCISION Right 11/02/2015   Procedure: RIGHT INDEX EXCISION MASS ;  Surgeon: Franky Curia, MD;  Location: [facility];  Service: Orthopedics;  Laterality: Right;   CYSTOSCOPY Bilateral 11/09/2019   Procedure: CYSTOSCOPY WITH BILATERAL FIREFLY  INJECTION;  Surgeon: Devere Lonni Righter, MD;  Location: [facility];  Service: Urology;  Laterality: Bilateral;   DIAGNOSTIC LAPAROSCOPY     GYN   DILATION AND CURETTAGE OF UTERUS     TONSILLECTOMY     TOTAL KNEE ARTHROPLASTY Right 12/25/2023   Procedure: ARTHROPLASTY, KNEE, TOTAL;  Surgeon: Kay Kemps, MD;  Location: [facility];  Service: Orthopedics;  Laterality: Right;   URETHRAL DILATION     WISDOM TOOTH EXTRACTION     Patient Active Problem List   Diagnosis Date Noted   Hyperlipidemia 03/02/2024   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   Paroxysmal atrial fibrillation (HCC) 02/17/2023   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   Enterovaginal  fistula 10/27/2019   Hypocalcemia 10/27/2019   Sigmoid diverticulitis 10/26/2019   Chest pain 06/06/2015   GERD (gastroesophageal reflux disease) 06/06/2015   Costochondritis 06/06/2015     REFERRING PROVIDER: Kay Kemps, MD   REFERRING DIAG: M17.11 (ICD-10-CM) - Unilateral primary osteoarthritis, right knee  S/p R TKA   THERAPY DIAG:  Right knee pain, unspecified chronicity  Stiffness of right knee, not elsewhere classified  Muscle weakness (generalized)  Difficulty in walking, not elsewhere classified  Rationale for Evaluation and Treatment: Rehabilitation  ONSET DATE: DOS  12/25/23  SUBJECTIVE:   SUBJECTIVE STATEMENT:  Pt is 10 weeks s/p R TKA.  Pt denies any adverse effects after prior treatment.  Pt's back began hurting yesterday and she is having back pain today.  She sat in a different wooden chair yesterday while using the computer.  Pt does some standing household chores though is limited.  Pt states she is able to perform 30 mins of household chores and then her knee starts to burn.         PERTINENT HISTORY: R TKA  12/25/23 Arthritis including L knee, A-fib, anxiety, HTN   Pt reports having a fx in R hip 20 years ago.  PAIN:  NPRS:  1-2/10 Location:  R knee   NPRS:  3/10 Location:  R sided lumbar, R anterior hip  PRECAUTIONS: Other: per surgical protocol for TKA    WEIGHT BEARING RESTRICTIONS: none indicated  FALLS:  Has patient fallen in last 6 months? No  LIVING ENVIRONMENT: Lives with: lives with their spouse Lives in: 1 story home Stairs: small step to enter/exit home without rail Has following equipment at home: Single point cane, Walker - 2 wheeled, and Crutches, bedside toilet, shower stool  OCCUPATION: Pt is retired.   PLOF: Independent.  Pt ambulating without an AD.   PATIENT GOALS:  Pt wants to be able to do everything.  Be able to garden.  She wants to be able to get up and down with ease and play with her grandson.     NEXT  MD VISIT: 01/07/2024  OBJECTIVE:  Note: Objective measures were completed at Evaluation unless otherwise noted.  DIAGNOSTIC FINDINGS: Pt is post op.   OBSERVATION:  Pt wearing bilat compression socks.  Aquacel bandage has been removed.  Incision intact and dry.  No signs of infection.     LOWER EXTREMITY ROM:   ROM Right eval Right 8/29 Right 01/15/24 09/08 Right  09/10 10/10 Right 10/14 Right   Hip flexion         Hip extension         Hip abduction         Hip adduction         Hip internal rotation         Hip external rotation         Knee flexion 60 deg PROM 98 deg PROM Seated AROM 100* 107 Passive  110 120 AROM 118  Knee extension 5/4  AROM/PROM 4 deg AROM Seated LAQ 19*, supine quad set with heel prop 6*   0 deg AROM 0  Ankle dorsiflexion         Ankle plantarflexion         Ankle inversion         Ankle eversion          (Blank rows = not tested)   GAIT: Comments: slow gait, step to pattern with FWW.  Decreased Wb'ing through R LE and increased Wb'ing through UE's.                                                                                                                                TREATMENT:    03/04/24 Scifit L3 seat 11 x 5 mins  Pt received R knee flexion and extension PROM in supine.  STM and TPR to R quad and gastroc in supine  LAQ 4# 3x10 Seated HS curl GTB 3x10 Shuttle leg press bilat LE's 3-25's 2x10 Lateral band walks with RTB around thighs x 3.75 laps at rail  Pt ascended and descended stairs with a rail with a step through to a reciprocal gait x 2 reps  PT educated pt concerning POC and answered pt's questions.   03/02/24 Scifit L3 seat 11 x 5 mins  Pt received R knee flexion and extension PROM in supine.  STM and TPR to R quad and gastroc in supine  LAQ 3# 3x12 Seated HS curl RTB x10, GTB 2x10 Shuttle leg press bilat LE's 3-25's 2x12  Step ups 6 inch step 2x10 Lateral step ups 6 inch Sidestepping Lateral band walks with  RTB around thighs 2x10   02/24/24  Scifit L3 seat 12 x8 minutes for ROM and gentle warm up Shuttle leg press BLEs 75# 2x12  Shuttle LE press single leg  R 37# 2x12  Bridges + ABD into green TB 2x12   SAQ 3# 2x12 R LAQs 3# 2x12 R Prone HS curls 3# 2x12 R  Percussion gun R quad    10/13 Manual:  PROM into extension with distraction  Trigger point release to gastroc Review of self mobilization  Extension ROM with distraction  Trigger point release to posterior knee  Trigger point release to the quad  Edema massage    SAQ 3x10  2.5# LAQ  3x10  2.5# Seated HS curls with RTB 3x10  Step ups 2x10  6 inch Lateral step ups 2x10  6 inch Step down  4 inch 2x10  10/10 Bike x 5 mins Supine heel slides with strap x 10 reps  Pt received sup/inf patellar mobs f/b R knee flexion and extension PROM in supine.  Pt received R knee manual extension stretch with heel propped on half roll.   PT assessed ROM.  See above.   SAQ 3x10  2.5# LAQ  3x10  2.5# Seated HS curls with RTB 3x10  Step ups 3x10  6 inch Lateral step ups 2x10  6 inch Step down  4 inch 2x10  LEFS:  44/80    PATIENT EDUCATION:  Education details:  post op protocol restrictions and expectations, exercise form, sx response and management, HEP, dx, prognosis, gait, and POC.  PT answered pt's questions.  Person educated: Patient Education method: Explanation, Demonstration, Tactile cues, Verbal cues Education comprehension: verbalized understanding, returned demonstration, verbal cues required, and tactile cues required  HOME EXERCISE PROGRAM: Exercises - Supine Quadricep Sets  - 2 x daily - 7 x weekly - 2 sets - 10 reps - 5 seconds hold - ANKLE PUMPS  - 7 x weekly - Seated Passive Knee Extension  - 2-3 x daily - 7 x weekly - 2 reps - 2 minutes hold -Sitting with knee flexed for 3-5 mins comfortably at various times for 6-7 times/day  Access Code: [email or web address] URL: [email or web address] Date:  12/28/2023 Prepared by: Mose Minerva  Exercises - Supine Gluteal Sets  - 2 x daily - 7 x weekly - 2 sets - 10 reps - 5 seconds hold - Supine Heel Slide  - 2 - 3 x daily - 7 x weekly - 1 sets - 10 reps - Supine Short Arc Quad  - 1 x daily - 7 x weekly - 2 sets - 10 reps - Supine Active Straight Leg Raise  - 2 x daily - 7 x weekly - 1 sets - 5 reps - Supine Heel Slide with Strap  - 2 x daily - 7 x weekly - 2 sets - 10 reps  ASSESSMENT:  CLINICAL IMPRESSION:  Pt presents to treatment reporting having R sided lumbar and hip pain and states she sat in a wooden chair without a cushion yesterday while using the computer.  Pt  tolerated PROM well.  She had some tightness in R knee extension though measured at 0 deg actively after PROM.  PT performed STM to quad and calf to improve soft tissue tightness and mobility and spasms.  Pt had soft tissue tightness and tenderness with STM to those areas.  She performed exercises per protocol well and is improving with R LE strength.  Pt has made good progress with stairs as evidenced by performing a step through to reciprocal gait with ascending and descending stairs with a  rail.  She responded well to treatment reporting no increased knee pain and stated her back feels better after treatment.  She should benefit from cont skilled PT per protocol to address impairments and improve overall function.    OBJECTIVE IMPAIRMENTS: Abnormal gait, decreased activity tolerance, decreased endurance, decreased mobility, difficulty walking, decreased ROM, decreased strength, hypomobility, increased edema, impaired flexibility, and pain.   ACTIVITY LIMITATIONS: bending, standing, squatting, stairs, transfers, bed mobility, dressing, and locomotion level  PARTICIPATION LIMITATIONS: meal prep, cleaning, laundry, driving, shopping, and community activity  PERSONAL FACTORS: 1-2 comorbidities: L knee arthritis, A-fib are also affecting patient's functional outcome.   REHAB  POTENTIAL: Good  CLINICAL DECISION MAKING: Stable/uncomplicated  EVALUATION COMPLEXITY: Low   GOALS:   SHORT TERM GOALS:  Pt will be independent and compliant with HEP for improved pain, ROM, strength, and function.  Baseline: Goal status: MET 01/15/24 Target date:     2.  Pt will demo a good quad set and be able to perform a supine SLR independently for improved strength and stability with gait. Baseline:  Goal status: ONGOING 01/15/24 Target date:  01/25/24   3.  Pt will demo improved R knee AROM to 0 - 95 deg for improved mobility, stiffness, and gait.   Baseline:  Goal status: GOAL MET  10/10 Target date:  01/25/24  4.  Pt will demo improved quality of gait and ambulate with a SPC with good stability.  Baseline:  Goal status: GOAL MET  10/10 Target date:  01/25/24  5.  Pt will able to perform her normal daily transfers with no > than minimal difficulty.   Baseline:  Goal status: GOAL MET  10/10 Target date: 02/22/2024    LONG TERM GOALS: Target date: 03/18/24  Pt will demo improved R knee AROM to 0 - 115 deg for improved mobility and stiffness and for improved performance of stairs.  Baseline:  Goal status: GOAL MET  10/10  2.  Pt will ambulate with a normalized heel to toe gait without limping.  Baseline:  Goal status:  PROGRESSING  3.  Pt will ambulate community distance without an AD without significant pain and difficulty.  Baseline:  Goal status: PROGRESSING  4.  Pt will be able to perform household chores without significant pain and difficulty.   Baseline:  Goal status: PROGRESSING  10/24  5.  Pt will be able to perform 4-5 steps with a reciprocal gait with the rail for improved performance of stairs.    Baseline:  Goal status:  INITIAL  6.  Pt will demo improved strength to 4+/5 in R hip abd, 4+ to 5/5 in Hip flexion, and 5/5 in knee ext and flexion for improved performance of daily functional mobility and to assist with returning to recreational  activities.  Baseline:  Goal status: INITIAL   PLAN:  PT FREQUENCY: 2-3x/wk x 3 weeks and 2x/wk afterwards  PT DURATION: 10 weeks  PLANNED INTERVENTIONS: 02835- PT Re-evaluation, 97750- Physical Performance Testing,  97110-Therapeutic exercises, 97530- Therapeutic activity, [id number]- Neuromuscular re-education, [phone]- Self Care, 02859- Manual therapy, [phone]- Gait training, [phone]- Aquatic Therapy, Patient/Family education, Balance training, Stair training, Taping, Joint mobilization, Spinal mobilization, Scar mobilization, DME instructions, Cryotherapy, and Moist heat  PLAN FOR NEXT SESSION: Cont per TKA protocol.  Review and perform HEP, progressions PRN.  Knee ROM.  Gait training. Manual as appropriate/desired   Leigh Minerva III PT, DPT 03/04/24 12:27 PM

## 2024-03-07 ENCOUNTER — Ambulatory Visit (HOSPITAL_BASED_OUTPATIENT_CLINIC_OR_DEPARTMENT_OTHER): Admitting: Physical Therapy

## 2024-03-07 ENCOUNTER — Encounter (HOSPITAL_BASED_OUTPATIENT_CLINIC_OR_DEPARTMENT_OTHER): Payer: Self-pay | Admitting: Physical Therapy

## 2024-03-07 DIAGNOSIS — M25661 Stiffness of right knee, not elsewhere classified: Secondary | ICD-10-CM

## 2024-03-07 DIAGNOSIS — M25561 Pain in right knee: Secondary | ICD-10-CM

## 2024-03-07 DIAGNOSIS — M6281 Muscle weakness (generalized): Secondary | ICD-10-CM | POA: Diagnosis not present

## 2024-03-07 DIAGNOSIS — R262 Difficulty in walking, not elsewhere classified: Secondary | ICD-10-CM
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
## 2024-03-08 NOTE — Telephone Encounter (Signed)
 fyi

## 2024-03-09 ENCOUNTER — Encounter (HOSPITAL_BASED_OUTPATIENT_CLINIC_OR_DEPARTMENT_OTHER): Payer: Self-pay | Admitting: Physical Therapy

## 2024-03-09 ENCOUNTER — Ambulatory Visit (HOSPITAL_BASED_OUTPATIENT_CLINIC_OR_DEPARTMENT_OTHER): Admitting: Physical Therapy

## 2024-03-09 DIAGNOSIS — M25561 Pain in right knee: Secondary | ICD-10-CM | POA: Diagnosis not present

## 2024-03-09 DIAGNOSIS — M6281 Muscle weakness (generalized): Secondary | ICD-10-CM | POA: Diagnosis not present

## 2024-03-09 DIAGNOSIS — R262 Difficulty in walking, not elsewhere classified: Secondary | ICD-10-CM

## 2024-03-09 DIAGNOSIS — M25661 Stiffness of right knee, not elsewhere classified: Secondary | ICD-10-CM | POA: Diagnosis not present

## 2024-03-09 NOTE — Therapy (Signed)
 OUTPATIENT PHYSICAL THERAPY LOWER EXTREMITY TREATMENT        Patient Name: Dawn Haley MRN: [id number] DOB:1955/10/21, 68 y.o., female Today's Date: 03/10/2024  END OF SESSION:  PT End of Session - 03/09/24 1156     Visit Number 26    Number of Visits 28    Date for Recertification  03/18/24    PT Start Time 1156    PT Stop Time 1239    PT Time Calculation (min) 43 min    Activity Tolerance Patient tolerated treatment well    Behavior During Therapy WFL for tasks assessed/performed                         Past Medical History:  Diagnosis Date   Anxiety    Arthritis    Diverticulitis    Dyspnea    walking up hill   Dysrhythmia    PAF   Hypertension    Meniere disease of right ear    Pneumonia    Pre-diabetes    Past Surgical History:  Procedure Laterality Date   ABDOMINAL HYSTERECTOMY     APPENDECTOMY     COLON SURGERY  2022   Colectomy for colovaginal fistula & diverticulitis   CYST EXCISION Right 11/02/2015   Procedure: RIGHT INDEX EXCISION MASS ;  Surgeon: Franky Curia, MD;  Location: [facility];  Service: Orthopedics;  Laterality: Right;   CYSTOSCOPY Bilateral 11/09/2019   Procedure: CYSTOSCOPY WITH BILATERAL FIREFLY  INJECTION;  Surgeon: Devere Lonni Righter, MD;  Location: [facility];  Service: Urology;  Laterality: Bilateral;   DIAGNOSTIC LAPAROSCOPY     GYN   DILATION AND CURETTAGE OF UTERUS     TONSILLECTOMY     TOTAL KNEE ARTHROPLASTY Right 12/25/2023   Procedure: ARTHROPLASTY, KNEE, TOTAL;  Surgeon: Kay Kemps, MD;  Location: [facility];  Service: Orthopedics;  Laterality: Right;   URETHRAL DILATION     WISDOM TOOTH EXTRACTION     Patient Active Problem List   Diagnosis Date Noted   Hyperlipidemia 03/02/2024   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   Paroxysmal atrial fibrillation (HCC) 02/17/2023   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   Hypocalcemia 10/27/2019   Sigmoid  diverticulitis 10/26/2019   Chest pain 06/06/2015   GERD (gastroesophageal reflux disease) 06/06/2015   Costochondritis 06/06/2015     REFERRING PROVIDER: Kay Kemps, MD   REFERRING DIAG: M17.11 (ICD-10-CM) - Unilateral primary osteoarthritis, right knee  S/p R TKA   THERAPY DIAG:  Right knee pain, unspecified chronicity  Stiffness of right knee, not elsewhere classified  Muscle weakness (generalized)  Difficulty in walking, not elsewhere classified  Rationale for Evaluation and Treatment: Rehabilitation  ONSET DATE: DOS  12/25/23  SUBJECTIVE:   SUBJECTIVE STATEMENT:  Pt states she felt ok after prior treatment.  Pt didn't take meds before bedtime and had difficulty sleeping last night.  Pt states she is having anterior knee pain today.      PERTINENT HISTORY: R TKA  12/25/23 Arthritis including L knee, A-fib, anxiety, HTN   Pt reports having a fx in R hip 20 years ago.  PAIN:  NPRS:  0-32/10 Location:  R knee    PRECAUTIONS: Other: per surgical protocol for TKA    WEIGHT BEARING RESTRICTIONS: none indicated  FALLS:  Has patient fallen in last 6 months? No  LIVING ENVIRONMENT: Lives with: lives with their spouse Lives in: 1 story home Stairs: small step to enter/exit home  without rail Has following equipment at home: Single point cane, Walker - 2 wheeled, and Crutches, bedside toilet, shower stool  OCCUPATION: Pt is retired.   PLOF: Independent.  Pt ambulating without an AD.   PATIENT GOALS:  Pt wants to be able to do everything.  Be able to garden.  She wants to be able to get up and down with ease and play with her grandson.     NEXT MD VISIT: 01/07/2024  OBJECTIVE:  Note: Objective measures were completed at Evaluation unless otherwise noted.  DIAGNOSTIC FINDINGS: Pt is post op.   OBSERVATION:  Pt wearing bilat compression socks.  Aquacel bandage has been removed.  Incision intact and dry.  No signs of infection.     LOWER EXTREMITY  ROM:   ROM Right eval Right 8/29 Right 01/15/24 09/08 Right  09/10 10/10 Right 10/14 Right   Hip flexion         Hip extension         Hip abduction         Hip adduction         Hip internal rotation         Hip external rotation         Knee flexion 60 deg PROM 98 deg PROM Seated AROM 100* 107 Passive  110 120 AROM 118  Knee extension 5/4  AROM/PROM 4 deg AROM Seated LAQ 19*, supine quad set with heel prop 6*   0 deg AROM 0  Ankle dorsiflexion         Ankle plantarflexion         Ankle inversion         Ankle eversion          (Blank rows = not tested)   GAIT: Comments: slow gait, step to pattern with FWW.  Decreased Wb'ing through R LE and increased Wb'ing through UE's.                                                                                                                                TREATMENT:    03/09/24 Recumbent bike x 5 mins  lvl 3 Cybex leg press  50# 2x10, 55# x10 LAQ  5# 3x10 Seated HS curls with GTB 3x10 TKE with GTB 2x10 Lateral band walks with RTB around thighs x 1 lap with and 2 laps with GTB at rail  Pt ascended and descended stairs with a rail with a reciprocal gait x 2 reps   Lateral step ups 2x10  6 inch step Squats with UE support on rail  Pt received R knee flexion and extension PROM in supine.   Reviewed and updated HEP.  Pt received a HEP handout and was educated in correct form and appropriate frequency.   03/07/24 Recumbent bike x 5 mins  lvl 3 Cybex leg press  50# 2x10, 55# x8 LAQ  4# 2x12, 5# x10 Seated HS curls with GTB 3x10  TKE with GTB 2x10 Lateral band walks with RTB around thighs (10 steps) x 2 laps in hallway  Pt ascended and descended stairs with a rail with a step through to a reciprocal gait x 2 reps    Pt received R knee flexion and extension PROM in supine.  STM and TPR to R quad in supine  PT educated pt concerning POC.   03/04/24 Scifit L3 seat 11 x 5 mins  Pt received R knee flexion and extension PROM  in supine.  STM and TPR to R quad and gastroc in supine  LAQ 4# 3x10 Seated HS curl GTB 3x10 Shuttle leg press bilat LE's 3-25's 2x10 Lateral band walks with RTB around thighs x 3.75 laps at rail TKE with GTB  Pt ascended and descended stairs with a rail with a reciprocal gait x 2 reps    PT educated pt concerning POC and answered pt's questions.   03/02/24 Scifit L3 seat 11 x 5 mins  Pt received R knee flexion and extension PROM in supine.  STM and TPR to R quad and gastroc in supine  LAQ 3# 3x12 Seated HS curl RTB x10, GTB 2x10 Shuttle leg press bilat LE's 3-25's 2x12  Step ups 6 inch step 2x10 Lateral step ups 6 inch Sidestepping Lateral band walks with RTB around thighs 2x10   02/24/24  Scifit L3 seat 12 x8 minutes for ROM and gentle warm up Shuttle leg press BLEs 75# 2x12  Shuttle LE press single leg  R 37# 2x12  Bridges + ABD into green TB 2x12   SAQ 3# 2x12 R LAQs 3# 2x12 R Prone HS curls 3# 2x12 R  Percussion gun R quad     PATIENT EDUCATION:  Education details:  post op protocol restrictions and expectations, exercise form, sx response and management, HEP, dx, prognosis, gait, and POC.  PT answered pt's questions.  Person educated: Patient Education method: Explanation, Demonstration, Tactile cues, Verbal cues Education comprehension: verbalized understanding, returned demonstration, verbal cues required, and tactile cues required  HOME EXERCISE PROGRAM: Exercises - Supine Quadricep Sets  - 2 x daily - 7 x weekly - 2 sets - 10 reps - 5 seconds hold - ANKLE PUMPS  - 7 x weekly - Seated Passive Knee Extension  - 2-3 x daily - 7 x weekly - 2 reps - 2 minutes hold -Sitting with knee flexed for 3-5 mins comfortably at various times for 6-7 times/day  Access Code: [email or web address] URL: [email or web address] Date: 12/28/2023 Prepared by: Mose Minerva  Exercises - Supine Gluteal Sets  - 2 x daily - 7 x weekly - 2 sets - 10 reps - 5 seconds  hold - Supine Heel Slide  - 2 - 3 x daily - 7 x weekly - 1 sets - 10 reps - Supine Short Arc Quad  - 1 x daily - 7 x weekly - 2 sets - 10 reps - Supine Active Straight Leg Raise  - 2 x daily - 7 x weekly - 1 sets - 5 reps - Supine Heel Slide with Strap  - 2 x daily - 7 x weekly - 2 sets - 10 reps  Updated HEP: - Seated Hamstring Curl with Anchored Resistance  - 1 x daily - 4 x weekly - 2-3 sets - 10 reps - Side Stepping with Resistance at Thighs and Counter Support  - 1 x daily - 3 x weekly - 3 reps  ASSESSMENT:  CLINICAL IMPRESSION:  Pt is improving with LE strength as  evidenced by performance of exercises.  She performed exercises per protocol well and had good tolerance with exercises.  She demonstrates improved performance of stairs having a reciprocal gait with descending stairs.  PT reviewed HEP and updated HEP.  Pt received a HEP handout and demonstrates good understanding.  Pt responded well to treatment having no c/o's after treatment.       OBJECTIVE IMPAIRMENTS: Abnormal gait, decreased activity tolerance, decreased endurance, decreased mobility, difficulty walking, decreased ROM, decreased strength, hypomobility, increased edema, impaired flexibility, and pain.   ACTIVITY LIMITATIONS: bending, standing, squatting, stairs, transfers, bed mobility, dressing, and locomotion level  PARTICIPATION LIMITATIONS: meal prep, cleaning, laundry, driving, shopping, and community activity  PERSONAL FACTORS: 1-2 comorbidities: L knee arthritis, A-fib are also affecting patient's functional outcome.   REHAB POTENTIAL: Good  CLINICAL DECISION MAKING: Stable/uncomplicated  EVALUATION COMPLEXITY: Low   GOALS:   SHORT TERM GOALS:  Pt will be independent and compliant with HEP for improved pain, ROM, strength, and function.  Baseline: Goal status: MET 01/15/24 Target date:     2.  Pt will demo a good quad set and be able to perform a supine SLR independently for improved strength and  stability with gait. Baseline:  Goal status: ONGOING 01/15/24 Target date:  01/25/24   3.  Pt will demo improved R knee AROM to 0 - 95 deg for improved mobility, stiffness, and gait.   Baseline:  Goal status: GOAL MET  10/10 Target date:  01/25/24  4.  Pt will demo improved quality of gait and ambulate with a SPC with good stability.  Baseline:  Goal status: GOAL MET  10/10 Target date:  01/25/24  5.  Pt will able to perform her normal daily transfers with no > than minimal difficulty.   Baseline:  Goal status: GOAL MET  10/10 Target date: 02/22/2024    LONG TERM GOALS: Target date: 03/18/24  Pt will demo improved R knee AROM to 0 - 115 deg for improved mobility and stiffness and for improved performance of stairs.  Baseline:  Goal status: GOAL MET  10/10  2.  Pt will ambulate with a normalized heel to toe gait without limping.  Baseline:  Goal status:  PROGRESSING  3.  Pt will ambulate community distance without an AD without significant pain and difficulty.  Baseline:  Goal status: PROGRESSING  4.  Pt will be able to perform household chores without significant pain and difficulty.   Baseline:  Goal status: PROGRESSING  10/24  5.  Pt will be able to perform 4-5 steps with a reciprocal gait with the rail for improved performance of stairs.    Baseline:  Goal status:  INITIAL  6.  Pt will demo improved strength to 4+/5 in R hip abd, 4+ to 5/5 in Hip flexion, and 5/5 in knee ext and flexion for improved performance of daily functional mobility and to assist with returning to recreational activities.  Baseline:  Goal status: INITIAL   PLAN:  PT FREQUENCY: 2-3x/wk x 3 weeks and 2x/wk afterwards  PT DURATION: 10 weeks  PLANNED INTERVENTIONS: 97164- PT Re-evaluation, 97750- Physical Performance Testing, 97110-Therapeutic exercises, 97530- Therapeutic activity, 97112- Neuromuscular re-education, 97535- Self Care, [id number]- Manual therapy, [phone]- Gait training, [phone]- Aquatic  Therapy, Patient/Family education, Balance training, Stair training, Taping, Joint mobilization, Spinal mobilization, Scar mobilization, DME instructions, Cryotherapy, and Moist heat  PLAN FOR NEXT SESSION: Cont per TKA protocol.  Review and perform HEP, progressions PRN.  Knee ROM.  Gait training. Manual as appropriate/desired  Leigh Minerva III PT, DPT 03/10/24 11:53 PM

## 2024-03-15 ENCOUNTER — Encounter (HOSPITAL_BASED_OUTPATIENT_CLINIC_OR_DEPARTMENT_OTHER): Payer: Self-pay | Admitting: Physical Therapy

## 2024-03-15 ENCOUNTER — Ambulatory Visit (HOSPITAL_BASED_OUTPATIENT_CLINIC_OR_DEPARTMENT_OTHER): Attending: Orthopedic Surgery | Admitting: Physical Therapy

## 2024-03-15 DIAGNOSIS — R262 Difficulty in walking, not elsewhere classified: Secondary | ICD-10-CM | POA: Insufficient documentation

## 2024-03-15 DIAGNOSIS — M25661 Stiffness of right knee, not elsewhere classified: Secondary | ICD-10-CM | POA: Insufficient documentation

## 2024-03-15 DIAGNOSIS — M6281 Muscle weakness (generalized): Secondary | ICD-10-CM | POA: Diagnosis not present

## 2024-03-15 DIAGNOSIS — M25561 Pain in right knee: Secondary | ICD-10-CM | POA: Insufficient documentation

## 2024-03-15 NOTE — Therapy (Signed)
 OUTPATIENT PHYSICAL THERAPY LOWER EXTREMITY TREATMENT        Patient Name: Dawn Haley MRN: [id number] DOB:02-13-1956, 68 y.o., female Today's Date: 03/16/2024  END OF SESSION:  PT End of Session - 03/15/24 1106     Visit Number 27    Number of Visits 28    Date for Recertification  03/18/24    PT Start Time 1100    PT Stop Time 1149    PT Time Calculation (min) 49 min    Activity Tolerance Patient tolerated treatment well    Behavior During Therapy WFL for tasks assessed/performed                         Past Medical History:  Diagnosis Date   Anxiety    Arthritis    Diverticulitis    Dyspnea    walking up hill   Dysrhythmia    PAF   Hypertension    Meniere disease of right ear    Pneumonia    Pre-diabetes    Past Surgical History:  Procedure Laterality Date   ABDOMINAL HYSTERECTOMY     APPENDECTOMY     COLON SURGERY  2022   Colectomy for colovaginal fistula & diverticulitis   CYST EXCISION Right 11/02/2015   Procedure: RIGHT INDEX EXCISION MASS ;  Surgeon: Franky Curia, MD;  Location: [facility];  Service: Orthopedics;  Laterality: Right;   CYSTOSCOPY Bilateral 11/09/2019   Procedure: CYSTOSCOPY WITH BILATERAL FIREFLY  INJECTION;  Surgeon: Devere Lonni Righter, MD;  Location: [facility];  Service: Urology;  Laterality: Bilateral;   DIAGNOSTIC LAPAROSCOPY     GYN   DILATION AND CURETTAGE OF UTERUS     TONSILLECTOMY     TOTAL KNEE ARTHROPLASTY Right 12/25/2023   Procedure: ARTHROPLASTY, KNEE, TOTAL;  Surgeon: Kay Kemps, MD;  Location: [facility];  Service: Orthopedics;  Laterality: Right;   URETHRAL DILATION     WISDOM TOOTH EXTRACTION     Patient Active Problem List   Diagnosis Date Noted   Hyperlipidemia 03/02/2024   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   Paroxysmal atrial fibrillation (HCC) 02/17/2023   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   Hypocalcemia 10/27/2019   Sigmoid  diverticulitis 10/26/2019   Chest pain 06/06/2015   GERD (gastroesophageal reflux disease) 06/06/2015   Costochondritis 06/06/2015     REFERRING PROVIDER: Kay Kemps, MD   REFERRING DIAG: M17.11 (ICD-10-CM) - Unilateral primary osteoarthritis, right knee  S/p R TKA   THERAPY DIAG:  Right knee pain, unspecified chronicity  Stiffness of right knee, not elsewhere classified  Muscle weakness (generalized)  Difficulty in walking, not elsewhere classified  Rationale for Evaluation and Treatment: Rehabilitation  ONSET DATE: DOS  12/25/23  SUBJECTIVE:   SUBJECTIVE STATEMENT:  Pt is 11 weeks and 4 days s/p R TKA.  She states her hips were a little tired, though she felt fine after prior treatment.  Pt has increased pain at nighttime, which is her worst pain.  Pt has disturbed sleep.  Her L knee has been bothering her some recently.  Pt reports her knee hasn't spasmed recently until this AM.      PERTINENT HISTORY: R TKA  12/25/23 Arthritis including L knee, A-fib, anxiety, HTN   Pt reports having a fx in R hip 20 years ago.  PAIN:  NPRS:  0-3/10 Location:  R knee    PRECAUTIONS: Other: per surgical protocol for TKA    WEIGHT BEARING RESTRICTIONS:  none indicated  FALLS:  Has patient fallen in last 6 months? No  LIVING ENVIRONMENT: Lives with: lives with their spouse Lives in: 1 story home Stairs: small step to enter/exit home without rail Has following equipment at home: Single point cane, Walker - 2 wheeled, and Crutches, bedside toilet, shower stool  OCCUPATION: Pt is retired.   PLOF: Independent.  Pt ambulating without an AD.   PATIENT GOALS:  Pt wants to be able to do everything.  Be able to garden.  She wants to be able to get up and down with ease and play with her grandson.     NEXT MD VISIT: 01/07/2024  OBJECTIVE:  Note: Objective measures were completed at Evaluation unless otherwise noted.  DIAGNOSTIC FINDINGS: Pt is post op.   OBSERVATION:   Pt wearing bilat compression socks.  Aquacel bandage has been removed.  Incision intact and dry.  No signs of infection.     LOWER EXTREMITY ROM:   ROM Right eval Right 8/29 Right 01/15/24 09/08 Right  09/10 10/10 Right 10/14 Right   Hip flexion         Hip extension         Hip abduction         Hip adduction         Hip internal rotation         Hip external rotation         Knee flexion 60 deg PROM 98 deg PROM Seated AROM 100* 107 Passive  110 120 AROM 118  Knee extension 5/4  AROM/PROM 4 deg AROM Seated LAQ 19*, supine quad set with heel prop 6*   0 deg AROM 0  Ankle dorsiflexion         Ankle plantarflexion         Ankle inversion         Ankle eversion          (Blank rows = not tested)   GAIT: Comments: slow gait, step to pattern with FWW.  Decreased Wb'ing through R LE and increased Wb'ing through UE's.                                                                                                                                TREATMENT:    03/15/24 LF Recumbent bike x 5 mins lvl 5-6 Squats 2x12 with UE support on table TKE GTB x12, BTB 2x10 Lateral band walks with GTB above knees Cybex leg press  55# 3x10-12 LAQ 5#x10, 6.5# 2x10 Seated HS curls with GTB 3x10-12 Marching on airex with UE support 2x10 Heel raises on airex 2x10  Pt received R knee flexion and extension PROM with OP into extension.  03/09/24 Recumbent bike x 5 mins  lvl 3 Cybex leg press  50# 2x10, 55# x10 LAQ  5# 3x10 Seated HS curls with GTB 3x10 TKE with GTB 2x10 Lateral band walks with RTB around thighs x 1 lap  with and 2 laps with GTB at rail  Pt ascended and descended stairs with a rail with a reciprocal gait x 2 reps   Lateral step ups 2x10  6 inch step Squats with UE support on rail  Pt received R knee flexion and extension PROM in supine.   Reviewed and updated HEP.  Pt received a HEP handout and was educated in correct form and appropriate frequency.   03/07/24 Recumbent  bike x 5 mins  lvl 3 Cybex leg press  50# 2x10, 55# x8 LAQ  4# 2x12, 5# x10 Seated HS curls with GTB 3x10 TKE with GTB 2x10 Lateral band walks with RTB around thighs (10 steps) x 2 laps in hallway  Pt ascended and descended stairs with a rail with a step through to a reciprocal gait x 2 reps    Pt received R knee flexion and extension PROM in supine.  STM and TPR to R quad in supine  PT educated pt concerning POC.   03/04/24 Scifit L3 seat 11 x 5 mins  Pt received R knee flexion and extension PROM in supine.  STM and TPR to R quad and gastroc in supine  LAQ 4# 3x10 Seated HS curl GTB 3x10 Shuttle leg press bilat LE's 3-25's 2x10 Lateral band walks with RTB around thighs x 3.75 laps at rail TKE with GTB  Pt ascended and descended stairs with a rail with a reciprocal gait x 2 reps    PT educated pt concerning POC and answered pt's questions.   03/02/24 Scifit L3 seat 11 x 5 mins  Pt received R knee flexion and extension PROM in supine.  STM and TPR to R quad and gastroc in supine  LAQ 3# 3x12 Seated HS curl RTB x10, GTB 2x10 Shuttle leg press bilat LE's 3-25's 2x12  Step ups 6 inch step 2x10 Lateral step ups 6 inch Sidestepping Lateral band walks with RTB around thighs 2x10   PATIENT EDUCATION:  Education details:  post op protocol restrictions and expectations, exercise form, sx response and management, HEP, dx, prognosis, gait, and POC.  PT answered pt's questions.  Person educated: Patient Education method: Explanation, Demonstration, Tactile cues, Verbal cues Education comprehension: verbalized understanding, returned demonstration, verbal cues required, and tactile cues required  HOME EXERCISE PROGRAM: Exercises - Supine Quadricep Sets  - 2 x daily - 7 x weekly - 2 sets - 10 reps - 5 seconds hold - ANKLE PUMPS  - 7 x weekly - Seated Passive Knee Extension  - 2-3 x daily - 7 x weekly - 2 reps - 2 minutes hold -Sitting with knee flexed for 3-5 mins  comfortably at various times for 6-7 times/day  Access Code: [email or web address] URL: [email or web address] Date: 12/28/2023 Prepared by: Mose Minerva  Exercises - Supine Gluteal Sets  - 2 x daily - 7 x weekly - 2 sets - 10 reps - 5 seconds hold - Supine Heel Slide  - 2 - 3 x daily - 7 x weekly - 1 sets - 10 reps - Supine Short Arc Quad  - 1 x daily - 7 x weekly - 2 sets - 10 reps - Supine Active Straight Leg Raise  - 2 x daily - 7 x weekly - 1 sets - 5 reps - Supine Heel Slide with Strap  - 2 x daily - 7 x weekly - 2 sets - 10 reps - Seated Hamstring Curl with Anchored Resistance  - 1 x daily - 4 x weekly - 2-3 sets -  10 reps - Side Stepping with Resistance at Thighs and Counter Support  - 1 x daily - 3 x weekly - 3 reps  ASSESSMENT:  CLINICAL IMPRESSION:  Pt is progressing well in PT.  She continues to have disturbed sleep stating her worst pain is at night.  PT educated pt in set up of gym recumbent bike and leg press.  Pt required cues for correct form with squats.  PT increased resistance with LAQ and pt tolerated it well.  She performed exercises per protocol well and had good tolerance with exercises.  Pt responded well to treatment reporting no increased pain after treatment.  She states she feels fine, maybe .5/10 after treatment.     OBJECTIVE IMPAIRMENTS: Abnormal gait, decreased activity tolerance, decreased endurance, decreased mobility, difficulty walking, decreased ROM, decreased strength, hypomobility, increased edema, impaired flexibility, and pain.   ACTIVITY LIMITATIONS: bending, standing, squatting, stairs, transfers, bed mobility, dressing, and locomotion level  PARTICIPATION LIMITATIONS: meal prep, cleaning, laundry, driving, shopping, and community activity  PERSONAL FACTORS: 1-2 comorbidities: L knee arthritis, A-fib are also affecting patient's functional outcome.   REHAB POTENTIAL: Good  CLINICAL DECISION MAKING: Stable/uncomplicated  EVALUATION  COMPLEXITY: Low   GOALS:   SHORT TERM GOALS:  Pt will be independent and compliant with HEP for improved pain, ROM, strength, and function.  Baseline: Goal status: MET 01/15/24 Target date:     2.  Pt will demo a good quad set and be able to perform a supine SLR independently for improved strength and stability with gait. Baseline:  Goal status: ONGOING 01/15/24 Target date:  01/25/24   3.  Pt will demo improved R knee AROM to 0 - 95 deg for improved mobility, stiffness, and gait.   Baseline:  Goal status: GOAL MET  10/10 Target date:  01/25/24  4.  Pt will demo improved quality of gait and ambulate with a SPC with good stability.  Baseline:  Goal status: GOAL MET  10/10 Target date:  01/25/24  5.  Pt will able to perform her normal daily transfers with no > than minimal difficulty.   Baseline:  Goal status: GOAL MET  10/10 Target date: 02/22/2024    LONG TERM GOALS: Target date: 03/18/24  Pt will demo improved R knee AROM to 0 - 115 deg for improved mobility and stiffness and for improved performance of stairs.  Baseline:  Goal status: GOAL MET  10/10  2.  Pt will ambulate with a normalized heel to toe gait without limping.  Baseline:  Goal status:  PROGRESSING  3.  Pt will ambulate community distance without an AD without significant pain and difficulty.  Baseline:  Goal status: PROGRESSING  4.  Pt will be able to perform household chores without significant pain and difficulty.   Baseline:  Goal status: PROGRESSING  10/24  5.  Pt will be able to perform 4-5 steps with a reciprocal gait with the rail for improved performance of stairs.    Baseline:  Goal status:  INITIAL  6.  Pt will demo improved strength to 4+/5 in R hip abd, 4+ to 5/5 in Hip flexion, and 5/5 in knee ext and flexion for improved performance of daily functional mobility and to assist with returning to recreational activities.  Baseline:  Goal status: INITIAL   PLAN:  PT FREQUENCY: 2-3x/wk x  3 weeks and 2x/wk afterwards  PT DURATION: 10 weeks  PLANNED INTERVENTIONS: 97164- PT Re-evaluation, 97750- Physical Performance Testing, 97110-Therapeutic exercises, 97530- Therapeutic activity, [id number]- Neuromuscular  re-education, [phone]- Self Care, 02859- Manual therapy, [phone]- Gait training, [phone]- Aquatic Therapy, Patient/Family education, Balance training, Stair training, Taping, Joint mobilization, Spinal mobilization, Scar mobilization, DME instructions, Cryotherapy, and Moist heat  PLAN FOR NEXT SESSION: Cont per TKA protocol.  Review and perform HEP, progressions PRN.  Knee ROM.  Gait training. Manual as appropriate/desired.  Pt sees MD on Thursday.  Assess goals and strength next visit.  Leigh Minerva III PT, DPT 03/16/24 1:57 PM

## 2024-03-18 ENCOUNTER — Ambulatory Visit (HOSPITAL_BASED_OUTPATIENT_CLINIC_OR_DEPARTMENT_OTHER): Admitting: Physical Therapy

## 2024-03-18 ENCOUNTER — Encounter (HOSPITAL_BASED_OUTPATIENT_CLINIC_OR_DEPARTMENT_OTHER): Payer: Self-pay | Admitting: Physical Therapy

## 2024-03-18 DIAGNOSIS — R262 Difficulty in walking, not elsewhere classified: Secondary | ICD-10-CM | POA: Diagnosis not present

## 2024-03-18 DIAGNOSIS — M25561 Pain in right knee: Secondary | ICD-10-CM | POA: Diagnosis not present

## 2024-03-18 DIAGNOSIS — M6281 Muscle weakness (generalized): Secondary | ICD-10-CM

## 2024-03-18 DIAGNOSIS — M25661 Stiffness of right knee, not elsewhere classified: Secondary | ICD-10-CM

## 2024-03-18 NOTE — Therapy (Signed)
 OUTPATIENT PHYSICAL THERAPY LOWER EXTREMITY TREATMENT  Progress Note Reporting Period 02/22/2024 to 03/18/2024  See note below for Objective Data and Assessment of Progress/Goals.            Patient Name: Dawn Haley MRN: [id number] DOB:1955-08-24, 68 y.o., female Today's Date: 03/18/2024  END OF SESSION:  PT End of Session - 03/18/24 1218     Visit Number 28    Number of Visits 32    Date for Recertification  04/08/24    PT Start Time 1115    PT Stop Time 1215    PT Time Calculation (min) 60 min    Activity Tolerance Patient tolerated treatment well    Behavior During Therapy WFL for tasks assessed/performed                          Past Medical History:  Diagnosis Date   Anxiety    Arthritis    Diverticulitis    Dyspnea    walking up hill   Dysrhythmia    PAF   Hypertension    Meniere disease of right ear    Pneumonia    Pre-diabetes    Past Surgical History:  Procedure Laterality Date   ABDOMINAL HYSTERECTOMY     APPENDECTOMY     COLON SURGERY  2022   Colectomy for colovaginal fistula & diverticulitis   CYST EXCISION Right 11/02/2015   Procedure: RIGHT INDEX EXCISION MASS ;  Surgeon: Franky Curia, MD;  Location: [facility];  Service: Orthopedics;  Laterality: Right;   CYSTOSCOPY Bilateral 11/09/2019   Procedure: CYSTOSCOPY WITH BILATERAL FIREFLY  INJECTION;  Surgeon: Devere Lonni Righter, MD;  Location: [facility];  Service: Urology;  Laterality: Bilateral;   DIAGNOSTIC LAPAROSCOPY     GYN   DILATION AND CURETTAGE OF UTERUS     TONSILLECTOMY     TOTAL KNEE ARTHROPLASTY Right 12/25/2023   Procedure: ARTHROPLASTY, KNEE, TOTAL;  Surgeon: Kay Kemps, MD;  Location: [facility];  Service: Orthopedics;  Laterality: Right;   URETHRAL DILATION     WISDOM TOOTH EXTRACTION     Patient Active Problem List   Diagnosis Date Noted   Hyperlipidemia 03/02/2024   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   Paroxysmal atrial  fibrillation (HCC) 02/17/2023   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   Hypocalcemia 10/27/2019   Sigmoid diverticulitis 10/26/2019   Chest pain 06/06/2015   GERD (gastroesophageal reflux disease) 06/06/2015   Costochondritis 06/06/2015     REFERRING PROVIDER: Kay Kemps, MD   REFERRING DIAG: M17.11 (ICD-10-CM) - Unilateral primary osteoarthritis, right knee  S/p R TKA   THERAPY DIAG:  Right knee pain, unspecified chronicity  Muscle weakness (generalized)  Stiffness of right knee, not elsewhere classified  Difficulty in walking, not elsewhere classified  Rationale for Evaluation and Treatment: Rehabilitation  ONSET DATE: DOS  12/25/23  SUBJECTIVE:   SUBJECTIVE STATEMENT:  Pt saw MD who was super-impressed and pleased with how pt is doing.  Pt states MD extended PT for 2-4 more weeks depending on what PT thinks.  Pt was sore after prior treatment and had a little increased pain.  MD sent a new PT order which indicated functional training, balance, and working on getting up from the floor from a sitting position.  Pt states she wants to be able to get up/down from the floor in order to play with her grandchild.  Pt denies pain currently.  Pt gets fatigued with with  community ambulation.       PERTINENT HISTORY: R TKA  12/25/23 Arthritis including L knee, A-fib, anxiety, HTN   Pt reports having a fx in R hip 20 years ago.  PAIN:  NPRS:  0/10 Location:  R knee    PRECAUTIONS: Other: per surgical protocol for TKA    WEIGHT BEARING RESTRICTIONS: none indicated  FALLS:  Has patient fallen in last 6 months? No  LIVING ENVIRONMENT: Lives with: lives with their spouse Lives in: 1 story home Stairs: small step to enter/exit home without rail Has following equipment at home: Single point cane, Walker - 2 wheeled, and Crutches, bedside toilet, shower stool  OCCUPATION: Pt is retired.   PLOF: Independent.  Pt ambulating without an AD.    PATIENT GOALS:  Pt wants to be able to do everything.  Be able to garden.  She wants to be able to get up and down with ease and play with her grandson.     NEXT MD VISIT: 01/07/2024  OBJECTIVE:  Note: Objective measures were completed at Evaluation unless otherwise noted.  DIAGNOSTIC FINDINGS: Pt is post op.     LOWER EXTREMITY ROM:   ROM Right eval Right 8/29 Right 01/15/24 09/08 Right  09/10 10/10 Right 10/14 Right   Hip flexion         Hip extension         Hip abduction         Hip adduction         Hip internal rotation         Hip external rotation         Knee flexion 60 deg PROM 98 deg PROM Seated AROM 100* 107 Passive  110 120 AROM 118  Knee extension 5/4  AROM/PROM 4 deg AROM Seated LAQ 19*, supine quad set with heel prop 6*   0 deg AROM 0  Ankle dorsiflexion         Ankle plantarflexion         Ankle inversion         Ankle eversion          (Blank rows = not tested)                                                                                                                                   TREATMENT:    03/18/24 Scifit recumbent bike x 6 mins  lvl 3  R knee AROM: 3 - 123 deg before PROM R LE strength:  5/5 in hip abd and flexion.  4+/5 in knee flexion and extension  Gait:  Pt ambulates with a good heel to toe gait pattern.  She has improved quality of gait and no limping today.  Pt states she does limp at times.  Stairs:  Pt ascended and descended stairs with a reciprocal gait with the rail.  LAQ  5#x12, 7.5#  2x10 Seated HS curls with  GTB x10, BTB 2x10 TKE with BTB 2x10 Pt able to apply pressure with R knee into airex pad into wall  PT educated and demonstrated half floor to stand transfers.    Pt received R knee flexion and extension PROM with OP into extension.  LEFS:  52/80   03/15/24 LF Recumbent bike x 5 mins lvl 5-6 Squats 2x12 with UE support on table TKE GTB x12, BTB 2x10 Lateral band walks with GTB above knees Cybex leg  press  55# 3x10-12 LAQ 5#x10, 6.5# 2x10 Seated HS curls with GTB 3x10-12 Marching on airex with UE support 2x10 Heel raises on airex 2x10  Pt received R knee flexion and extension PROM with OP into extension.  03/09/24 Recumbent bike x 5 mins  lvl 3 Cybex leg press  50# 2x10, 55# x10 LAQ  5# 3x10 Seated HS curls with GTB 3x10 TKE with GTB 2x10 Lateral band walks with RTB around thighs x 1 lap with and 2 laps with GTB at rail  Pt ascended and descended stairs with a rail with a reciprocal gait x 2 reps   Lateral step ups 2x10  6 inch step Squats with UE support on rail  Pt received R knee flexion and extension PROM in supine.   Reviewed and updated HEP.  Pt received a HEP handout and was educated in correct form and appropriate frequency.   03/07/24 Recumbent bike x 5 mins  lvl 3 Cybex leg press  50# 2x10, 55# x8 LAQ  4# 2x12, 5# x10 Seated HS curls with GTB 3x10 TKE with GTB 2x10 Lateral band walks with RTB around thighs (10 steps) x 2 laps in hallway  Pt ascended and descended stairs with a rail with a step through to a reciprocal gait x 2 reps    Pt received R knee flexion and extension PROM in supine.  STM and TPR to R quad in supine  PT educated pt concerning POC.    PATIENT EDUCATION:  Education details:  post op protocol restrictions and expectations, exercise form, sx response and management, HEP, dx, prognosis, gait, and POC.  PT answered pt's questions.  Person educated: Patient Education method: Explanation, Demonstration, Tactile cues, Verbal cues Education comprehension: verbalized understanding, returned demonstration, verbal cues required, and tactile cues required  HOME EXERCISE PROGRAM: Exercises - Supine Quadricep Sets  - 2 x daily - 7 x weekly - 2 sets - 10 reps - 5 seconds hold - ANKLE PUMPS  - 7 x weekly - Seated Passive Knee Extension  - 2-3 x daily - 7 x weekly - 2 reps - 2 minutes hold -Sitting with knee flexed for 3-5 mins comfortably at  various times for 6-7 times/day  Access Code: [email or web address] URL: [email or web address] Date: 12/28/2023 Prepared by: Mose Minerva  Exercises - Supine Gluteal Sets  - 2 x daily - 7 x weekly - 2 sets - 10 reps - 5 seconds hold - Supine Heel Slide  - 2 - 3 x daily - 7 x weekly - 1 sets - 10 reps - Supine Short Arc Quad  - 1 x daily - 7 x weekly - 2 sets - 10 reps - Supine Active Straight Leg Raise  - 2 x daily - 7 x weekly - 1 sets - 5 reps - Supine Heel Slide with Strap  - 2 x daily - 7 x weekly - 2 sets - 10 reps - Seated Hamstring Curl with Anchored Resistance  - 1 x daily - 4 x weekly - 2-3  sets - 10 reps - Side Stepping with Resistance at Thighs and Counter Support  - 1 x daily - 3 x weekly - 3 reps  ASSESSMENT:  CLINICAL IMPRESSION:  Pt is 12 weeks s/p R TKA making great progress in all areas.  She saw MD who gave her a new order to continue PT.  Pt has improved gait and ambulates with a heel to toe gait pattern.  She is limited with ambulation due to increased fatigue. She is able to ascend and descend stairs with a reciprocal gait with good control with the rail.  Pt has made great progress in knee flexion ROM.  Pt was limited in extension ROM today.  R knee extension AROM improved from 3 deg at beginning of treatment to 2 deg at the end of treatment.  She has had 0 deg of extension prior.  Pt has good strength in R hip flex and abd and minimal weakness in knee extension and flexion.  Pt wants to be able to perform floor transfers.  PT worked on floor transfers and instructed pt in correct form.  Pt performed floor transfers well.  She demonstrates improved self perceived disability with LEFS improving from 44/80 to 52/80.  Pt has met all STG's and LTG's #  1,2,5.PT added a new goal for floor transfers.  Pt should benefit from cont skilled PT per protocol to address ongoing goals, improve strength, and maximize functional mobility.          OBJECTIVE IMPAIRMENTS: Abnormal gait,  decreased activity tolerance, decreased endurance, decreased mobility, difficulty walking, decreased ROM, decreased strength, hypomobility, increased edema, impaired flexibility, and pain.   ACTIVITY LIMITATIONS: bending, standing, squatting, stairs, transfers, bed mobility, dressing, and locomotion level  PARTICIPATION LIMITATIONS: meal prep, cleaning, laundry, driving, shopping, and community activity  PERSONAL FACTORS: 1-2 comorbidities: L knee arthritis, A-fib are also affecting patient's functional outcome.   REHAB POTENTIAL: Good  CLINICAL DECISION MAKING: Stable/uncomplicated  EVALUATION COMPLEXITY: Low   GOALS:   SHORT TERM GOALS:  Pt will be independent and compliant with HEP for improved pain, ROM, strength, and function.  Baseline: Goal status: MET 01/15/24 Target date:     2.  Pt will demo a good quad set and be able to perform a supine SLR independently for improved strength and stability with gait. Baseline:  Goal status: GOAL MET Target date:  01/25/24   3.  Pt will demo improved R knee AROM to 0 - 95 deg for improved mobility, stiffness, and gait.   Baseline:  Goal status: GOAL MET  10/10 Target date:  01/25/24  4.  Pt will demo improved quality of gait and ambulate with a SPC with good stability.  Baseline:  Goal status: GOAL MET  10/10 Target date:  01/25/24  5.  Pt will able to perform her normal daily transfers with no > than minimal difficulty.   Baseline:  Goal status: GOAL MET  10/10 Target date: 02/22/2024    LONG TERM GOALS: Target date: 04/08/24  Pt will demo improved R knee AROM to 0 - 115 deg for improved mobility and stiffness and for improved performance of stairs.  Baseline:  Goal status: GOAL MET  10/10  2.  Pt will ambulate with a normalized heel to toe gait without limping.  Baseline:  Goal status:  GOAL MET  11/7  3.  Pt will ambulate community distance without an AD without significant pain and difficulty.  Baseline:  Goal  status: PROGRESSING  11/7  4.  Pt will be able to perform household chores without significant pain and difficulty.   Baseline:  Goal status: PROGRESSING  10/24  5.  Pt will be able to perform 4-5 steps with a reciprocal gait with the rail for improved performance of stairs.    Baseline:  Goal status:  GOAL MET  11/7  6.  Pt will demo improved strength to 4+/5 in R hip abd, 4+ to 5/5 in Hip flexion, and 5/5 in knee ext and flexion for improved performance of daily functional mobility and to assist with returning to recreational activities.  Baseline:  Goal status: 50% MET  7.  Pt will be able to perform floor transfers independently without significant difficulty.  Goal status:  INITIAL   PLAN:  PT FREQUENCY: 1-2x/wk  PT DURATION: 3 weeks  PLANNED INTERVENTIONS: 02835- PT Re-evaluation, 97750- Physical Performance Testing, 97110-Therapeutic exercises, 97530- Therapeutic activity, 97112- Neuromuscular re-education, 97535- Self Care, 02859- Manual therapy, [phone]- Gait training, [phone]- Aquatic Therapy, Patient/Family education, Balance training, Stair training, Taping, Joint mobilization, Spinal mobilization, Scar mobilization, DME instructions, Cryotherapy, and Moist heat  PLAN FOR NEXT SESSION: Cont per TKA protocol.  Cont with strengthening, ROM, manual therapy.  Work on floor transfers.   Leigh Minerva III PT, DPT 03/18/24 12:58 PM

## 2024-03-20 NOTE — Therapy (Incomplete)
 OUTPATIENT PHYSICAL THERAPY LOWER EXTREMITY TREATMENT  Progress Note Reporting Period 02/22/2024 to 03/18/2024  See note below for Objective Data and Assessment of Progress/Goals.            Patient Name: Dawn Haley MRN: [id number] DOB:1955-12-07, 68 y.o., female Today's Date: 03/20/2024  END OF SESSION:                    Past Medical History:  Diagnosis Date   Anxiety    Arthritis    Diverticulitis    Dyspnea    walking up hill   Dysrhythmia    PAF   Hypertension    Meniere disease of right ear    Pneumonia    Pre-diabetes    Past Surgical History:  Procedure Laterality Date   ABDOMINAL HYSTERECTOMY     APPENDECTOMY     COLON SURGERY  2022   Colectomy for colovaginal fistula & diverticulitis   CYST EXCISION Right 11/02/2015   Procedure: RIGHT INDEX EXCISION MASS ;  Surgeon: Franky Curia, MD;  Location: [facility];  Service: Orthopedics;  Laterality: Right;   CYSTOSCOPY Bilateral 11/09/2019   Procedure: CYSTOSCOPY WITH BILATERAL FIREFLY  INJECTION;  Surgeon: Devere Lonni Righter, MD;  Location: [facility];  Service: Urology;  Laterality: Bilateral;   DIAGNOSTIC LAPAROSCOPY     GYN   DILATION AND CURETTAGE OF UTERUS     TONSILLECTOMY     TOTAL KNEE ARTHROPLASTY Right 12/25/2023   Procedure: ARTHROPLASTY, KNEE, TOTAL;  Surgeon: Kay Kemps, MD;  Location: [facility];  Service: Orthopedics;  Laterality: Right;   URETHRAL DILATION     WISDOM TOOTH EXTRACTION     Patient Active Problem List   Diagnosis Date Noted   Hyperlipidemia 03/02/2024   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   Paroxysmal atrial fibrillation (HCC) 02/17/2023   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  Rationale for Evaluation and Treatment: Rehabilitation  ONSET DATE: DOS  12/25/23  SUBJECTIVE:   SUBJECTIVE STATEMENT:  Pt is 12 weeks and 3 days s/p R TKA.   saw MD who was super-impressed and pleased with how pt is doing.  Pt states MD extended PT for 2-4 more weeks depending on what PT thinks.  Pt was sore after prior treatment and had a little increased pain.  MD sent a new PT order which indicated functional training, balance, and working on getting up from the floor from a sitting position.  Pt states she wants to be able to get up/down from the floor in order to play with her grandchild.  Pt denies pain currently.  Pt gets fatigued with with community ambulation.       PERTINENT HISTORY: R TKA  12/25/23 Arthritis including L knee, A-fib, anxiety, HTN   Pt reports having a fx in R hip 20 years ago.  PAIN:  NPRS:  0/10 Location:  R knee    PRECAUTIONS: Other: per surgical protocol for TKA    WEIGHT BEARING RESTRICTIONS: none indicated  FALLS:  Has patient fallen in last 6 months? No  LIVING ENVIRONMENT: Lives with: lives with their spouse Lives in: 1 story home Stairs:  small step to enter/exit home without rail Has following equipment at home: Single point cane, Walker - 2 wheeled, and Crutches, bedside toilet, shower stool  OCCUPATION: Pt is retired.   PLOF: Independent.  Pt ambulating without an AD.   PATIENT GOALS:  Pt wants to be able to do everything.  Be able to garden.  She wants to be able to get up and down with ease and play with her grandson.     NEXT MD VISIT:   OBJECTIVE:  Note: Objective measures were completed at Evaluation unless otherwise noted.  DIAGNOSTIC FINDINGS: Pt is post op.     LOWER EXTREMITY ROM:   ROM Right eval Right 8/29 Right 01/15/24 09/08 Right  09/10 10/10 Right 10/14 Right   Hip flexion          Hip extension         Hip abduction         Hip adduction         Hip internal rotation         Hip external rotation         Knee flexion 60 deg PROM 98 deg PROM Seated AROM 100* 107 Passive  110 120 AROM 118  Knee extension 5/4  AROM/PROM 4 deg AROM Seated LAQ 19*, supine quad set with heel prop 6*   0 deg AROM 0  Ankle dorsiflexion         Ankle plantarflexion         Ankle inversion         Ankle eversion          (Blank rows = not tested)                                                                                                                                   TREATMENT:    03/18/24 Scifit recumbent bike x 6 mins  lvl 3  R knee AROM: 3 - 123 deg before PROM R LE strength:  5/5 in hip abd and flexion.  4+/5 in knee flexion and extension  Gait:  Pt ambulates with a good heel to toe gait pattern.  She has improved quality of gait and no limping today.  Pt states she does limp at times.  Stairs:  Pt ascended and descended stairs with a reciprocal gait with the rail.  LAQ  5#x12, 7.5#  2x10 Seated HS curls with GTB x10, BTB 2x10 TKE with BTB 2x10 Pt able to apply pressure with R knee into airex pad into wall  PT educated and demonstrated half floor to stand transfers.    Pt received R knee flexion and extension PROM with OP into extension.  LEFS:  52/80   03/15/24 LF Recumbent bike x 5 mins lvl 5-6 Squats 2x12 with UE support on table TKE GTB x12, BTB 2x10 Lateral band walks with GTB above knees Cybex leg press  55# 3x10-12 LAQ 5#x10,  6.5# 2x10 Seated HS curls with GTB 3x10-12 Marching on airex with UE support 2x10 Heel raises on airex 2x10  Pt received R knee flexion and extension PROM with OP into extension.  03/09/24 Recumbent bike x 5 mins  lvl 3 Cybex leg press  50# 2x10, 55# x10 LAQ  5# 3x10 Seated HS curls with GTB 3x10 TKE with GTB 2x10 Lateral band walks with RTB around thighs x 1 lap with and 2 laps with GTB at rail  Pt ascended and  descended stairs with a rail with a reciprocal gait x 2 reps   Lateral step ups 2x10  6 inch step Squats with UE support on rail  Pt received R knee flexion and extension PROM in supine.   Reviewed and updated HEP.  Pt received a HEP handout and was educated in correct form and appropriate frequency.   03/07/24 Recumbent bike x 5 mins  lvl 3 Cybex leg press  50# 2x10, 55# x8 LAQ  4# 2x12, 5# x10 Seated HS curls with GTB 3x10 TKE with GTB 2x10 Lateral band walks with RTB around thighs (10 steps) x 2 laps in hallway  Pt ascended and descended stairs with a rail with a step through to a reciprocal gait x 2 reps    Pt received R knee flexion and extension PROM in supine.  STM and TPR to R quad in supine  PT educated pt concerning POC.    PATIENT EDUCATION:  Education details:  post op protocol restrictions and expectations, exercise form, sx response and management, HEP, dx, prognosis, gait, and POC.  PT answered pt's questions.  Person educated: Patient Education method: Explanation, Demonstration, Tactile cues, Verbal cues Education comprehension: verbalized understanding, returned demonstration, verbal cues required, and tactile cues required  HOME EXERCISE PROGRAM: Exercises - Supine Quadricep Sets  - 2 x daily - 7 x weekly - 2 sets - 10 reps - 5 seconds hold - ANKLE PUMPS  - 7 x weekly - Seated Passive Knee Extension  - 2-3 x daily - 7 x weekly - 2 reps - 2 minutes hold -Sitting with knee flexed for 3-5 mins comfortably at various times for 6-7 times/day  Access Code: [email or web address] URL: [email or web address] Date: 12/28/2023 Prepared by: Mose Minerva  Exercises - Supine Gluteal Sets  - 2 x daily - 7 x weekly - 2 sets - 10 reps - 5 seconds hold - Supine Heel Slide  - 2 - 3 x daily - 7 x weekly - 1 sets - 10 reps - Supine Short Arc Quad  - 1 x daily - 7 x weekly - 2 sets - 10 reps - Supine Active Straight Leg Raise  - 2 x daily - 7 x weekly - 1 sets - 5 reps -  Supine Heel Slide with Strap  - 2 x daily - 7 x weekly - 2 sets - 10 reps - Seated Hamstring Curl with Anchored Resistance  - 1 x daily - 4 x weekly - 2-3 sets - 10 reps - Side Stepping with Resistance at Thighs and Counter Support  - 1 x daily - 3 x weekly - 3 reps  ASSESSMENT:  CLINICAL IMPRESSION:  Pt is 12 weeks s/p R TKA making great progress in all areas.  She saw MD who gave her a new order to continue PT.  Pt has improved gait and ambulates with a heel to toe gait pattern.  She is limited with ambulation due to increased fatigue. She is able to ascend and  descend stairs with a reciprocal gait with good control with the rail.  Pt has made great progress in knee flexion ROM.  Pt was limited in extension ROM today.  R knee extension AROM improved from 3 deg at beginning of treatment to 2 deg at the end of treatment.  She has had 0 deg of extension prior.  Pt has good strength in R hip flex and abd and minimal weakness in knee extension and flexion.  Pt wants to be able to perform floor transfers.  PT worked on floor transfers and instructed pt in correct form.  Pt performed floor transfers well.  She demonstrates improved self perceived disability with LEFS improving from 44/80 to 52/80.  Pt has met all STG's and LTG's #  1,2,5.PT added a new goal for floor transfers.  Pt should benefit from cont skilled PT per protocol to address ongoing goals, improve strength, and maximize functional mobility.          OBJECTIVE IMPAIRMENTS: Abnormal gait, decreased activity tolerance, decreased endurance, decreased mobility, difficulty walking, decreased ROM, decreased strength, hypomobility, increased edema, impaired flexibility, and pain.   ACTIVITY LIMITATIONS: bending, standing, squatting, stairs, transfers, bed mobility, dressing, and locomotion level  PARTICIPATION LIMITATIONS: meal prep, cleaning, laundry, driving, shopping, and community activity  PERSONAL FACTORS: 1-2 comorbidities: L knee  arthritis, A-fib are also affecting patient's functional outcome.   REHAB POTENTIAL: Good  CLINICAL DECISION MAKING: Stable/uncomplicated  EVALUATION COMPLEXITY: Low   GOALS:   SHORT TERM GOALS:  Pt will be independent and compliant with HEP for improved pain, ROM, strength, and function.  Baseline: Goal status: MET 01/15/24 Target date:     2.  Pt will demo a good quad set and be able to perform a supine SLR independently for improved strength and stability with gait. Baseline:  Goal status: GOAL MET Target date:  01/25/24   3.  Pt will demo improved R knee AROM to 0 - 95 deg for improved mobility, stiffness, and gait.   Baseline:  Goal status: GOAL MET  10/10 Target date:  01/25/24  4.  Pt will demo improved quality of gait and ambulate with a SPC with good stability.  Baseline:  Goal status: GOAL MET  10/10 Target date:  01/25/24  5.  Pt will able to perform her normal daily transfers with no > than minimal difficulty.   Baseline:  Goal status: GOAL MET  10/10 Target date: 02/22/2024    LONG TERM GOALS: Target date: 04/08/24  Pt will demo improved R knee AROM to 0 - 115 deg for improved mobility and stiffness and for improved performance of stairs.  Baseline:  Goal status: GOAL MET  10/10  2.  Pt will ambulate with a normalized heel to toe gait without limping.  Baseline:  Goal status:  GOAL MET  11/7  3.  Pt will ambulate community distance without an AD without significant pain and difficulty.  Baseline:  Goal status: PROGRESSING  11/7  4.  Pt will be able to perform household chores without significant pain and difficulty.   Baseline:  Goal status: PROGRESSING  10/24  5.  Pt will be able to perform 4-5 steps with a reciprocal gait with the rail for improved performance of stairs.    Baseline:  Goal status:  GOAL MET  11/7  6.  Pt will demo improved strength to 4+/5 in R hip abd, 4+ to 5/5 in Hip flexion, and 5/5 in knee ext and flexion for improved  performance  of daily functional mobility and to assist with returning to recreational activities.  Baseline:  Goal status: 50% MET  7.  Pt will be able to perform floor transfers independently without significant difficulty.  Goal status:  INITIAL   PLAN:  PT FREQUENCY: 1-2x/wk  PT DURATION: 3 weeks  PLANNED INTERVENTIONS: 02835- PT Re-evaluation, 97750- Physical Performance Testing, 97110-Therapeutic exercises, 97530- Therapeutic activity, 97112- Neuromuscular re-education, 97535- Self Care, [id number]- Manual therapy, [phone]- Gait training, [phone]- Aquatic Therapy, Patient/Family education, Balance training, Stair training, Taping, Joint mobilization, Spinal mobilization, Scar mobilization, DME instructions, Cryotherapy, and Moist heat  PLAN FOR NEXT SESSION: Cont per TKA protocol.  Cont with strengthening, ROM, manual therapy.  Work on floor transfers.   Leigh Minerva III PT, DPT 03/20/24 9:09 AM

## 2024-03-21 ENCOUNTER — Ambulatory Visit (HOSPITAL_BASED_OUTPATIENT_CLINIC_OR_DEPARTMENT_OTHER): Admitting: Physical Therapy

## 2024-03-21 ENCOUNTER — Encounter (HOSPITAL_BASED_OUTPATIENT_CLINIC_OR_DEPARTMENT_OTHER): Payer: Self-pay

## 2024-03-23 ENCOUNTER — Ambulatory Visit (HOSPITAL_BASED_OUTPATIENT_CLINIC_OR_DEPARTMENT_OTHER): Payer: Self-pay | Admitting: Physical Therapy

## 2024-03-23 DIAGNOSIS — M25661 Stiffness of right knee, not elsewhere classified: Secondary | ICD-10-CM | POA: Diagnosis not present

## 2024-03-23 DIAGNOSIS — R262 Difficulty in walking, not elsewhere classified: Secondary | ICD-10-CM

## 2024-03-23 DIAGNOSIS — M6281 Muscle weakness (generalized): Secondary | ICD-10-CM | POA: Diagnosis not present

## 2024-03-23 DIAGNOSIS — M25561 Pain in right knee: Secondary | ICD-10-CM

## 2024-03-23 NOTE — Therapy (Signed)
 OUTPATIENT PHYSICAL THERAPY LOWER EXTREMITY TREATMENT             Patient Name: Dawn Haley MRN: [id number] DOB:25-Nov-1955, 68 y.o., female Today's Date: 03/24/2024  END OF SESSION:  PT End of Session - 03/23/24 1111     Visit Number 29    Number of Visits 32    Date for Recertification  04/08/24    Authorization Type BCBS MCR    PT Start Time 1105    PT Stop Time 1150    PT Time Calculation (min) 45 min    Activity Tolerance Patient tolerated treatment well    Behavior During Therapy WFL for tasks assessed/performed                           Past Medical History:  Diagnosis Date   Anxiety    Arthritis    Diverticulitis    Dyspnea    walking up hill   Dysrhythmia    PAF   Hypertension    Meniere disease of right ear    Pneumonia    Pre-diabetes    Past Surgical History:  Procedure Laterality Date   ABDOMINAL HYSTERECTOMY     APPENDECTOMY     COLON SURGERY  2022   Colectomy for colovaginal fistula & diverticulitis   CYST EXCISION Right 11/02/2015   Procedure: RIGHT INDEX EXCISION MASS ;  Surgeon: Franky Curia, MD;  Location: [facility];  Service: Orthopedics;  Laterality: Right;   CYSTOSCOPY Bilateral 11/09/2019   Procedure: CYSTOSCOPY WITH BILATERAL FIREFLY  INJECTION;  Surgeon: Devere Lonni Righter, MD;  Location: [facility];  Service: Urology;  Laterality: Bilateral;   DIAGNOSTIC LAPAROSCOPY     GYN   DILATION AND CURETTAGE OF UTERUS     TONSILLECTOMY     TOTAL KNEE ARTHROPLASTY Right 12/25/2023   Procedure: ARTHROPLASTY, KNEE, TOTAL;  Surgeon: Kay Kemps, MD;  Location: [facility];  Service: Orthopedics;  Laterality: Right;   URETHRAL DILATION     WISDOM TOOTH EXTRACTION     Patient Active Problem List   Diagnosis Date Noted   Hyperlipidemia 03/02/2024   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   Paroxysmal atrial fibrillation (HCC) 02/17/2023   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   Hypocalcemia 10/27/2019   Sigmoid diverticulitis 10/26/2019   Chest pain 06/06/2015   GERD (gastroesophageal reflux disease) 06/06/2015   Costochondritis 06/06/2015     REFERRING PROVIDER: Kay Kemps, MD   REFERRING DIAG: M17.11 (ICD-10-CM) - Unilateral primary osteoarthritis, right knee  S/p R TKA   THERAPY DIAG:  Right knee pain, unspecified chronicity  Muscle weakness (generalized)  Stiffness of right knee, not elsewhere classified  Difficulty in walking, not elsewhere classified  Rationale for Evaluation and Treatment: Rehabilitation  ONSET DATE: DOS  12/25/23  SUBJECTIVE:   SUBJECTIVE STATEMENT:  Pt is 12 weeks and 5 days s/p R TKA.   Pt reports her knee was a little sore after prior Rx and she used ice.  Pt states she had increased pain this weekend.  She was on her feet a lot and walking on uneven ground this weekend.  Pt was able to don pants in standing without holding onto something this AM. Pt used frankincense oil this AM which helps.       PERTINENT HISTORY: R TKA  12/25/23 Arthritis including L knee, A-fib, anxiety, HTN   Pt reports having a fx in R hip 20 years ago.  PAIN:  Location:  R knee Her pain was 4-5/10 this AM, though not bothering her much now unless she touches it.      PRECAUTIONS: Other: per surgical protocol for TKA    WEIGHT BEARING RESTRICTIONS: none indicated  FALLS:  Has patient fallen in last 6 months? No  LIVING ENVIRONMENT: Lives with: lives with their spouse Lives in: 1 story home Stairs: small step to enter/exit home without rail Has following equipment at home: Single point cane, Walker - 2 wheeled, and Crutches, bedside toilet, shower stool  OCCUPATION: Pt is retired.   PLOF: Independent.  Pt ambulating without an AD.   PATIENT GOALS:  Pt wants to be able to do everything.  Be able to garden.  She wants to be able to get up and down with ease and play with her grandson.     NEXT MD VISIT:    OBJECTIVE:  Note: Objective measures were completed at Evaluation unless otherwise noted.  DIAGNOSTIC FINDINGS: Pt is post op.     LOWER EXTREMITY ROM:   ROM Right eval Right 8/29 Right 01/15/24 09/08 Right  09/10 10/10 Right 10/14 Right   Hip flexion         Hip extension         Hip abduction         Hip adduction         Hip internal rotation         Hip external rotation         Knee flexion 60 deg PROM 98 deg PROM Seated AROM 100* 107 Passive  110 120 AROM 118  Knee extension 5/4  AROM/PROM 4 deg AROM Seated LAQ 19*, supine quad set with heel prop 6*   0 deg AROM 0  Ankle dorsiflexion         Ankle plantarflexion         Ankle inversion         Ankle eversion          (Blank rows = not tested)                                                                                                                                   TREATMENT:    03/23/24 Scift recumbent bike x 6 mins lvl 3-3.5 min Cybex leg press 55# 3x10-12 LF knee extension 10# 2x10 bilat LE's LF hip abd  35# 2x10  Mini squats 2x10 with UE support Marching on airex  Seated HS curls with BTB 3x10  STM to R quad in supine.  Pt received R knee flex and ext PROM in supine with OP into extension.   03/18/24 Scifit recumbent bike x 6 mins  lvl 3  R knee AROM: 3 - 123 deg before PROM R LE strength:  5/5 in hip abd and flexion.  4+/5 in knee flexion and extension  Gait:  Pt ambulates with a good heel  to toe gait pattern.  She has improved quality of gait and no limping today.  Pt states she does limp at times.  Stairs:  Pt ascended and descended stairs with a reciprocal gait with the rail.  LAQ  5#x12, 7.5#  2x10 Seated HS curls with GTB x10, BTB 2x10 TKE with BTB 2x10 Pt able to apply pressure with R knee into airex pad into wall  PT educated and demonstrated half floor to stand transfers.    Pt received R knee flexion and extension PROM with OP into extension.  LEFS:  52/80   03/15/24 LF  Recumbent bike x 5 mins lvl 5-6 Squats 2x12 with UE support on table TKE GTB x12, BTB 2x10 Lateral band walks with GTB above knees Cybex leg press  55# 3x10-12 LAQ 5#x10, 6.5# 2x10 Seated HS curls with GTB 3x10-12 Marching on airex with UE support 2x10 Heel raises on airex 2x10  Pt received R knee flexion and extension PROM with OP into extension.  03/09/24 Recumbent bike x 5 mins  lvl 3 Cybex leg press  50# 2x10, 55# x10 LAQ  5# 3x10 Seated HS curls with GTB 3x10 TKE with GTB 2x10 Lateral band walks with RTB around thighs x 1 lap with and 2 laps with GTB at rail  Pt ascended and descended stairs with a rail with a reciprocal gait x 2 reps   Lateral step ups 2x10  6 inch step Squats with UE support on rail  Pt received R knee flexion and extension PROM in supine.   Reviewed and updated HEP.  Pt received a HEP handout and was educated in correct form and appropriate frequency.   03/07/24 Recumbent bike x 5 mins  lvl 3 Cybex leg press  50# 2x10, 55# x8 LAQ  4# 2x12, 5# x10 Seated HS curls with GTB 3x10 TKE with GTB 2x10 Lateral band walks with RTB around thighs (10 steps) x 2 laps in hallway  Pt ascended and descended stairs with a rail with a step through to a reciprocal gait x 2 reps    Pt received R knee flexion and extension PROM in supine.  STM and TPR to R quad in supine  PT educated pt concerning POC.    PATIENT EDUCATION:  Education details:  post op protocol restrictions and expectations, exercise form, sx response and management, HEP, dx, prognosis, gait, and POC.  PT answered pt's questions.  Person educated: Patient Education method: Explanation, Demonstration, Tactile cues, Verbal cues Education comprehension: verbalized understanding, returned demonstration, verbal cues required, and tactile cues required  HOME EXERCISE PROGRAM: Exercises - Supine Quadricep Sets  - 2 x daily - 7 x weekly - 2 sets - 10 reps - 5 seconds hold - ANKLE PUMPS  - 7 x  weekly - Seated Passive Knee Extension  - 2-3 x daily - 7 x weekly - 2 reps - 2 minutes hold -Sitting with knee flexed for 3-5 mins comfortably at various times for 6-7 times/day  Access Code: [email or web address] URL: [email or web address] Date: 12/28/2023 Prepared by: Mose Minerva  Exercises - Supine Gluteal Sets  - 2 x daily - 7 x weekly - 2 sets - 10 reps - 5 seconds hold - Supine Heel Slide  - 2 - 3 x daily - 7 x weekly - 1 sets - 10 reps - Supine Short Arc Quad  - 1 x daily - 7 x weekly - 2 sets - 10 reps - Supine Active Straight Leg Raise  - 2 x daily -  7 x weekly - 1 sets - 5 reps - Supine Heel Slide with Strap  - 2 x daily - 7 x weekly - 2 sets - 10 reps - Seated Hamstring Curl with Anchored Resistance  - 1 x daily - 4 x weekly - 2-3 sets - 10 reps - Side Stepping with Resistance at Thighs and Counter Support  - 1 x daily - 3 x weekly - 3 reps  ASSESSMENT:  CLINICAL IMPRESSION:    Pt reports improved function including being able to don pants in standing without holding onto something this AM.  Pt is a member at [employer] and PT worked on gym exercises today.  PT educated pt in the proper set up of appropriate gym machines and positioning in the machines.  PT educated pt in correct form of gym exercises and in the appropriate exercises.  Pt tolerated gym exercises well.  Pt had tenderness with STM to quad.  PT performed knee PROM and she tolerated PROM well.  Pt should benefit from cont skilled PT per protocol to address ongoing goals, improve strength, and maximize functional mobility.     OBJECTIVE IMPAIRMENTS: Abnormal gait, decreased activity tolerance, decreased endurance, decreased mobility, difficulty walking, decreased ROM, decreased strength, hypomobility, increased edema, impaired flexibility, and pain.   ACTIVITY LIMITATIONS: bending, standing, squatting, stairs, transfers, bed mobility, dressing, and locomotion level  PARTICIPATION LIMITATIONS: meal prep,  cleaning, laundry, driving, shopping, and community activity  PERSONAL FACTORS: 1-2 comorbidities: L knee arthritis, A-fib are also affecting patient's functional outcome.   REHAB POTENTIAL: Good  CLINICAL DECISION MAKING: Stable/uncomplicated  EVALUATION COMPLEXITY: Low   GOALS:   SHORT TERM GOALS:  Pt will be independent and compliant with HEP for improved pain, ROM, strength, and function.  Baseline: Goal status: MET 01/15/24 Target date:     2.  Pt will demo a good quad set and be able to perform a supine SLR independently for improved strength and stability with gait. Baseline:  Goal status: GOAL MET Target date:  01/25/24   3.  Pt will demo improved R knee AROM to 0 - 95 deg for improved mobility, stiffness, and gait.   Baseline:  Goal status: GOAL MET  10/10 Target date:  01/25/24  4.  Pt will demo improved quality of gait and ambulate with a SPC with good stability.  Baseline:  Goal status: GOAL MET  10/10 Target date:  01/25/24  5.  Pt will able to perform her normal daily transfers with no > than minimal difficulty.   Baseline:  Goal status: GOAL MET  10/10 Target date: 02/22/2024    LONG TERM GOALS: Target date: 04/08/24  Pt will demo improved R knee AROM to 0 - 115 deg for improved mobility and stiffness and for improved performance of stairs.  Baseline:  Goal status: GOAL MET  10/10  2.  Pt will ambulate with a normalized heel to toe gait without limping.  Baseline:  Goal status:  GOAL MET  11/7  3.  Pt will ambulate community distance without an AD without significant pain and difficulty.  Baseline:  Goal status: PROGRESSING  11/7  4.  Pt will be able to perform household chores without significant pain and difficulty.   Baseline:  Goal status: PROGRESSING  10/24  5.  Pt will be able to perform 4-5 steps with a reciprocal gait with the rail for improved performance of stairs.    Baseline:  Goal status:  GOAL MET  11/7  6.  Pt  will demo  improved strength to 4+/5 in R hip abd, 4+ to 5/5 in Hip flexion, and 5/5 in knee ext and flexion for improved performance of daily functional mobility and to assist with returning to recreational activities.  Baseline:  Goal status: 50% MET  7.  Pt will be able to perform floor transfers independently without significant difficulty.  Goal status:  INITIAL   PLAN:  PT FREQUENCY: 1-2x/wk  PT DURATION: 3 weeks  PLANNED INTERVENTIONS: 02835- PT Re-evaluation, 97750- Physical Performance Testing, 97110-Therapeutic exercises, 97530- Therapeutic activity, 97112- Neuromuscular re-education, 97535- Self Care, 02859- Manual therapy, [phone]- Gait training, [phone]- Aquatic Therapy, Patient/Family education, Balance training, Stair training, Taping, Joint mobilization, Spinal mobilization, Scar mobilization, DME instructions, Cryotherapy, and Moist heat  PLAN FOR NEXT SESSION: Cont per TKA protocol.  Cont with strengthening, ROM, manual therapy.  Work on floor transfers.   Leigh Minerva III PT, DPT 03/24/24 5:00 PM

## 2024-03-24 ENCOUNTER — Encounter (HOSPITAL_BASED_OUTPATIENT_CLINIC_OR_DEPARTMENT_OTHER): Payer: Self-pay | Admitting: Physical Therapy

## 2024-03-25 ENCOUNTER — Encounter (HOSPITAL_BASED_OUTPATIENT_CLINIC_OR_DEPARTMENT_OTHER): Payer: Self-pay | Admitting: Physical Therapy

## 2024-03-25 ENCOUNTER — Ambulatory Visit (HOSPITAL_BASED_OUTPATIENT_CLINIC_OR_DEPARTMENT_OTHER): Admitting: Physical Therapy

## 2024-03-25 DIAGNOSIS — M25561 Pain in right knee: Secondary | ICD-10-CM | POA: Diagnosis not present

## 2024-03-25 DIAGNOSIS — R262 Difficulty in walking, not elsewhere classified: Secondary | ICD-10-CM

## 2024-03-25 DIAGNOSIS — M6281 Muscle weakness (generalized): Secondary | ICD-10-CM | POA: Diagnosis not present

## 2024-03-25 DIAGNOSIS — M25661 Stiffness of right knee, not elsewhere classified: Secondary | ICD-10-CM | POA: Diagnosis not present

## 2024-03-25 NOTE — Therapy (Signed)
 OUTPATIENT PHYSICAL THERAPY LOWER EXTREMITY TREATMENT             Patient Name: Dawn Haley MRN: [id number] DOB:[birth year], 68 y.o., female Today's Date: 03/25/2024  END OF SESSION:  PT End of Session - 03/25/24 1417     Visit Number 30    Number of Visits 32    Date for Recertification  04/08/24    Authorization Type BCBS MCR    Progress Note Due on Visit 30    PT Start Time 1347    PT Stop Time 1425    PT Time Calculation (min) 38 min    Activity Tolerance Patient tolerated treatment well    Behavior During Therapy WFL for tasks assessed/performed                            Past Medical History:  Diagnosis Date   Anxiety    Arthritis    Diverticulitis    Dyspnea    walking up hill   Dysrhythmia    PAF   Hypertension    Meniere disease of right ear    Pneumonia    Pre-diabetes    Past Surgical History:  Procedure Laterality Date   ABDOMINAL HYSTERECTOMY     APPENDECTOMY     COLON SURGERY  2022   Colectomy for colovaginal fistula & diverticulitis   CYST EXCISION Right 11/02/2015   Procedure: RIGHT INDEX EXCISION MASS ;  Surgeon: Franky Curia, MD;  Location: [facility];  Service: Orthopedics;  Laterality: Right;   CYSTOSCOPY Bilateral 11/09/2019   Procedure: CYSTOSCOPY WITH BILATERAL FIREFLY  INJECTION;  Surgeon: Devere Lonni Righter, MD;  Location: [facility];  Service: Urology;  Laterality: Bilateral;   DIAGNOSTIC LAPAROSCOPY     GYN   DILATION AND CURETTAGE OF UTERUS     TONSILLECTOMY     TOTAL KNEE ARTHROPLASTY Right 12/25/2023   Procedure: ARTHROPLASTY, KNEE, TOTAL;  Surgeon: Kay Kemps, MD;  Location: [facility];  Service: Orthopedics;  Laterality: Right;   URETHRAL DILATION     WISDOM TOOTH EXTRACTION     Patient Active Problem List   Diagnosis Date Noted   Hyperlipidemia 03/02/2024   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   Paroxysmal atrial fibrillation (HCC) 02/17/2023   Colovaginal fistula  11/09/2019   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   Hypocalcemia 10/27/2019   Sigmoid diverticulitis 10/26/2019   Chest pain 06/06/2015   GERD (gastroesophageal reflux disease) 06/06/2015   Costochondritis 06/06/2015     REFERRING PROVIDER: Kay Kemps, MD   REFERRING DIAG: M17.11 (ICD-10-CM) - Unilateral primary osteoarthritis, right knee  S/p R TKA   THERAPY DIAG:  Right knee pain, unspecified chronicity  Muscle weakness (generalized)  Stiffness of right knee, not elsewhere classified  Difficulty in walking, not elsewhere classified  Rationale for Evaluation and Treatment: Rehabilitation  ONSET DATE: DOS  12/25/23  SUBJECTIVE:   SUBJECTIVE STATEMENT:  Feeling tight today but good, I did it to myself overdoing it. MD was very happy with my progress        PERTINENT HISTORY: R TKA  12/25/23 Arthritis including L knee, A-fib, anxiety, HTN   Pt reports having a fx in R hip 20 years ago.  PAIN:  Location:  R knee 0/10 now      PRECAUTIONS: Other: per surgical protocol for TKA    WEIGHT BEARING RESTRICTIONS: none indicated  FALLS:  Has patient fallen in last 6 months? No  LIVING ENVIRONMENT: Lives with:  lives with their spouse Lives in: 1 story home Stairs: small step to enter/exit home without rail Has following equipment at home: Single point cane, Walker - 2 wheeled, and Crutches, bedside toilet, shower stool  OCCUPATION: Pt is retired.   PLOF: Independent.  Pt ambulating without an AD.   PATIENT GOALS:  Pt wants to be able to do everything.  Be able to garden.  She wants to be able to get up and down with ease and play with her grandson.     NEXT MD VISIT:   OBJECTIVE:  Note: Objective measures were completed at Evaluation unless otherwise noted.  DIAGNOSTIC FINDINGS: Pt is post op.     LOWER EXTREMITY ROM:   ROM Right eval Right 8/29 Right 01/15/24 09/08 Right  09/10 10/10 Right 10/14 Right   Hip flexion         Hip extension          Hip abduction         Hip adduction         Hip internal rotation         Hip external rotation         Knee flexion 60 deg PROM 98 deg PROM Seated AROM 100* 107 Passive  110 120 AROM 118  Knee extension 5/4  AROM/PROM 4 deg AROM Seated LAQ 19*, supine quad set with heel prop 6*   0 deg AROM 0  Ankle dorsiflexion         Ankle plantarflexion         Ankle inversion         Ankle eversion          (Blank rows = not tested)                                                                                                                                   TREATMENT:     03/25/24  Scifit bike seat 10 full rotations L4 x8 minutes  Shuttle LE press 75# 2x12  Shuttle LE press 31# 2x12 B Floor to stand x2   Forward step ups 8 inch box x10 R Lateral step ups 8 inch box x10 R Forward step downs 4 inch box x10 R  Tandem stance blue foam pad 3x30 seconds B Tandem walks forward and backward x4 laps at bar  Ipsilateral and cross midline cone taps from blue foam pad Standing marches blue foam pad    03/23/24 Scift recumbent bike x 6 mins lvl 3-3.5 min Cybex leg press 55# 3x10-12 LF knee extension 10# 2x10 bilat LE's LF hip abd  35# 2x10  Mini squats 2x10 with UE support Marching on airex  Seated HS curls with BTB 3x10  STM to R quad in supine.  Pt received R knee flex and ext PROM in supine with OP into extension.       PATIENT EDUCATION:  Education details:  post op  protocol restrictions and expectations, exercise form, sx response and management, HEP, dx, prognosis, gait, and POC.  PT answered pt's questions.  Person educated: Patient Education method: Explanation, Demonstration, Tactile cues, Verbal cues Education comprehension: verbalized understanding, returned demonstration, verbal cues required, and tactile cues required  HOME EXERCISE PROGRAM: Exercises - Supine Quadricep Sets  - 2 x daily - 7 x weekly - 2 sets - 10 reps - 5 seconds hold - ANKLE PUMPS  - 7 x  weekly - Seated Passive Knee Extension  - 2-3 x daily - 7 x weekly - 2 reps - 2 minutes hold -Sitting with knee flexed for 3-5 mins comfortably at various times for 6-7 times/day  Access Code: [email or web address] URL: [email or web address] Date: 12/28/2023 Prepared by: Mose Minerva  Exercises - Supine Gluteal Sets  - 2 x daily - 7 x weekly - 2 sets - 10 reps - 5 seconds hold - Supine Heel Slide  - 2 - 3 x daily - 7 x weekly - 1 sets - 10 reps - Supine Short Arc Quad  - 1 x daily - 7 x weekly - 2 sets - 10 reps - Supine Active Straight Leg Raise  - 2 x daily - 7 x weekly - 1 sets - 5 reps - Supine Heel Slide with Strap  - 2 x daily - 7 x weekly - 2 sets - 10 reps - Seated Hamstring Curl with Anchored Resistance  - 1 x daily - 4 x weekly - 2-3 sets - 10 reps - Side Stepping with Resistance at Thighs and Counter Support  - 1 x daily - 3 x weekly - 3 reps  ASSESSMENT:  CLINICAL IMPRESSION:    Arrived today doing well, we warmed up on the bike and practiced floor to stand- did very well with this. Otherwise progressed functional strengthening and balance based tasks this visit. Ready for planned DC after another several visits.     OBJECTIVE IMPAIRMENTS: Abnormal gait, decreased activity tolerance, decreased endurance, decreased mobility, difficulty walking, decreased ROM, decreased strength, hypomobility, increased edema, impaired flexibility, and pain.   ACTIVITY LIMITATIONS: bending, standing, squatting, stairs, transfers, bed mobility, dressing, and locomotion level  PARTICIPATION LIMITATIONS: meal prep, cleaning, laundry, driving, shopping, and community activity  PERSONAL FACTORS: 1-2 comorbidities: L knee arthritis, A-fib are also affecting patient's functional outcome.   REHAB POTENTIAL: Good  CLINICAL DECISION MAKING: Stable/uncomplicated  EVALUATION COMPLEXITY: Low   GOALS:   SHORT TERM GOALS:  Pt will be independent and compliant with HEP for improved pain, ROM,  strength, and function.  Baseline: Goal status: MET 01/15/24 Target date:     2.  Pt will demo a good quad set and be able to perform a supine SLR independently for improved strength and stability with gait. Baseline:  Goal status: GOAL MET Target date:  01/25/24   3.  Pt will demo improved R knee AROM to 0 - 95 deg for improved mobility, stiffness, and gait.   Baseline:  Goal status: GOAL MET  10/10 Target date:  01/25/24  4.  Pt will demo improved quality of gait and ambulate with a SPC with good stability.  Baseline:  Goal status: GOAL MET  10/10 Target date:  01/25/24  5.  Pt will able to perform her normal daily transfers with no > than minimal difficulty.   Baseline:  Goal status: GOAL MET  10/10 Target date: 02/22/2024    LONG TERM GOALS: Target date: 04/08/24  Pt will demo improved R knee AROM to  0 - 115 deg for improved mobility and stiffness and for improved performance of stairs.  Baseline:  Goal status: GOAL MET  10/10  2.  Pt will ambulate with a normalized heel to toe gait without limping.  Baseline:  Goal status:  GOAL MET  11/7  3.  Pt will ambulate community distance without an AD without significant pain and difficulty.  Baseline:  Goal status: PROGRESSING  11/7  4.  Pt will be able to perform household chores without significant pain and difficulty.   Baseline:  Goal status: PROGRESSING  10/24  5.  Pt will be able to perform 4-5 steps with a reciprocal gait with the rail for improved performance of stairs.    Baseline:  Goal status:  GOAL MET  11/7  6.  Pt will demo improved strength to 4+/5 in R hip abd, 4+ to 5/5 in Hip flexion, and 5/5 in knee ext and flexion for improved performance of daily functional mobility and to assist with returning to recreational activities.  Baseline:  Goal status: 50% MET  7.  Pt will be able to perform floor transfers independently without significant difficulty.  Goal status:  INITIAL   PLAN:  PT FREQUENCY:  1-2x/wk  PT DURATION: 3 weeks  PLANNED INTERVENTIONS: 02835- PT Re-evaluation, 97750- Physical Performance Testing, 97110-Therapeutic exercises, 97530- Therapeutic activity, 97112- Neuromuscular re-education, 97535- Self Care, 02859- Manual therapy, [phone]- Gait training, [phone]- Aquatic Therapy, Patient/Family education, Balance training, Stair training, Taping, Joint mobilization, Spinal mobilization, Scar mobilization, DME instructions, Cryotherapy, and Moist heat  PLAN FOR NEXT SESSION: Cont per TKA protocol.  Cont with strengthening, ROM, manual therapy.   Balance   Josette Rough, PT, DPT 03/25/24 2:27 PM

## 2024-03-28 DIAGNOSIS — R42 Dizziness and giddiness: Secondary | ICD-10-CM | POA: Diagnosis not present

## 2024-03-30 ENCOUNTER — Encounter (HOSPITAL_BASED_OUTPATIENT_CLINIC_OR_DEPARTMENT_OTHER): Payer: Self-pay | Admitting: Physical Therapy

## 2024-03-30 ENCOUNTER — Encounter (HOSPITAL_BASED_OUTPATIENT_CLINIC_OR_DEPARTMENT_OTHER): Payer: Self-pay

## 2024-03-31 DIAGNOSIS — R42 Dizziness and giddiness: Secondary | ICD-10-CM | POA: Diagnosis not present

## 2024-04-01 ENCOUNTER — Ambulatory Visit (HOSPITAL_BASED_OUTPATIENT_CLINIC_OR_DEPARTMENT_OTHER): Payer: Self-pay | Admitting: Physical Therapy

## 2024-04-01 ENCOUNTER — Encounter (HOSPITAL_BASED_OUTPATIENT_CLINIC_OR_DEPARTMENT_OTHER): Payer: Self-pay

## 2024-04-04 ENCOUNTER — Ambulatory Visit (HOSPITAL_BASED_OUTPATIENT_CLINIC_OR_DEPARTMENT_OTHER): Payer: Self-pay | Admitting: Physical Therapy

## 2024-04-04 ENCOUNTER — Telehealth: Payer: Self-pay | Admitting: Cardiology

## 2024-04-04 ENCOUNTER — Encounter (HOSPITAL_BASED_OUTPATIENT_CLINIC_OR_DEPARTMENT_OTHER): Payer: Self-pay | Admitting: Physical Therapy

## 2024-04-04 DIAGNOSIS — M25561 Pain in right knee: Secondary | ICD-10-CM | POA: Diagnosis not present

## 2024-04-04 DIAGNOSIS — R262 Difficulty in walking, not elsewhere classified: Secondary | ICD-10-CM | POA: Diagnosis not present

## 2024-04-04 DIAGNOSIS — M25661 Stiffness of right knee, not elsewhere classified: Secondary | ICD-10-CM

## 2024-04-04 DIAGNOSIS — M6281 Muscle weakness (generalized): Secondary | ICD-10-CM | POA: Diagnosis not present

## 2024-04-04 NOTE — Therapy (Signed)
 OUTPATIENT PHYSICAL THERAPY LOWER EXTREMITY TREATMENT             Patient Name: Dawn Haley MRN: [id number] DOB:Oct 07, 1955, 68 y.o., female Today's Date: 04/05/2024  END OF SESSION:  PT End of Session - 04/04/24 0855     Visit Number 31    Number of Visits 32    Date for Recertification  04/08/24    Authorization Type BCBS MCR    PT Start Time 0805    PT Stop Time 0839    PT Time Calculation (min) 34 min    Activity Tolerance Patient tolerated treatment well    Behavior During Therapy WFL for tasks assessed/performed                             Past Medical History:  Diagnosis Date   Anxiety    Arthritis    Diverticulitis    Dyspnea    walking up hill   Dysrhythmia    PAF   Hypertension    Meniere disease of right ear    Pneumonia    Pre-diabetes    Past Surgical History:  Procedure Laterality Date   ABDOMINAL HYSTERECTOMY     APPENDECTOMY     COLON SURGERY  2022   Colectomy for colovaginal fistula & diverticulitis   CYST EXCISION Right 11/02/2015   Procedure: RIGHT INDEX EXCISION MASS ;  Surgeon: Franky Curia, MD;  Location: [facility];  Service: Orthopedics;  Laterality: Right;   CYSTOSCOPY Bilateral 11/09/2019   Procedure: CYSTOSCOPY WITH BILATERAL FIREFLY  INJECTION;  Surgeon: Devere Lonni Righter, MD;  Location: [facility];  Service: Urology;  Laterality: Bilateral;   DIAGNOSTIC LAPAROSCOPY     GYN   DILATION AND CURETTAGE OF UTERUS     TONSILLECTOMY     TOTAL KNEE ARTHROPLASTY Right 12/25/2023   Procedure: ARTHROPLASTY, KNEE, TOTAL;  Surgeon: Kay Kemps, MD;  Location: [facility];  Service: Orthopedics;  Laterality: Right;   URETHRAL DILATION     WISDOM TOOTH EXTRACTION     Patient Active Problem List   Diagnosis Date Noted   Hyperlipidemia 03/02/2024   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   Paroxysmal atrial fibrillation (HCC) 02/17/2023   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   Hypocalcemia 10/27/2019   Sigmoid diverticulitis 10/26/2019   Chest pain 06/06/2015   GERD (gastroesophageal reflux disease) 06/06/2015   Costochondritis 06/06/2015     REFERRING PROVIDER: Kay Kemps, MD   REFERRING DIAG: M17.11 (ICD-10-CM) - Unilateral primary osteoarthritis, right knee  S/p R TKA   THERAPY DIAG:  Right knee pain, unspecified chronicity  Stiffness of right knee, not elsewhere classified  Difficulty in walking, not elsewhere classified  Muscle weakness (generalized)  Rationale for Evaluation and Treatment: Rehabilitation  ONSET DATE: DOS  12/25/23  SUBJECTIVE:   SUBJECTIVE STATEMENT:  Pt had vertigo last week and had to miss last week's appointments.  Pt had multiple Epley maneuvers which didn't help.  Pt used a patch and is on prednisone currently.  That has resolved now.  Pt states she went into A-fib last night.  Pt spoke to cardiology and they stated it was fine to come to PT.  Pt has taken metoprolol  twice and has been monitoring her HR.  She states she feels fine now, and not having any sx's.   Pt states she hasn't used the ice machine in a week.  She can do things now that she  was not doing.  Pt is able to go up/down step ladder.         PERTINENT HISTORY: R TKA  12/25/23 Arthritis including L knee, A-fib, anxiety, HTN   Pt reports having a fx in R hip 20 years ago.  PAIN:  Location:  R knee 0/10 now      PRECAUTIONS: Other: per surgical protocol for TKA    WEIGHT BEARING RESTRICTIONS: none indicated  FALLS:  Has patient fallen in last 6 months? No  LIVING ENVIRONMENT: Lives with: lives with their spouse Lives in: 1 story home Stairs: small step to enter/exit home without rail Has following equipment at home: Single point cane, Walker - 2 wheeled, and Crutches, bedside toilet, shower stool  OCCUPATION: Pt is retired.   PLOF: Independent.  Pt ambulating without an AD.   PATIENT GOALS:  Pt wants to be able to do  everything.  Be able to garden.  She wants to be able to get up and down with ease and play with her grandson.     NEXT MD VISIT:   OBJECTIVE:  Note: Objective measures were completed at Evaluation unless otherwise noted.  DIAGNOSTIC FINDINGS: Pt is post op.     LOWER EXTREMITY ROM:   ROM Right eval Right 8/29 Right 01/15/24 09/08 Right  09/10 10/10 Right 10/14 Right   Hip flexion         Hip extension         Hip abduction         Hip adduction         Hip internal rotation         Hip external rotation         Knee flexion 60 deg PROM 98 deg PROM Seated AROM 100* 107 Passive  110 120 AROM 118  Knee extension 5/4  AROM/PROM 4 deg AROM Seated LAQ 19*, supine quad set with heel prop 6*   0 deg AROM 0  Ankle dorsiflexion         Ankle plantarflexion         Ankle inversion         Ankle eversion          (Blank rows = not tested)                                                                                                                                   TREATMENT:     04/04/24 Reviewed pt presentation.  HR: 54-58.  Pt received R knee flexion and extension PROM in supine with manual OP into extension.  Pt received STM to quad and ITB in supine.  PT provided education concerning gym and home exercises and appropriate frequency though did not perform any exercises today.  HR:  56-58 at end of treatment   03/25/24  Scifit bike seat 10 full rotations L4 x8 minutes  Shuttle LE press 75# 2x12  Shuttle  LE press 31# 2x12 B Floor to stand x2   Forward step ups 8 inch box x10 R Lateral step ups 8 inch box x10 R Forward step downs 4 inch box x10 R  Tandem stance blue foam pad 3x30 seconds B Tandem walks forward and backward x4 laps at bar  Ipsilateral and cross midline cone taps from blue foam pad Standing marches blue foam pad    03/23/24 Scift recumbent bike x 6 mins lvl 3-3.5 min Cybex leg press 55# 3x10-12 LF knee extension 10# 2x10 bilat LE's LF  hip abd  35# 2x10  Mini squats 2x10 with UE support Marching on airex  Seated HS curls with BTB 3x10  STM to R quad in supine.  Pt received R knee flex and ext PROM in supine with OP into extension.       PATIENT EDUCATION:  Education details:  post op protocol restrictions and expectations, exercise form, sx response and management, HEP, dx, prognosis, gait, and POC.  PT answered pt's questions.  Person educated: Patient Education method: Explanation, Demonstration, Tactile cues, Verbal cues Education comprehension: verbalized understanding, returned demonstration, verbal cues required, and tactile cues required  HOME EXERCISE PROGRAM: Exercises - Supine Quadricep Sets  - 2 x daily - 7 x weekly - 2 sets - 10 reps - 5 seconds hold - ANKLE PUMPS  - 7 x weekly - Seated Passive Knee Extension  - 2-3 x daily - 7 x weekly - 2 reps - 2 minutes hold -Sitting with knee flexed for 3-5 mins comfortably at various times for 6-7 times/day  Access Code: [email or web address] URL: [email or web address] Date: 12/28/2023 Prepared by: Mose Minerva  Exercises - Supine Gluteal Sets  - 2 x daily - 7 x weekly - 2 sets - 10 reps - 5 seconds hold - Supine Heel Slide  - 2 - 3 x daily - 7 x weekly - 1 sets - 10 reps - Supine Short Arc Quad  - 1 x daily - 7 x weekly - 2 sets - 10 reps - Supine Active Straight Leg Raise  - 2 x daily - 7 x weekly - 1 sets - 5 reps - Supine Heel Slide with Strap  - 2 x daily - 7 x weekly - 2 sets - 10 reps - Seated Hamstring Curl with Anchored Resistance  - 1 x daily - 4 x weekly - 2-3 sets - 10 reps - Side Stepping with Resistance at Thighs and Counter Support  - 1 x daily - 3 x weekly - 3 reps  ASSESSMENT:  CLINICAL IMPRESSION:    Pt reports her knee is doing better and she was able to go down a step ladder.  Pt reports having A-fib last night.  She spoke with cardiology and has taken metoprolol  twice.  Pt has been monitoring her HR and states she is fine now.  Pt  reports currently not having any A-fib sx's.  PT assessed HR.  Pt states her HR is a little low, but is normally around 60.  PT did not perform any exercises today due to recent bout of A-fib.  PT just performed PROM and STM to R LE which pt tolerated well.  Pt has improved soft tissue tightness in R quad.  PT did discuss her long term exercise program including HEP and gym exercises and educated pt concerning appropriate frequency and specific exercises.  PT educated pt concerning POC and answered pt's questions.  Pt responded well to treatment and stating her quad thigh  felt better after STM.  She had no c/o's after treatment.  Pt states she is going to call the A-fib clinic today.      OBJECTIVE IMPAIRMENTS: Abnormal gait, decreased activity tolerance, decreased endurance, decreased mobility, difficulty walking, decreased ROM, decreased strength, hypomobility, increased edema, impaired flexibility, and pain.   ACTIVITY LIMITATIONS: bending, standing, squatting, stairs, transfers, bed mobility, dressing, and locomotion level  PARTICIPATION LIMITATIONS: meal prep, cleaning, laundry, driving, shopping, and community activity  PERSONAL FACTORS: 1-2 comorbidities: L knee arthritis, A-fib are also affecting patient's functional outcome.   REHAB POTENTIAL: Good  CLINICAL DECISION MAKING: Stable/uncomplicated  EVALUATION COMPLEXITY: Low   GOALS:   SHORT TERM GOALS:  Pt will be independent and compliant with HEP for improved pain, ROM, strength, and function.  Baseline: Goal status: MET 01/15/24 Target date:     2.  Pt will demo a good quad set and be able to perform a supine SLR independently for improved strength and stability with gait. Baseline:  Goal status: GOAL MET Target date:  01/25/24   3.  Pt will demo improved R knee AROM to 0 - 95 deg for improved mobility, stiffness, and gait.   Baseline:  Goal status: GOAL MET  10/10 Target date:  01/25/24  4.  Pt will demo improved  quality of gait and ambulate with a SPC with good stability.  Baseline:  Goal status: GOAL MET  10/10 Target date:  01/25/24  5.  Pt will able to perform her normal daily transfers with no > than minimal difficulty.   Baseline:  Goal status: GOAL MET  10/10 Target date: 02/22/2024    LONG TERM GOALS: Target date: 04/08/24  Pt will demo improved R knee AROM to 0 - 115 deg for improved mobility and stiffness and for improved performance of stairs.  Baseline:  Goal status: GOAL MET  10/10  2.  Pt will ambulate with a normalized heel to toe gait without limping.  Baseline:  Goal status:  GOAL MET  11/7  3.  Pt will ambulate community distance without an AD without significant pain and difficulty.  Baseline:  Goal status: PROGRESSING  11/7  4.  Pt will be able to perform household chores without significant pain and difficulty.   Baseline:  Goal status: PROGRESSING  10/24  5.  Pt will be able to perform 4-5 steps with a reciprocal gait with the rail for improved performance of stairs.    Baseline:  Goal status:  GOAL MET  11/7  6.  Pt will demo improved strength to 4+/5 in R hip abd, 4+ to 5/5 in Hip flexion, and 5/5 in knee ext and flexion for improved performance of daily functional mobility and to assist with returning to recreational activities.  Baseline:  Goal status: 50% MET  7.  Pt will be able to perform floor transfers independently without significant difficulty.  Goal status:  INITIAL   PLAN:  PT FREQUENCY: 1-2x/wk  PT DURATION: 3 weeks  PLANNED INTERVENTIONS: 02835- PT Re-evaluation, 97750- Physical Performance Testing, 97110-Therapeutic exercises, 97530- Therapeutic activity, 97112- Neuromuscular re-education, 97535- Self Care, 02859- Manual therapy, [phone]- Gait training, [phone]- Aquatic Therapy, Patient/Family education, Balance training, Stair training, Taping, Joint mobilization, Spinal mobilization, Scar mobilization, DME instructions, Cryotherapy, and Moist  heat  PLAN FOR NEXT SESSION: Cont per TKA protocol.  Cont with strengthening, ROM, manual therapy.  Possible discharge next treatment.  Monitor HR.   Leigh Minerva III PT, DPT 04/05/24 8:25 AM

## 2024-04-04 NOTE — Telephone Encounter (Signed)
 Outpatient service line  Patient called reporting that she thinks she had another episode of atrial fibrillation last night.  Mildly symptomatic with palpitations, HR 120s but denies any chest pain or shortness of breath.  She took 1 dose of metoprolol  25 mg and had improvement in rates back down to 60-80, I talked with her and had her feel her radial pulse and it sounds like she is in a regular rhythm.  She has known paroxysmal atrial fibrillation and not on anticoagulation given low CHA2DS2-VASc.  Discussed ablation and follow-up appointment and she still wants to think about this.  Since she has no other symptoms and I suspect that she is back in sinus rhythm I recommended continued monitoring with a pulse ox in palpation of radial pulse.  Directed her that if she has paroxysmal she can use her metoprolol  25 mg as needed up to every 6 hours.  Told her to call us  should she have persistent episodes and may be could consider ablation in the future.

## 2024-04-05 ENCOUNTER — Encounter (HOSPITAL_BASED_OUTPATIENT_CLINIC_OR_DEPARTMENT_OTHER): Payer: Self-pay | Admitting: Physical Therapy

## 2024-04-05 ENCOUNTER — Ambulatory Visit (HOSPITAL_BASED_OUTPATIENT_CLINIC_OR_DEPARTMENT_OTHER): Payer: Self-pay | Admitting: Physical Therapy

## 2024-04-05 DIAGNOSIS — M25561 Pain in right knee: Secondary | ICD-10-CM

## 2024-04-05 DIAGNOSIS — M6281 Muscle weakness (generalized): Secondary | ICD-10-CM

## 2024-04-05 DIAGNOSIS — M25661 Stiffness of right knee, not elsewhere classified: Secondary | ICD-10-CM | POA: Diagnosis not present

## 2024-04-05 DIAGNOSIS — R262 Difficulty in walking, not elsewhere classified: Secondary | ICD-10-CM

## 2024-04-05 NOTE — Therapy (Incomplete)
 OUTPATIENT PHYSICAL THERAPY LOWER EXTREMITY TREATMENT             Patient Name: Dawn Haley MRN: [id number] DOB:09/11/1955, 68 y.o., female Today's Date: 04/06/2024  END OF SESSION:  PT End of Session - 04/05/24 1506     Visit Number 32    Number of Visits 32    Date for Recertification  04/08/24    Authorization Type BCBS MCR    PT Start Time 1500    PT Stop Time 1538    PT Time Calculation (min) 38 min    Activity Tolerance Patient tolerated treatment well    Behavior During Therapy WFL for tasks assessed/performed                              Past Medical History:  Diagnosis Date   Anxiety    Arthritis    Diverticulitis    Dyspnea    walking up hill   Dysrhythmia    PAF   Hypertension    Meniere disease of right ear    Pneumonia    Pre-diabetes    Past Surgical History:  Procedure Laterality Date   ABDOMINAL HYSTERECTOMY     APPENDECTOMY     COLON SURGERY  2022   Colectomy for colovaginal fistula & diverticulitis   CYST EXCISION Right 11/02/2015   Procedure: RIGHT INDEX EXCISION MASS ;  Surgeon: Franky Curia, MD;  Location: [facility];  Service: Orthopedics;  Laterality: Right;   CYSTOSCOPY Bilateral 11/09/2019   Procedure: CYSTOSCOPY WITH BILATERAL FIREFLY  INJECTION;  Surgeon: Devere Lonni Righter, MD;  Location: [facility];  Service: Urology;  Laterality: Bilateral;   DIAGNOSTIC LAPAROSCOPY     GYN   DILATION AND CURETTAGE OF UTERUS     TONSILLECTOMY     TOTAL KNEE ARTHROPLASTY Right 12/25/2023   Procedure: ARTHROPLASTY, KNEE, TOTAL;  Surgeon: Kay Kemps, MD;  Location: [facility];  Service: Orthopedics;  Laterality: Right;   URETHRAL DILATION     WISDOM TOOTH EXTRACTION     Patient Active Problem List   Diagnosis Date Noted   Hyperlipidemia 03/02/2024   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   Paroxysmal atrial fibrillation (HCC) 02/17/2023   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   Hypocalcemia 10/27/2019   Sigmoid diverticulitis 10/26/2019   Chest pain 06/06/2015   GERD (gastroesophageal reflux disease) 06/06/2015   Costochondritis 06/06/2015     REFERRING PROVIDER: Kay Kemps, MD   REFERRING DIAG: M17.11 (ICD-10-CM) - Unilateral primary osteoarthritis, right knee  S/p R TKA   THERAPY DIAG:  Right knee pain, unspecified chronicity  Muscle weakness (generalized)  Stiffness of right knee, not elsewhere classified  Difficulty in walking, not elsewhere classified  Rationale for Evaluation and Treatment: Rehabilitation  ONSET DATE: DOS  12/25/23  SUBJECTIVE:   SUBJECTIVE STATEMENT:  Pt has been busy running around all day doing things for Thanksgiving.  Pt states maybe that's why her HR was elevated.  Pt denies having any A-fib sx's during the day yesterday and none today.  Pt is appreciative of PT.       PERTINENT HISTORY: R TKA  12/25/23 Arthritis including L knee, A-fib, anxiety, HTN   Pt reports having a fx in R hip 20 years ago.  PAIN:  Location:  R knee 2/10      PRECAUTIONS: Other: per surgical protocol for TKA    WEIGHT BEARING RESTRICTIONS: none indicated  FALLS:  Has patient fallen in last 6 months? No  LIVING ENVIRONMENT: Lives with: lives with their spouse Lives in: 1 story home Stairs: small step to enter/exit home without rail Has following equipment at home: Single point cane, Walker - 2 wheeled, and Crutches, bedside toilet, shower stool  OCCUPATION: Pt is retired.   PLOF: Independent.  Pt ambulating without an AD.   PATIENT GOALS:  Pt wants to be able to do everything.  Be able to garden.  She wants to be able to get up and down with ease and play with her grandson.     NEXT MD VISIT:   OBJECTIVE:  Note: Objective measures were completed at Evaluation unless otherwise noted.  DIAGNOSTIC FINDINGS: Pt is post op.     LOWER EXTREMITY ROM:   ROM Right eval Right 8/29 Right 01/15/24 09/08 Right   09/10 10/10 Right 10/14 Right   Hip flexion         Hip extension         Hip abduction         Hip adduction         Hip internal rotation         Hip external rotation         Knee flexion 60 deg PROM 98 deg PROM Seated AROM 100* 107 Passive  110 120 AROM 118  Knee extension 5/4  AROM/PROM 4 deg AROM Seated LAQ 19*, supine quad set with heel prop 6*   0 deg AROM 0  Ankle dorsiflexion         Ankle plantarflexion         Ankle inversion         Ankle eversion          (Blank rows = not tested)                                                                                                                                   TREATMENT:    04/05/24 Reviewed HEP and gym program.  PT educated pt in correct set up and positioning of equipment.   HR: 76-78.  A few mins later:  73  Recumbent bike x 5 mins, lvl 5 LF hip abd 35# x12, 45#x10  LF knee extension 10# x10 bilat LE's  Cybex leg press 55# 2x10  Pt received a handout consisting of gym exercises, correct set up, appropriate resistance, and appropriate frequency.  Pt performed floor to stand transfer independently.   LEFS:  54/80  04/04/24 Reviewed pt presentation.  HR:  54-58  Pt received R knee flexion and extension PROM in supine with manual OP into extension.  Pt received STM to quad and ITB in supine.  PT provided education concerning gym and home exercises and appropriate frequency though did not perform any exercises today.  HR:  56-58 at end of treatment   03/25/24  Scifit bike seat 10 full rotations L4  x8 minutes  Shuttle LE press 75# 2x12  Shuttle LE press 31# 2x12 B Floor to stand x2   Forward step ups 8 inch box x10 R Lateral step ups 8 inch box x10 R Forward step downs 4 inch box x10 R  Tandem stance blue foam pad 3x30 seconds B Tandem walks forward and backward x4 laps at bar  Ipsilateral and cross midline cone taps from blue foam pad Standing marches blue foam pad    03/23/24 Scift  recumbent bike x 6 mins lvl 3-3.5 min Cybex leg press 55# 3x10-12 LF knee extension 10# 2x10 bilat LE's LF hip abd  35# 2x10  Mini squats 2x10 with UE support Marching on airex  Seated HS curls with BTB 3x10  STM to R quad in supine.  Pt received R knee flex and ext PROM in supine with OP into extension.       PATIENT EDUCATION:  Education details:  exercise form, sx response and management, HEP, dx, gym program, and POC.  PT answered pt's questions.  Person educated: Patient Education method: Explanation, Demonstration, Verbal cues, handout Education comprehension: verbalized understanding, returned demonstration, verbal cues required  HOME EXERCISE PROGRAM: Access Code: [email or web address] URL: [email or web address] Date: 12/28/2023 Prepared by: Mose Minerva  Gym Program: Recumbent bike Cybex leg press  1-2x/wk, 2-3 set of 10 reps LF Hip abd  1-2x/wk 2-3 sets of 10 reps  ASSESSMENT:  CLINICAL IMPRESSION:    Pt has made great progress in all areas including ROM, strength, mobility, gait, and function.  She presents to treatment denying any A-fib sx's or issues today.  Pt is ambulating well.  She is able to perform stairs with a reciprocal gait with the rail.  She reports being able to go up and down a step ladder.  Pt performs floor to stand transfers independently.  She demonstrates improved R knee flexion and extension strength to 5/5 MMT.  PT thoroughly educated pt concerning gym program and HEP.  PT reviewed HEP and educated pt with correct exercises and appropriate frequency.  PT instructed pt in correct gym exercises, proper set up, appropriate resistance, and appropriate frequency.  PT gave pt a handout.  PT instructed pt to continue with LAQ at home with ankle weights instead of LF knee extension in the gym at this time.  PT educated pt with proper progression.  She is independent with HEP and gym program.  Pt tolerated treatment well and had no c/o's after  treatment.  Pt met all goals except long term of household chores.  Pt's LEFS minimally improved by 2 points since 11/7 though improved by 10 points from 10/10.  Pt is ready for discharge.     OBJECTIVE IMPAIRMENTS: Abnormal gait, decreased activity tolerance, decreased endurance, decreased mobility, difficulty walking, decreased ROM, decreased strength, hypomobility, increased edema, impaired flexibility, and pain.   ACTIVITY LIMITATIONS: bending, standing, squatting, stairs, transfers, bed mobility, dressing, and locomotion level  PARTICIPATION LIMITATIONS: meal prep, cleaning, laundry, driving, shopping, and community activity  PERSONAL FACTORS: 1-2 comorbidities: L knee arthritis, A-fib are also affecting patient's functional outcome.   REHAB POTENTIAL: Good  CLINICAL DECISION MAKING: Stable/uncomplicated  EVALUATION COMPLEXITY: Low   GOALS:   SHORT TERM GOALS:  Pt will be independent and compliant with HEP for improved pain, ROM, strength, and function.  Baseline: Goal status: MET 01/15/24 Target date:     2.  Pt will demo a good quad set and be able to perform a supine SLR independently for improved strength and  stability with gait. Baseline:  Goal status: GOAL MET Target date:  01/25/24   3.  Pt will demo improved R knee AROM to 0 - 95 deg for improved mobility, stiffness, and gait.   Baseline:  Goal status: GOAL MET  10/10 Target date:  01/25/24  4.  Pt will demo improved quality of gait and ambulate with a SPC with good stability.  Baseline:  Goal status: GOAL MET  10/10 Target date:  01/25/24  5.  Pt will able to perform her normal daily transfers with no > than minimal difficulty.   Baseline:  Goal status: GOAL MET  10/10 Target date: 02/22/2024    LONG TERM GOALS: Target date: 04/08/24  Pt will demo improved R knee AROM to 0 - 115 deg for improved mobility and stiffness and for improved performance of stairs.  Baseline:  Goal status: GOAL MET  10/10  2.   Pt will ambulate with a normalized heel to toe gait without limping.  Baseline:  Goal status:  GOAL MET  11/7  3.  Pt will ambulate community distance without an AD without significant pain and difficulty.  Baseline:  Goal status: GOAL MET  11/25  4.  Pt will be able to perform household chores without significant pain and difficulty.   Baseline:  Goal status: PROGRESSING  10/24  5.  Pt will be able to perform 4-5 steps with a reciprocal gait with the rail for improved performance of stairs.    Baseline:  Goal status:  GOAL MET  11/7  6.  Pt will demo improved strength to 4+/5 in R hip abd, 4+ to 5/5 in Hip flexion, and 5/5 in knee ext and flexion for improved performance of daily functional mobility and to assist with returning to recreational activities.  Baseline:  Goal status: GOAL MET  11/25  7.  Pt will be able to perform floor transfers independently without significant difficulty.  Goal status:  GOAL MET  11/25   PLAN:   PLANNED INTERVENTIONS: [id number]- PT Re-evaluation, 97750- Physical Performance Testing, 97110-Therapeutic exercises, 97530- Therapeutic activity, 97112- Neuromuscular re-education, 97535- Self Care, 02859- Manual therapy, [phone]- Gait training, [phone]- Aquatic Therapy, Patient/Family education, Balance training, Stair training, Taping, Joint mobilization, Spinal mobilization, Scar mobilization, DME instructions, Cryotherapy, and Moist heat  PLAN FOR NEXT SESSION:  Pt to be discharged from skilled PT due to meeting all goals except long term goal #5.  She is agreeable with discharge.  She is independent with HEP and gym program.   PHYSICAL THERAPY DISCHARGE SUMMARY  Visits from Start of Care: 32  Current functional level related to goals / functional outcomes: See above   Remaining deficits: See above   Education / Equipment: See above     Leigh Minerva III PT, DPT 04/06/24 9:52 PM

## 2024-04-06 ENCOUNTER — Encounter (HOSPITAL_BASED_OUTPATIENT_CLINIC_OR_DEPARTMENT_OTHER): Admitting: Physical Therapy

## 2024-04-06 ENCOUNTER — Encounter (HOSPITAL_BASED_OUTPATIENT_CLINIC_OR_DEPARTMENT_OTHER): Payer: Self-pay | Admitting: Physical Therapy

## 2024-04-14 DIAGNOSIS — Z96651 Presence of right artificial knee joint: Secondary | ICD-10-CM | POA: Diagnosis not present

## 2024-04-14 DIAGNOSIS — M25561 Pain in right knee: Secondary | ICD-10-CM | POA: Diagnosis not present

## 2024-04-16 DIAGNOSIS — L309 Dermatitis, unspecified: Secondary | ICD-10-CM | POA: Diagnosis not present

## 2024-04-16 DIAGNOSIS — B370 Candidal stomatitis: Secondary | ICD-10-CM | POA: Diagnosis not present

## 2024-06-13 ENCOUNTER — Ambulatory Visit (HOSPITAL_BASED_OUTPATIENT_CLINIC_OR_DEPARTMENT_OTHER): Admitting: Physical Therapy

## 2024-06-15 ENCOUNTER — Ambulatory Visit (HOSPITAL_BASED_OUTPATIENT_CLINIC_OR_DEPARTMENT_OTHER): Attending: Orthopedic Surgery | Admitting: Physical Therapy

## 2024-06-15 DIAGNOSIS — M6281 Muscle weakness (generalized): Secondary | ICD-10-CM

## 2024-06-15 DIAGNOSIS — R262 Difficulty in walking, not elsewhere classified: Secondary | ICD-10-CM

## 2024-06-15 DIAGNOSIS — M25661 Stiffness of right knee, not elsewhere classified: Secondary | ICD-10-CM

## 2024-06-15 DIAGNOSIS — M25561 Pain in right knee: Secondary | ICD-10-CM

## 2024-06-15 NOTE — Therapy (Signed)
 " OUTPATIENT PHYSICAL THERAPY LOWER EXTREMITY EVALUATION   Patient Name: Dawn Haley MRN: [id number] DOB:[birth year], 69 y.o., female Today's Date: 06/16/2024  END OF SESSION:  PT End of Session - 06/15/24 1205     Visit Number 1    Number of Visits 10    Date for Recertification  07/27/24    Authorization Type [employer] MCR    PT Start Time 1113    PT Stop Time 1208    PT Time Calculation (min) 55 min    Activity Tolerance Patient tolerated treatment well    Behavior During Therapy WFL for tasks assessed/performed          Past Medical History:  Diagnosis Date   Anxiety    Arthritis    Diverticulitis    Dyspnea    walking up hill   Dysrhythmia    PAF   Hypertension    Meniere disease of right ear    Pneumonia    Pre-diabetes    Past Surgical History:  Procedure Laterality Date   ABDOMINAL HYSTERECTOMY     APPENDECTOMY     COLON SURGERY  2022   Colectomy for colovaginal fistula & diverticulitis   CYST EXCISION Right 11/02/2015   Procedure: RIGHT INDEX EXCISION MASS ;  Surgeon: Franky Curia, MD;  Location: [facility];  Service: Orthopedics;  Laterality: Right;   CYSTOSCOPY Bilateral 11/09/2019   Procedure: CYSTOSCOPY WITH BILATERAL FIREFLY  INJECTION;  Surgeon: Devere Lonni Righter, MD;  Location: [facility];  Service: Urology;  Laterality: Bilateral;   DIAGNOSTIC LAPAROSCOPY     GYN   DILATION AND CURETTAGE OF UTERUS     TONSILLECTOMY     TOTAL KNEE ARTHROPLASTY Right 12/25/2023   Procedure: ARTHROPLASTY, KNEE, TOTAL;  Surgeon: Kay Kemps, MD;  Location: [facility];  Service: Orthopedics;  Laterality: Right;   URETHRAL DILATION     WISDOM TOOTH EXTRACTION     Patient Active Problem List   Diagnosis Date Noted   Hyperlipidemia 03/02/2024   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   Paroxysmal atrial fibrillation (HCC) 02/17/2023   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   Hypocalcemia 10/27/2019   Sigmoid diverticulitis  10/26/2019   Chest pain 06/06/2015   GERD (gastroesophageal reflux disease) 06/06/2015   Costochondritis 06/06/2015    REFERRING PROVIDER: Debby Fireman, PA-C  REFERRING DIAG: [phone] Presence of R artificial knee joint  THERAPY DIAG:  Right knee pain, unspecified chronicity  Stiffness of right knee, not elsewhere classified  Muscle weakness (generalized)  Difficulty in walking, not elsewhere classified  Rationale for Evaluation and Treatment: Rehabilitation  ONSET DATE: 04/07/24  /  DOS  12/25/23  SUBJECTIVE:   SUBJECTIVE STATEMENT: Pt has a hx of pain in bilat knees beginning summer of 2024.   Pt underwent R TKA on 12/25/23   Pt was seen in PT from  8/18 - 04/05/24.  She did well in PT and met all of her goals except household chores.  Pt states her knee felt good, felt normal for 10 days leading up to Thanksgiving.  Pt's mother in law fell into pt when she was trying to help her out of the W/C.  Pt states she didn't fall to the ground though her mother in law's full weight fell on her.  Pt had no pain immediately though had increased pain the following day.  Pt saw Dr. Kay and had x rays which looked good.  MD drained fluid off knee which showed no infection.  Pt was instructed to rest.  MD ordered PT and order indicated quad strengthening s/p total knee.  Pt c/o's of R knee stiffness.  She states she is more limited with knee ROM.  Pt c/o's of pain and tightness.  Pt reports she has difficulty sleeping.  This injury has limited her walking, and she occasionally limps.  She has to take more rest breaks with standing and activities.  She has a greater difficulty with getting up from the floor.   PERTINENT HISTORY: R TKA  12/25/23 Arthritis including L knee, A-fib, anxiety, vertigo   Pt reports having a fx in R hip 20 years ago.  PAIN:  NPRS:  2/10 current, 7/10 worst, 0/10 best  Location:  varies t/o entire R knee  Type:  can be a sharp jab, achy   PRECAUTIONS: {Therapy  precautions:24002}    WEIGHT BEARING RESTRICTIONS: No  FALLS:  Has patient fallen in last 6 months? No  LIVING ENVIRONMENT: Lives with: lives with their spouse Lives in: 1 story home Stairs: small step to enter/exit home without rail Has following equipment at home: Single point cane, Environmental Consultant - 2 wheeled, and Crutches, bedside toilet, shower stool  OCCUPATION: retired  PLOF: Independent  PATIENT GOALS:  to have no pain and for her knee to feel like it did before the injury   OBJECTIVE:  Note: Objective measures were completed at Evaluation unless otherwise noted.  DIAGNOSTIC FINDINGS:  Pt states her knee x rays looked good.  PATIENT SURVEYS:  LEFS: 53/80  COGNITION: Overall cognitive status: Within functional limits for tasks assessed      EDEMA:  Girth measurement at joint line:  Knee:  R:  43 cm, L:  41.7 cm   PALPATION: TTP medial knee  LOWER EXTREMITY STRENGTH:  MMT Right eval Left eval  Hip flexion 5/5   Hip extension    Hip abduction 4+/5   Hip adduction    Hip internal rotation    Hip external rotation    Knee flexion 5/5 seated   Knee extension 4+/5   Ankle dorsiflexion    Ankle plantarflexion    Ankle inversion    Ankle eversion     (Blank rows = not tested)  LOWER EXTREMITY ROM:  ROM Right eval Left eval  Hip flexion    Hip extension    Hip abduction    Hip adduction    Hip internal rotation    Hip external rotation    Knee flexion 122    Knee extension 1/3 PROM/AROM   Ankle dorsiflexion    Ankle plantarflexion    Ankle inversion    Ankle eversion     (Blank rows = not tested)   GAIT: Assistive device utilized: None Level of assistance: Complete Independence Comments: Pt ambulates with a mild/minimal limp lacking full knee extension on R  TREATMENT:    Reviewed current exercises.  Pt is  performing supine heel slides with strap, supine SLR, quad set with pillow under knee, SAQ, LAQ, sidestepping, bone foam extension stretch, and mini squats. Pt performed longsitting gastroc stretch 3x20 sec.  PT gave pt a HEP handout and educated pt in correct form and appropriate frequency.  PT instructed pt to not perform stretch into a painful range.   PATIENT EDUCATION:  Education details: Dx, HEP, rationale of interventions, relevant anatomy, objective findings, and prognosis.  PT answered pt's questions.  Person educated: Patient Education method: Explanation, Demonstration, Verbal cues, and Handouts Education comprehension: verbalized understanding, returned demonstration, verbal cues required, and needs further education  HOME EXERCISE PROGRAM: Prior code: [id number]  Updated: - Long Sitting Calf Stretch with Strap  - 2 x daily - 7 x weekly - 2-3 reps - 20 seconds hold  ASSESSMENT:  CLINICAL IMPRESSION: Patient is a 69 y.o. female    Pt has a hx of pain in bilat knees beginning summer of 2024.   Pt underwent R TKA on 12/25/23   Pt was seen in PT from  8/18 - 04/05/24.  She did well in PT and met all of her goals except household chores.  Pt states her knee felt good, felt normal for 10 days leading up to Thanksgiving.  Pt's mother in law fell into pt when she was trying to help her out of the W/C.  Pt states she didn't fall to the ground though her mother in law's full weight fell on her.  Pt had no pain immediately though had increased pain the following day.  Pt saw Dr. Kay and had x rays which looked good.  MD drained fluid off knee which showed no infection.  Pt was instructed to rest.  MD ordered PT and order indicated quad strengthening s/p total knee.  Pt c/o's of R knee stiffness.  She states she is more limited with knee ROM.  Pt c/o's of pain and tightness.  Pt reports she has difficulty sleeping.  This injury has limited her walking, and she occasionally limps.  She has to  take more rest breaks with standing and activities.  She has a greater difficulty with getting up from the floor.   OBJECTIVE IMPAIRMENTS: {opptimpairments:25111}.   ACTIVITY LIMITATIONS: {activitylimitations:27494}  PARTICIPATION LIMITATIONS: {participationrestrictions:25113}  PERSONAL FACTORS: {Personal factors:25162} are also affecting patient's functional outcome.   REHAB POTENTIAL: {rehabpotential:25112}  CLINICAL DECISION MAKING: {clinical decision making:25114}  EVALUATION COMPLEXITY: {Evaluation complexity:25115}   GOALS:   SHORT TERM GOALS: Target date: *** *** Baseline: Goal status: INITIAL  2.  *** Baseline:  Goal status: INITIAL  3.  *** Baseline:  Goal status: INITIAL  4.  *** Baseline:  Goal status: INITIAL  5.  *** Baseline:  Goal status: INITIAL  6.  *** Baseline:  Goal status: INITIAL  LONG TERM GOALS: Target date: ***  *** Baseline:  Goal status: INITIAL  2.  *** Baseline:  Goal status: INITIAL  3.  *** Baseline:  Goal status: INITIAL  4.  *** Baseline:  Goal status: INITIAL  5.  *** Baseline:  Goal status: INITIAL  6.  *** Baseline:  Goal status: INITIAL   PLAN:  PT FREQUENCY: 1-2x/week  PT DURATION: 6 weeks  PLANNED INTERVENTIONS: {rehab planned interventions:25118::97110-Therapeutic exercises,97530- Therapeutic [phone]- Neuromuscular re-education,97535- Self Rjmz,[id number]- Manual therapy,Patient/Family education}  PLAN FOR NEXT SESSION: PIERRETTE Mose Minerva, PT 06/16/2024, 3:44 PM  "

## 2024-06-16 ENCOUNTER — Encounter (HOSPITAL_BASED_OUTPATIENT_CLINIC_OR_DEPARTMENT_OTHER): Payer: Self-pay | Admitting: Physical Therapy

## 2024-06-16 ENCOUNTER — Other Ambulatory Visit: Payer: Self-pay

## 2024-06-20 ENCOUNTER — Ambulatory Visit (HOSPITAL_BASED_OUTPATIENT_CLINIC_OR_DEPARTMENT_OTHER): Admitting: Physical Therapy

## 2024-06-23 ENCOUNTER — Encounter (HOSPITAL_BASED_OUTPATIENT_CLINIC_OR_DEPARTMENT_OTHER): Admitting: Physical Therapy

## 2024-06-27 ENCOUNTER — Encounter (HOSPITAL_BASED_OUTPATIENT_CLINIC_OR_DEPARTMENT_OTHER): Admitting: Physical Therapy

## 2024-06-30 ENCOUNTER — Encounter (HOSPITAL_BASED_OUTPATIENT_CLINIC_OR_DEPARTMENT_OTHER): Admitting: Physical Therapy

## 2024-07-04 ENCOUNTER — Encounter (HOSPITAL_BASED_OUTPATIENT_CLINIC_OR_DEPARTMENT_OTHER): Admitting: Physical Therapy

## 2024-07-07 ENCOUNTER — Encounter (HOSPITAL_BASED_OUTPATIENT_CLINIC_OR_DEPARTMENT_OTHER): Admitting: Physical Therapy

## 2024-07-12 ENCOUNTER — Encounter (HOSPITAL_BASED_OUTPATIENT_CLINIC_OR_DEPARTMENT_OTHER): Admitting: Physical Therapy

## 2024-07-14 ENCOUNTER — Encounter (HOSPITAL_BASED_OUTPATIENT_CLINIC_OR_DEPARTMENT_OTHER): Admitting: Physical Therapy

## 2024-07-19 ENCOUNTER — Encounter (HOSPITAL_BASED_OUTPATIENT_CLINIC_OR_DEPARTMENT_OTHER): Admitting: Physical Therapy

## 2024-07-21 ENCOUNTER — Encounter (HOSPITAL_BASED_OUTPATIENT_CLINIC_OR_DEPARTMENT_OTHER): Admitting: Physical Therapy

## 2024-07-26 ENCOUNTER — Encounter (HOSPITAL_BASED_OUTPATIENT_CLINIC_OR_DEPARTMENT_OTHER): Admitting: Physical Therapy

## 2024-07-28 ENCOUNTER — Encounter (HOSPITAL_BASED_OUTPATIENT_CLINIC_OR_DEPARTMENT_OTHER): Admitting: Physical Therapy

## 2024-08-02 ENCOUNTER — Encounter (HOSPITAL_BASED_OUTPATIENT_CLINIC_OR_DEPARTMENT_OTHER): Admitting: Physical Therapy

## 2024-08-04 ENCOUNTER — Encounter (HOSPITAL_BASED_OUTPATIENT_CLINIC_OR_DEPARTMENT_OTHER): Admitting: Physical Therapy

## 2024-08-09 ENCOUNTER — Encounter (HOSPITAL_BASED_OUTPATIENT_CLINIC_OR_DEPARTMENT_OTHER): Admitting: Physical Therapy

## 2024-08-11 ENCOUNTER — Encounter (HOSPITAL_BASED_OUTPATIENT_CLINIC_OR_DEPARTMENT_OTHER): Admitting: Physical Therapy
